# Patient Record
Sex: Male | Born: 1960 | Race: White | Hispanic: No | Smoking: Current every day smoker
Health system: Southern US, Community
[De-identification: ages and names within clinical notes are randomized; demographics above are authoritative.]

## PROBLEM LIST (undated history)

## (undated) DIAGNOSIS — S0990XA Unspecified injury of head, initial encounter: Secondary | ICD-10-CM

## (undated) DIAGNOSIS — E039 Hypothyroidism, unspecified: Secondary | ICD-10-CM

## (undated) DIAGNOSIS — J449 Chronic obstructive pulmonary disease, unspecified: Secondary | ICD-10-CM

## (undated) DIAGNOSIS — N400 Enlarged prostate without lower urinary tract symptoms: Secondary | ICD-10-CM

## (undated) DIAGNOSIS — R569 Unspecified convulsions: Secondary | ICD-10-CM

## (undated) DIAGNOSIS — B1920 Unspecified viral hepatitis C without hepatic coma: Secondary | ICD-10-CM

## (undated) DIAGNOSIS — F419 Anxiety disorder, unspecified: Secondary | ICD-10-CM

## (undated) DIAGNOSIS — F329 Major depressive disorder, single episode, unspecified: Secondary | ICD-10-CM

## (undated) DIAGNOSIS — F32A Depression, unspecified: Secondary | ICD-10-CM

## (undated) DIAGNOSIS — K861 Other chronic pancreatitis: Secondary | ICD-10-CM

## (undated) DIAGNOSIS — K7460 Unspecified cirrhosis of liver: Secondary | ICD-10-CM

## (undated) DIAGNOSIS — R0602 Shortness of breath: Secondary | ICD-10-CM

## (undated) DIAGNOSIS — IMO0002 Reserved for concepts with insufficient information to code with codable children: Secondary | ICD-10-CM

## (undated) DIAGNOSIS — R0902 Hypoxemia: Secondary | ICD-10-CM

## (undated) DIAGNOSIS — K219 Gastro-esophageal reflux disease without esophagitis: Secondary | ICD-10-CM

## (undated) DIAGNOSIS — F1010 Alcohol abuse, uncomplicated: Secondary | ICD-10-CM

## (undated) DIAGNOSIS — F319 Bipolar disorder, unspecified: Secondary | ICD-10-CM

## (undated) HISTORY — PX: APPENDECTOMY: SHX54

## (undated) HISTORY — PX: HERNIA REPAIR: SHX51

## (undated) HISTORY — PX: TOTAL HIP ARTHROPLASTY: SHX124

## (undated) HISTORY — DX: Unspecified viral hepatitis C without hepatic coma: B19.20

## (undated) HISTORY — DX: Alcohol abuse, uncomplicated: F10.10

## (undated) HISTORY — PX: FEMUR FRACTURE SURGERY: SHX633

## (undated) SURGERY — OPEN REDUCTION INTERNAL FIXATION (ORIF) TIBIAL PLATEAU
Anesthesia: General | Laterality: Left

---

## 2001-02-24 ENCOUNTER — Other Ambulatory Visit: Admission: RE | Admit: 2001-02-24 | Discharge: 2001-02-24 | Payer: Self-pay | Admitting: Urology

## 2001-04-27 ENCOUNTER — Ambulatory Visit (HOSPITAL_COMMUNITY): Admission: RE | Admit: 2001-04-27 | Discharge: 2001-04-27 | Payer: Self-pay | Admitting: Pulmonary Disease

## 2001-07-13 ENCOUNTER — Emergency Department (HOSPITAL_COMMUNITY): Admission: EM | Admit: 2001-07-13 | Discharge: 2001-07-14 | Payer: Self-pay | Admitting: Emergency Medicine

## 2001-07-14 ENCOUNTER — Encounter: Payer: Self-pay | Admitting: Emergency Medicine

## 2001-10-14 ENCOUNTER — Ambulatory Visit (HOSPITAL_COMMUNITY): Admission: RE | Admit: 2001-10-14 | Discharge: 2001-10-14 | Payer: Self-pay | Admitting: Pulmonary Disease

## 2001-10-19 ENCOUNTER — Ambulatory Visit (HOSPITAL_COMMUNITY): Admission: RE | Admit: 2001-10-19 | Discharge: 2001-10-19 | Payer: Self-pay | Admitting: Pulmonary Disease

## 2002-01-30 ENCOUNTER — Encounter: Payer: Self-pay | Admitting: Emergency Medicine

## 2002-01-30 ENCOUNTER — Inpatient Hospital Stay (HOSPITAL_COMMUNITY): Admission: EM | Admit: 2002-01-30 | Discharge: 2002-01-30 | Payer: Self-pay | Admitting: Emergency Medicine

## 2002-05-26 ENCOUNTER — Emergency Department (HOSPITAL_COMMUNITY): Admission: EM | Admit: 2002-05-26 | Discharge: 2002-05-26 | Payer: Self-pay | Admitting: *Deleted

## 2002-05-30 ENCOUNTER — Encounter: Payer: Self-pay | Admitting: Internal Medicine

## 2002-05-31 ENCOUNTER — Inpatient Hospital Stay (HOSPITAL_COMMUNITY): Admission: EM | Admit: 2002-05-31 | Discharge: 2002-06-01 | Payer: Self-pay | Admitting: Internal Medicine

## 2002-06-08 ENCOUNTER — Inpatient Hospital Stay (HOSPITAL_COMMUNITY): Admission: EM | Admit: 2002-06-08 | Discharge: 2002-06-09 | Payer: Self-pay | Admitting: *Deleted

## 2002-06-08 ENCOUNTER — Encounter: Payer: Self-pay | Admitting: Emergency Medicine

## 2002-09-17 ENCOUNTER — Emergency Department (HOSPITAL_COMMUNITY): Admission: EM | Admit: 2002-09-17 | Discharge: 2002-09-18 | Payer: Self-pay | Admitting: *Deleted

## 2002-09-18 ENCOUNTER — Encounter: Payer: Self-pay | Admitting: *Deleted

## 2002-12-14 ENCOUNTER — Encounter: Payer: Self-pay | Admitting: Emergency Medicine

## 2002-12-14 ENCOUNTER — Emergency Department (HOSPITAL_COMMUNITY): Admission: EM | Admit: 2002-12-14 | Discharge: 2002-12-14 | Payer: Self-pay | Admitting: Emergency Medicine

## 2003-07-01 ENCOUNTER — Ambulatory Visit (HOSPITAL_COMMUNITY): Admission: RE | Admit: 2003-07-01 | Discharge: 2003-07-01 | Payer: Self-pay | Admitting: General Surgery

## 2003-08-11 ENCOUNTER — Ambulatory Visit (HOSPITAL_COMMUNITY): Admission: RE | Admit: 2003-08-11 | Discharge: 2003-08-11 | Payer: Self-pay | Admitting: Pulmonary Disease

## 2003-12-08 ENCOUNTER — Other Ambulatory Visit: Admission: RE | Admit: 2003-12-08 | Discharge: 2003-12-08 | Payer: Self-pay | Admitting: Urology

## 2003-12-17 ENCOUNTER — Emergency Department (HOSPITAL_COMMUNITY): Admission: EM | Admit: 2003-12-17 | Discharge: 2003-12-17 | Payer: Self-pay | Admitting: Emergency Medicine

## 2004-03-16 ENCOUNTER — Ambulatory Visit (HOSPITAL_COMMUNITY): Admission: RE | Admit: 2004-03-16 | Discharge: 2004-03-16 | Payer: Self-pay | Admitting: General Surgery

## 2004-10-08 ENCOUNTER — Ambulatory Visit (HOSPITAL_COMMUNITY): Admission: RE | Admit: 2004-10-08 | Discharge: 2004-10-08 | Payer: Self-pay | Admitting: Pulmonary Disease

## 2005-06-27 ENCOUNTER — Ambulatory Visit (HOSPITAL_COMMUNITY): Admission: RE | Admit: 2005-06-27 | Discharge: 2005-06-27 | Payer: Self-pay | Admitting: Pulmonary Disease

## 2005-07-19 ENCOUNTER — Emergency Department (HOSPITAL_COMMUNITY): Admission: EM | Admit: 2005-07-19 | Discharge: 2005-07-19 | Payer: Self-pay | Admitting: Emergency Medicine

## 2006-01-02 ENCOUNTER — Ambulatory Visit (HOSPITAL_COMMUNITY): Admission: RE | Admit: 2006-01-02 | Discharge: 2006-01-02 | Payer: Self-pay | Admitting: Pulmonary Disease

## 2006-11-06 ENCOUNTER — Emergency Department (HOSPITAL_COMMUNITY): Admission: EM | Admit: 2006-11-06 | Discharge: 2006-11-06 | Payer: Self-pay | Admitting: Emergency Medicine

## 2006-11-11 ENCOUNTER — Ambulatory Visit (HOSPITAL_COMMUNITY): Admission: RE | Admit: 2006-11-11 | Discharge: 2006-11-11 | Payer: Self-pay | Admitting: Pulmonary Disease

## 2006-11-16 ENCOUNTER — Emergency Department (HOSPITAL_COMMUNITY): Admission: EM | Admit: 2006-11-16 | Discharge: 2006-11-16 | Payer: Self-pay | Admitting: Emergency Medicine

## 2006-11-30 ENCOUNTER — Emergency Department (HOSPITAL_COMMUNITY): Admission: EM | Admit: 2006-11-30 | Discharge: 2006-11-30 | Payer: Self-pay | Admitting: Emergency Medicine

## 2006-12-25 ENCOUNTER — Ambulatory Visit (HOSPITAL_COMMUNITY): Admission: RE | Admit: 2006-12-25 | Discharge: 2006-12-25 | Payer: Self-pay | Admitting: Internal Medicine

## 2007-01-26 ENCOUNTER — Emergency Department (HOSPITAL_COMMUNITY): Admission: EM | Admit: 2007-01-26 | Discharge: 2007-01-26 | Payer: Self-pay | Admitting: Emergency Medicine

## 2007-02-11 ENCOUNTER — Ambulatory Visit: Payer: Self-pay | Admitting: Internal Medicine

## 2007-02-16 ENCOUNTER — Ambulatory Visit: Payer: Self-pay | Admitting: Internal Medicine

## 2007-02-16 ENCOUNTER — Ambulatory Visit (HOSPITAL_COMMUNITY): Admission: RE | Admit: 2007-02-16 | Discharge: 2007-02-16 | Payer: Self-pay | Admitting: Internal Medicine

## 2007-02-22 ENCOUNTER — Emergency Department (HOSPITAL_COMMUNITY): Admission: EM | Admit: 2007-02-22 | Discharge: 2007-02-22 | Payer: Self-pay | Admitting: Emergency Medicine

## 2007-02-23 ENCOUNTER — Emergency Department (HOSPITAL_COMMUNITY): Admission: EM | Admit: 2007-02-23 | Discharge: 2007-02-23 | Payer: Self-pay | Admitting: Emergency Medicine

## 2007-03-10 ENCOUNTER — Ambulatory Visit: Payer: Self-pay | Admitting: Internal Medicine

## 2007-03-30 ENCOUNTER — Inpatient Hospital Stay (HOSPITAL_COMMUNITY): Admission: RE | Admit: 2007-03-30 | Discharge: 2007-04-04 | Payer: Self-pay | Admitting: Orthopedic Surgery

## 2007-04-11 ENCOUNTER — Inpatient Hospital Stay (HOSPITAL_COMMUNITY): Admission: EM | Admit: 2007-04-11 | Discharge: 2007-04-15 | Payer: Self-pay | Admitting: Emergency Medicine

## 2007-04-13 ENCOUNTER — Ambulatory Visit: Payer: Self-pay | Admitting: Vascular Surgery

## 2007-04-13 ENCOUNTER — Encounter (INDEPENDENT_AMBULATORY_CARE_PROVIDER_SITE_OTHER): Payer: Self-pay | Admitting: Internal Medicine

## 2007-04-17 ENCOUNTER — Encounter: Payer: Self-pay | Admitting: Emergency Medicine

## 2007-04-17 ENCOUNTER — Inpatient Hospital Stay (HOSPITAL_COMMUNITY): Admission: EM | Admit: 2007-04-17 | Discharge: 2007-04-19 | Payer: Self-pay | Admitting: Emergency Medicine

## 2007-04-18 ENCOUNTER — Encounter (INDEPENDENT_AMBULATORY_CARE_PROVIDER_SITE_OTHER): Payer: Self-pay | Admitting: Specialist

## 2007-05-01 ENCOUNTER — Inpatient Hospital Stay (HOSPITAL_COMMUNITY): Admission: EM | Admit: 2007-05-01 | Discharge: 2007-05-01 | Payer: Self-pay | Admitting: Emergency Medicine

## 2007-05-28 ENCOUNTER — Observation Stay (HOSPITAL_COMMUNITY): Admission: EM | Admit: 2007-05-28 | Discharge: 2007-05-29 | Payer: Self-pay | Admitting: Emergency Medicine

## 2007-06-11 ENCOUNTER — Ambulatory Visit: Payer: Self-pay | Admitting: Internal Medicine

## 2007-08-07 ENCOUNTER — Ambulatory Visit (HOSPITAL_COMMUNITY): Admission: RE | Admit: 2007-08-07 | Discharge: 2007-08-07 | Payer: Self-pay | Admitting: Pulmonary Disease

## 2007-08-17 ENCOUNTER — Inpatient Hospital Stay (HOSPITAL_COMMUNITY): Admission: EM | Admit: 2007-08-17 | Discharge: 2007-08-18 | Payer: Self-pay | Admitting: Emergency Medicine

## 2007-08-19 ENCOUNTER — Inpatient Hospital Stay (HOSPITAL_COMMUNITY): Admission: EM | Admit: 2007-08-19 | Discharge: 2007-08-25 | Payer: Self-pay | Admitting: Emergency Medicine

## 2007-12-09 ENCOUNTER — Ambulatory Visit (HOSPITAL_COMMUNITY): Admission: RE | Admit: 2007-12-09 | Discharge: 2007-12-09 | Payer: Self-pay | Admitting: Internal Medicine

## 2007-12-30 ENCOUNTER — Inpatient Hospital Stay (HOSPITAL_COMMUNITY): Admission: AD | Admit: 2007-12-30 | Discharge: 2008-01-03 | Payer: Self-pay | Admitting: Pulmonary Disease

## 2007-12-30 ENCOUNTER — Other Ambulatory Visit: Payer: Self-pay | Admitting: General Surgery

## 2008-01-01 ENCOUNTER — Encounter (INDEPENDENT_AMBULATORY_CARE_PROVIDER_SITE_OTHER): Payer: Self-pay | Admitting: General Surgery

## 2008-01-18 ENCOUNTER — Observation Stay (HOSPITAL_COMMUNITY): Admission: EM | Admit: 2008-01-18 | Discharge: 2008-01-19 | Payer: Self-pay | Admitting: Emergency Medicine

## 2008-01-19 ENCOUNTER — Ambulatory Visit: Payer: Self-pay | Admitting: Gastroenterology

## 2008-01-25 ENCOUNTER — Ambulatory Visit: Payer: Self-pay | Admitting: Internal Medicine

## 2008-02-15 ENCOUNTER — Ambulatory Visit: Payer: Self-pay | Admitting: Gastroenterology

## 2008-02-16 ENCOUNTER — Ambulatory Visit (HOSPITAL_COMMUNITY): Admission: RE | Admit: 2008-02-16 | Discharge: 2008-02-16 | Payer: Self-pay | Admitting: Internal Medicine

## 2008-02-17 ENCOUNTER — Encounter (HOSPITAL_COMMUNITY): Admission: RE | Admit: 2008-02-17 | Discharge: 2008-03-18 | Payer: Self-pay | Admitting: Pulmonary Disease

## 2008-03-08 ENCOUNTER — Ambulatory Visit (HOSPITAL_COMMUNITY): Admission: RE | Admit: 2008-03-08 | Discharge: 2008-03-08 | Payer: Self-pay | Admitting: Internal Medicine

## 2008-03-09 ENCOUNTER — Encounter: Payer: Self-pay | Admitting: Internal Medicine

## 2008-03-09 ENCOUNTER — Ambulatory Visit (HOSPITAL_COMMUNITY): Admission: RE | Admit: 2008-03-09 | Discharge: 2008-03-09 | Payer: Self-pay | Admitting: Internal Medicine

## 2008-03-09 ENCOUNTER — Ambulatory Visit: Payer: Self-pay | Admitting: Internal Medicine

## 2008-03-14 ENCOUNTER — Encounter: Payer: Self-pay | Admitting: Gastroenterology

## 2008-03-14 DIAGNOSIS — K861 Other chronic pancreatitis: Secondary | ICD-10-CM | POA: Insufficient documentation

## 2008-04-07 ENCOUNTER — Ambulatory Visit: Payer: Self-pay | Admitting: Gastroenterology

## 2008-04-07 ENCOUNTER — Ambulatory Visit (HOSPITAL_COMMUNITY): Admission: RE | Admit: 2008-04-07 | Discharge: 2008-04-07 | Payer: Self-pay | Admitting: Gastroenterology

## 2008-04-26 ENCOUNTER — Emergency Department (HOSPITAL_COMMUNITY): Admission: EM | Admit: 2008-04-26 | Discharge: 2008-04-26 | Payer: Self-pay | Admitting: Emergency Medicine

## 2008-05-06 ENCOUNTER — Encounter: Payer: Self-pay | Admitting: Gastroenterology

## 2008-05-11 ENCOUNTER — Encounter: Payer: Self-pay | Admitting: Gastroenterology

## 2008-05-11 ENCOUNTER — Ambulatory Visit: Payer: Self-pay | Admitting: Internal Medicine

## 2008-05-11 LAB — CONVERTED CEMR LAB
ALT: 29 units/L (ref 0–53)
Albumin: 3.8 g/dL (ref 3.5–5.2)
CO2: 27 meq/L (ref 19–32)
Calcium: 9.6 mg/dL (ref 8.4–10.5)
Chloride: 99 meq/L (ref 96–112)
Glucose, Bld: 112 mg/dL — ABNORMAL HIGH (ref 70–99)
Potassium: 4.2 meq/L (ref 3.5–5.3)
Sodium: 138 meq/L (ref 135–145)
TSH: 0.125 microintl units/mL — ABNORMAL LOW (ref 0.350–4.50)
Total Bilirubin: 0.6 mg/dL (ref 0.3–1.2)
Total Protein: 7.2 g/dL (ref 6.0–8.3)

## 2008-05-12 ENCOUNTER — Encounter: Payer: Self-pay | Admitting: Gastroenterology

## 2008-05-12 LAB — CONVERTED CEMR LAB: Free T4: 1.46 ng/dL (ref 0.89–1.80)

## 2008-06-21 ENCOUNTER — Ambulatory Visit (HOSPITAL_COMMUNITY): Admission: RE | Admit: 2008-06-21 | Discharge: 2008-06-21 | Payer: Self-pay | Admitting: Pulmonary Disease

## 2008-08-11 ENCOUNTER — Ambulatory Visit (HOSPITAL_COMMUNITY): Admission: RE | Admit: 2008-08-11 | Discharge: 2008-08-11 | Payer: Self-pay | Admitting: General Surgery

## 2009-02-28 IMAGING — US US ABDOMEN COMPLETE
1 series · 14 of 25 positions shown · non-contrast
Comparison: none

HISTORY: Cirrhosis, nausea, vomiting

ULTRASOUND ABDOMEN COMPLETE:
TECHNIQUE: Complete abdominal ultrasound examination was performed including
evaluation of the liver, gallbladder, bile ducts, pancreas, kidneys, spleen,
IVC, and abdominal aorta.

[Series 1: us abdomen complete · 0.32mm/px · 14 of 83 slices shown]
[im 1/83]
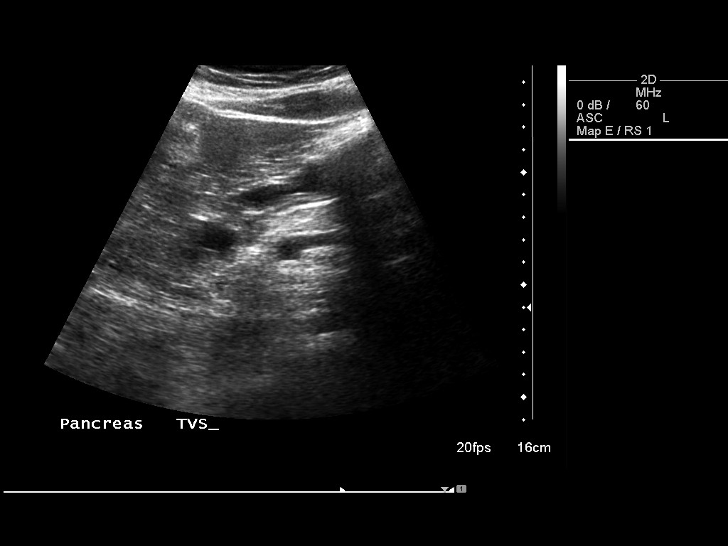
[im 7/83]
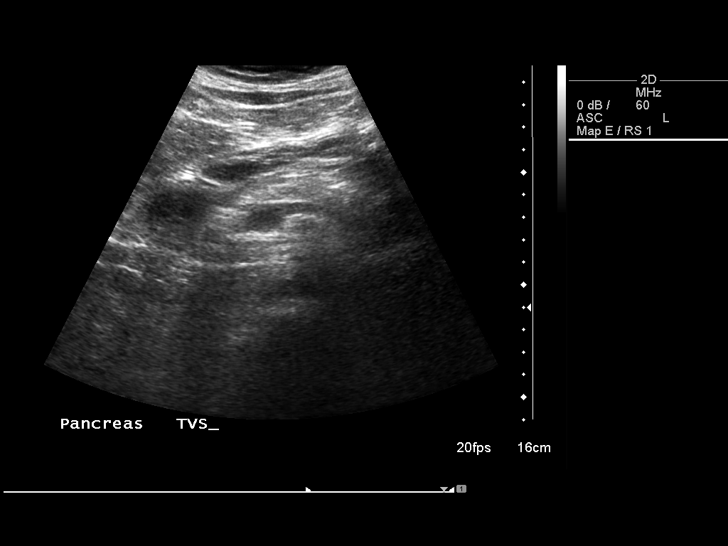
[im 14/83]
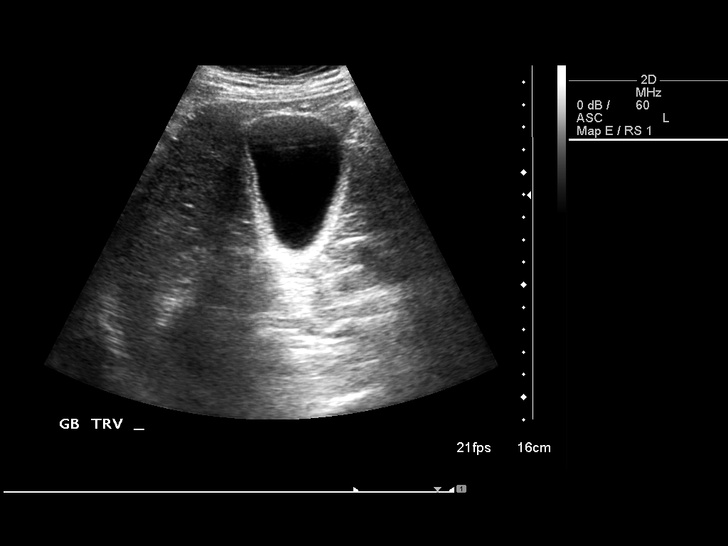
[im 21/83]
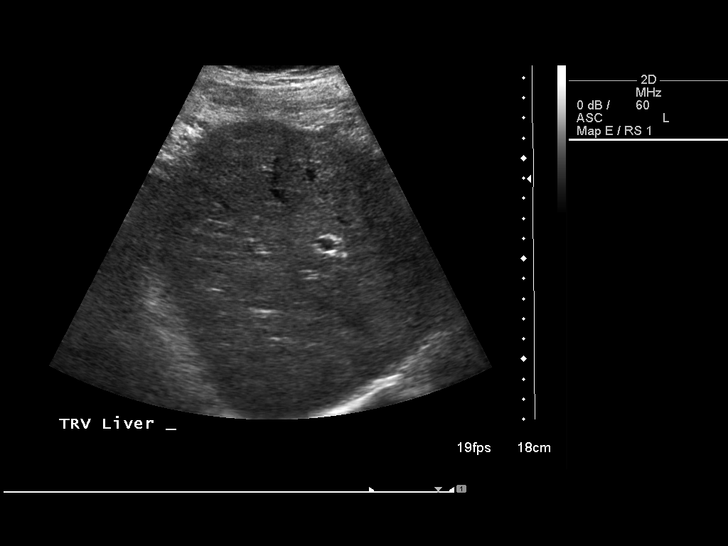
[im 28/83]
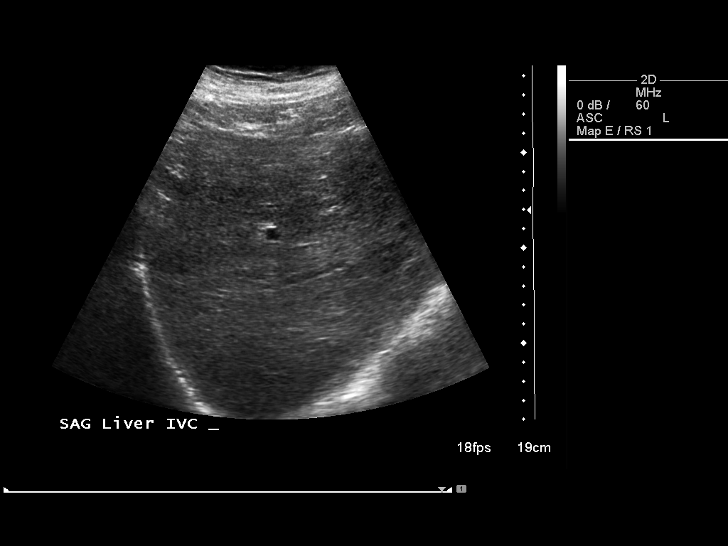
[im 31/83]
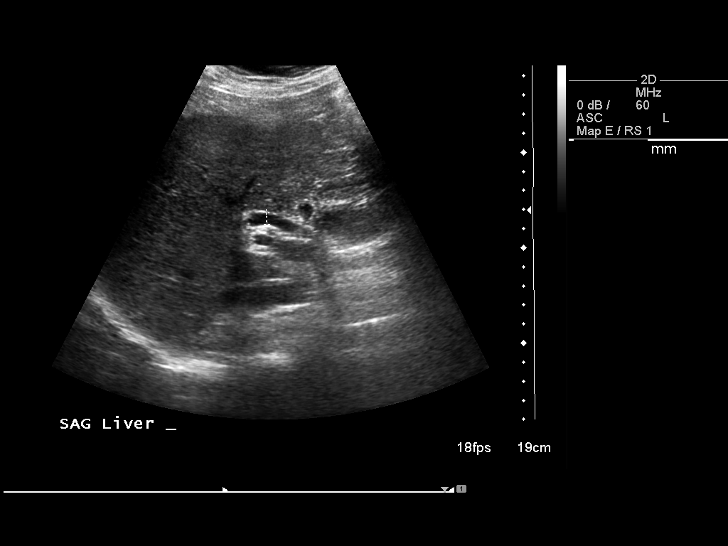
[im 38/83]
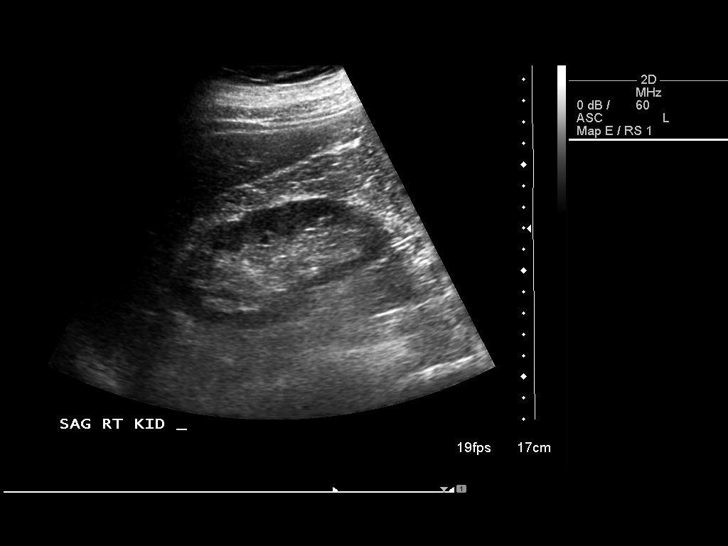
[im 45/83]
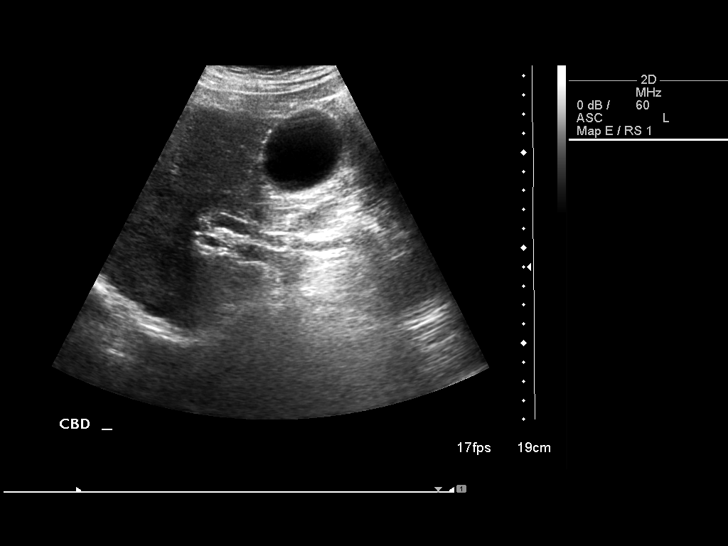
[im 52/83]
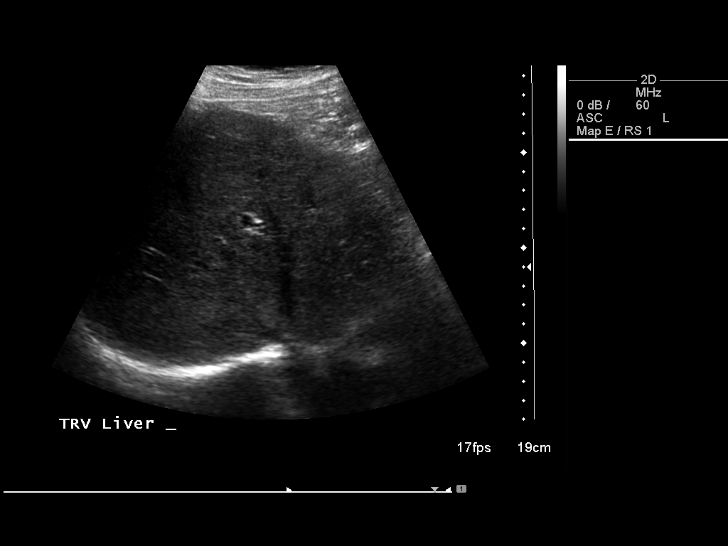
[im 55/83]
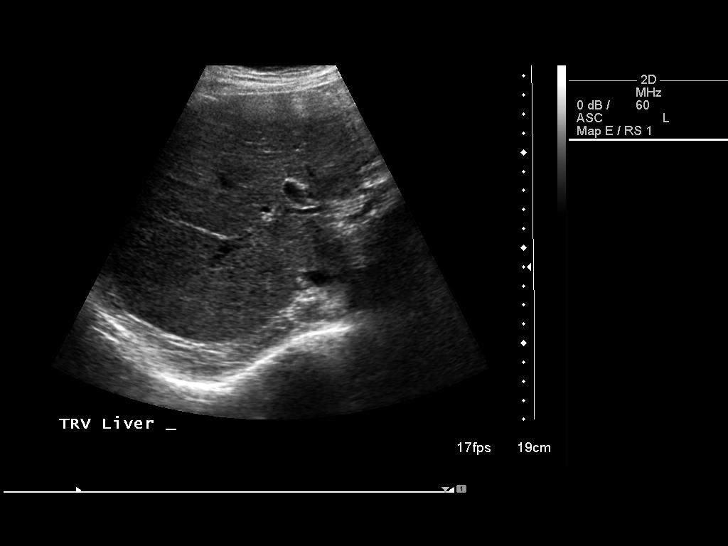
[im 62/83]
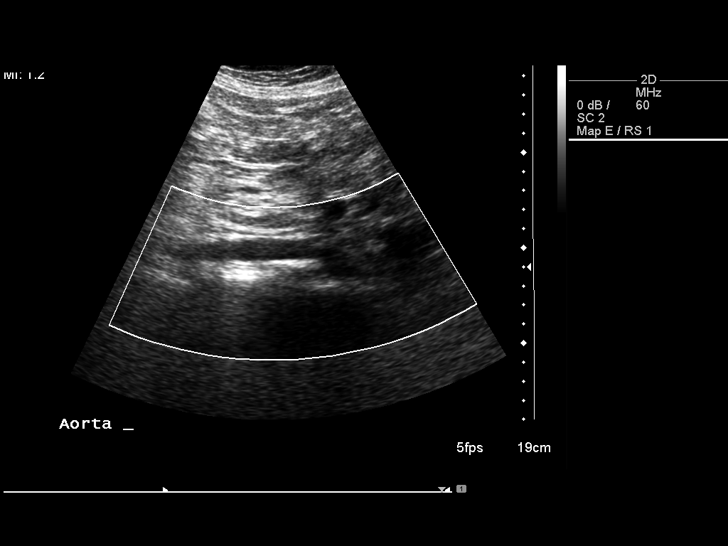
[im 69/83]
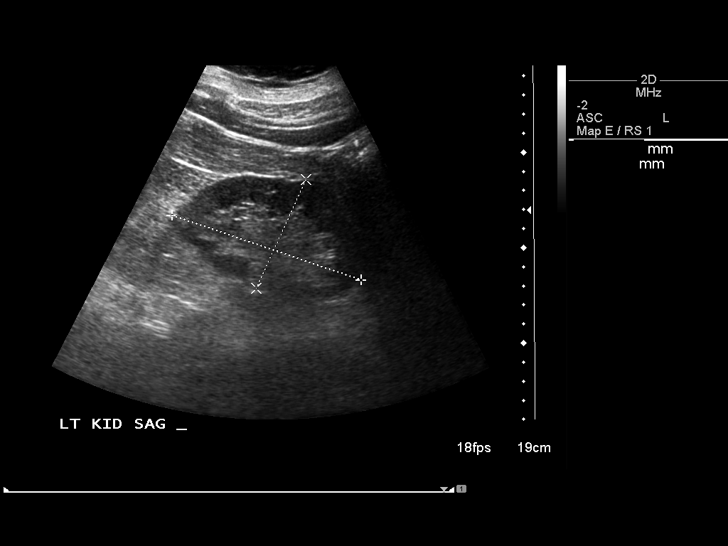
[im 76/83]
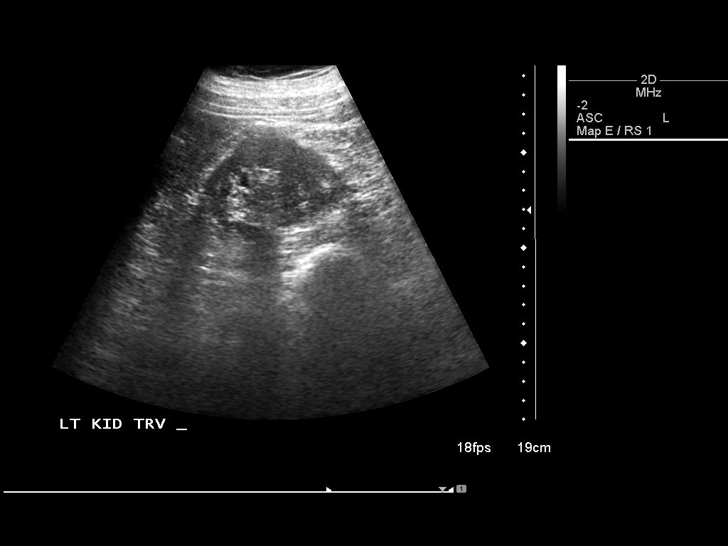
[im 83/83]
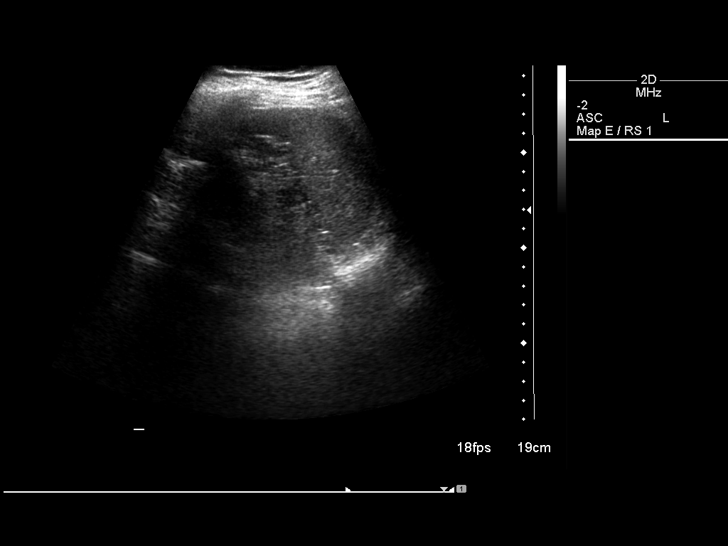

[14 of 25 positions shown; findings below may reference images not displayed]

Shadowing gallstone in gallbladder.
Gallbladder wall minimally prominent at 3 to 4 mm diameter.
No sonographic Murphy sign or definite pericholecystic fluid.
Common bile duct dilated at 8 mm diameter.
Minimal central intrahepatic biliary ductal dilatation.
Remainder of liver, pancreas, and spleen normal appearance, spleen 8.5 cm
length.
Kidneys normal size and morphology, 10.2 cm length right and 10.4 cm length
left.
Aorta and IVC normal.
No free fluid.
IMPRESSION: Cholelithiasis and minimal gallbladder wall prominence, without definite signs
of acute cholecystitis.
Mild extrahepatic and central intrahepatic biliary dilatation, recommend
correlation with LFTs.

## 2009-04-05 IMAGING — CR DG HIP COMPLETE 2+V*R*
4 series · 4 of 4 positions shown · non-contrast
Comparison: none

CLINICAL DATA: Preop total hip, osteoarthritis.
 RIGHT HIP - 4 VIEW:

[t pelvis a.p. (1 of 2)]
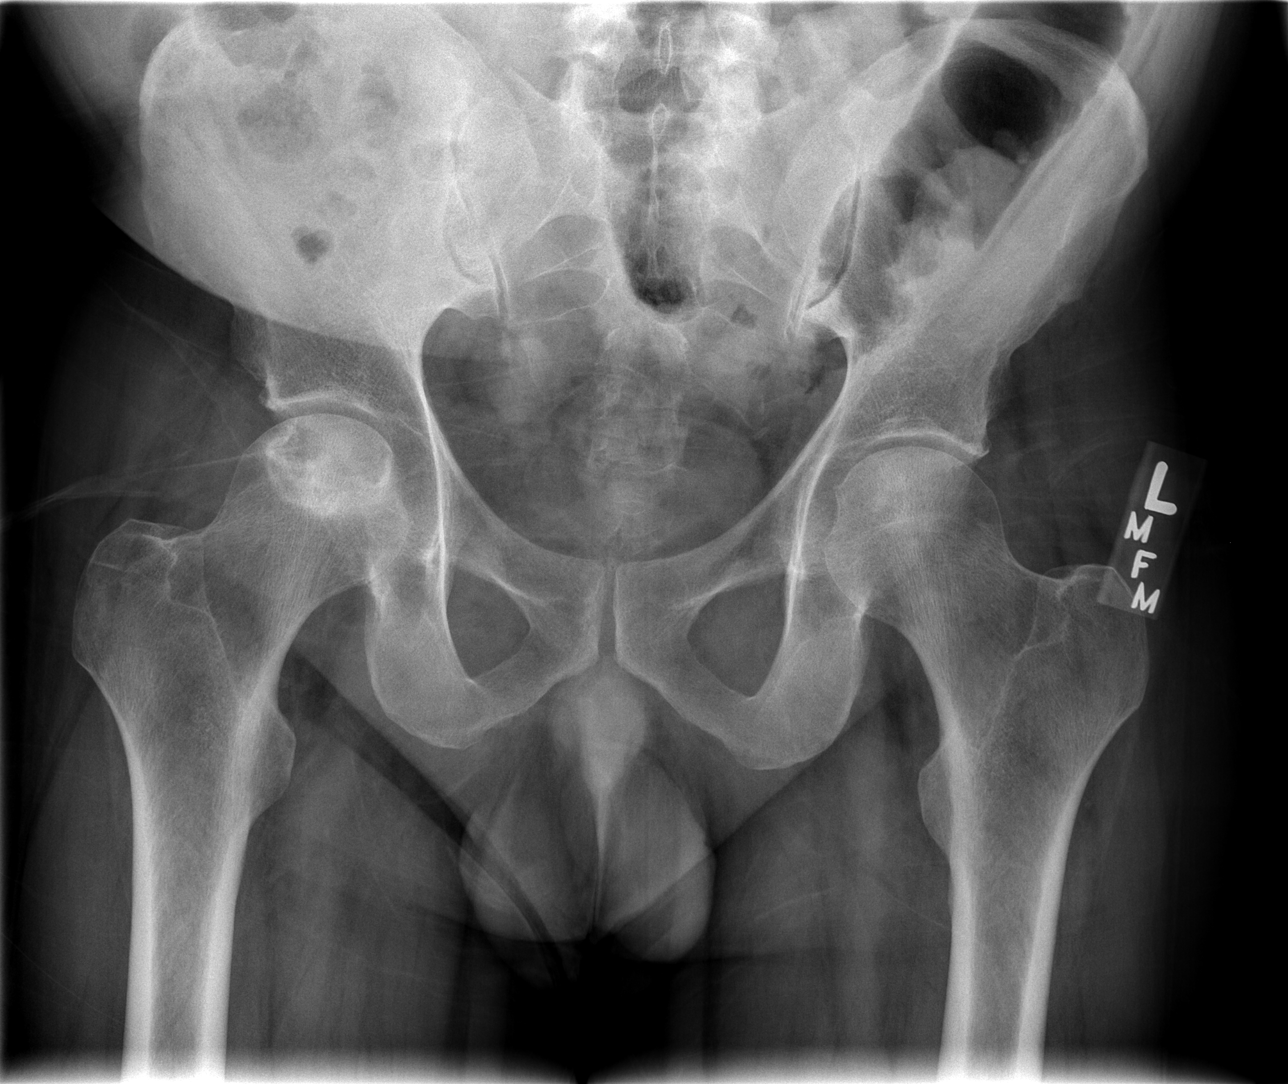

[t pelvis a.p. (2 of 2)]
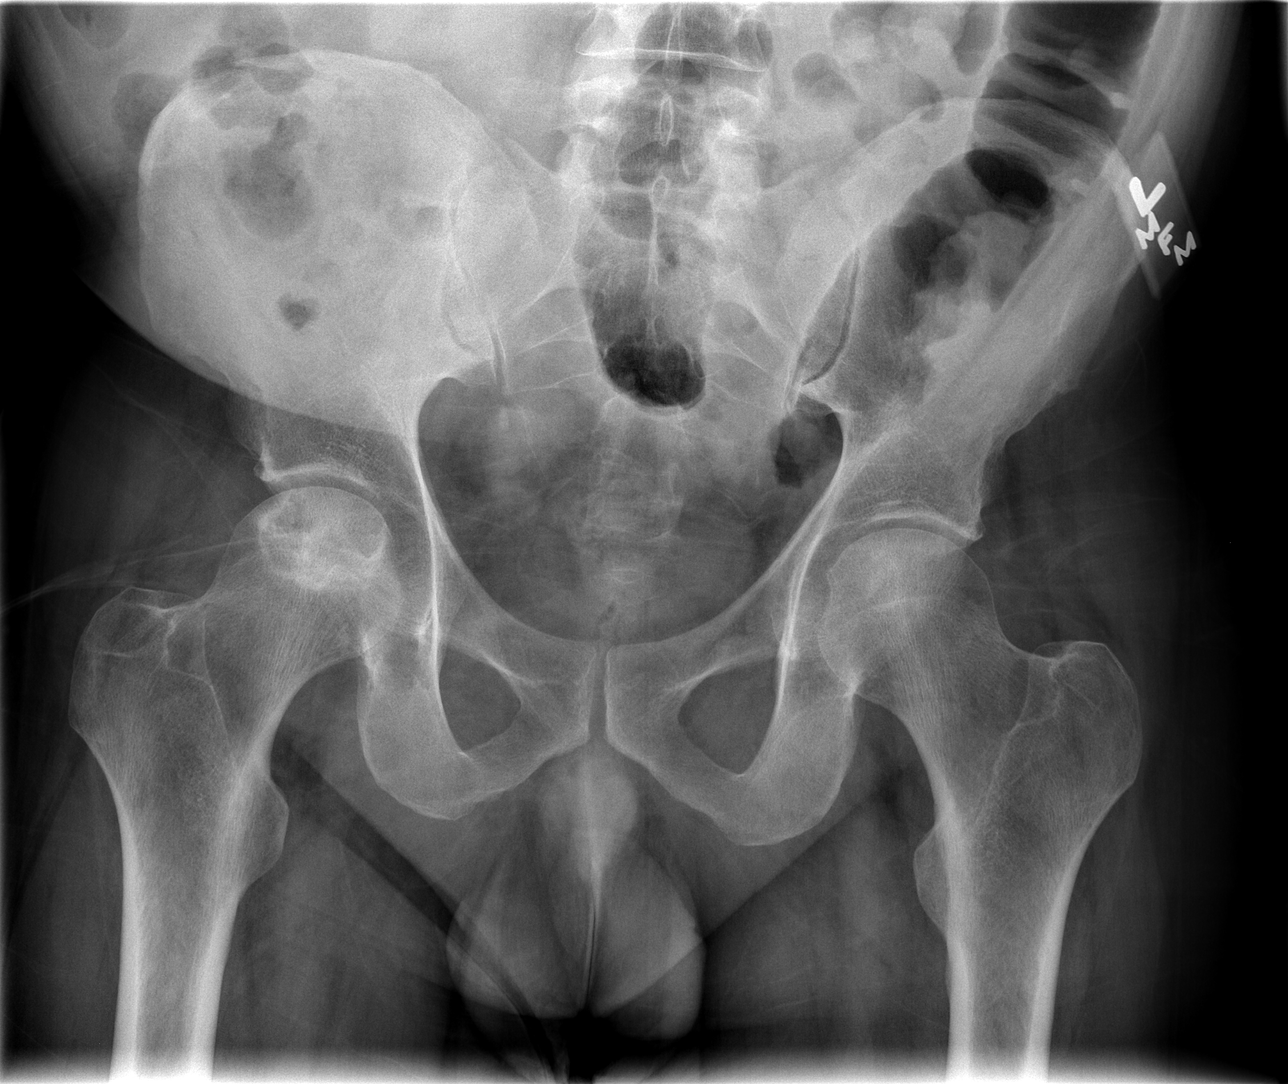

[t hip ap right]
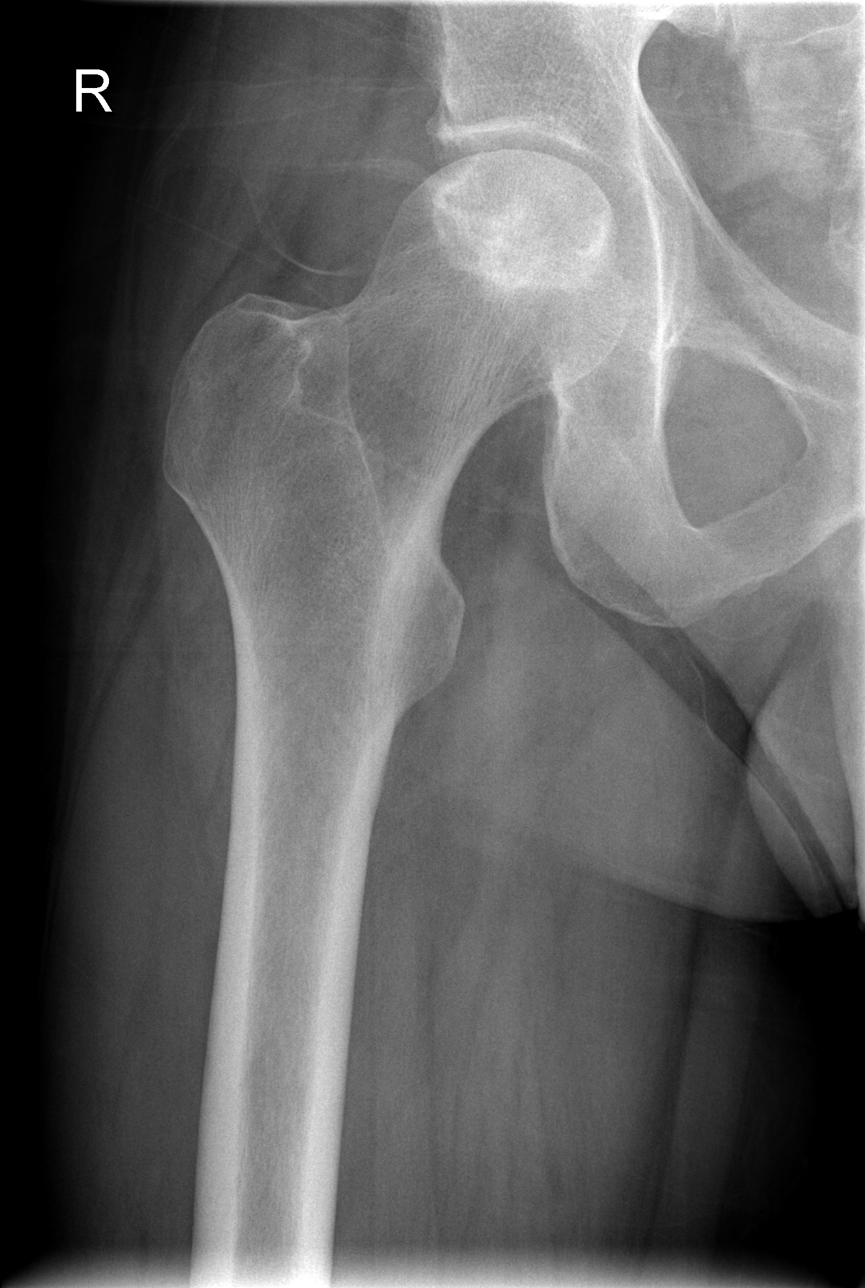

[t hip frog leg right]
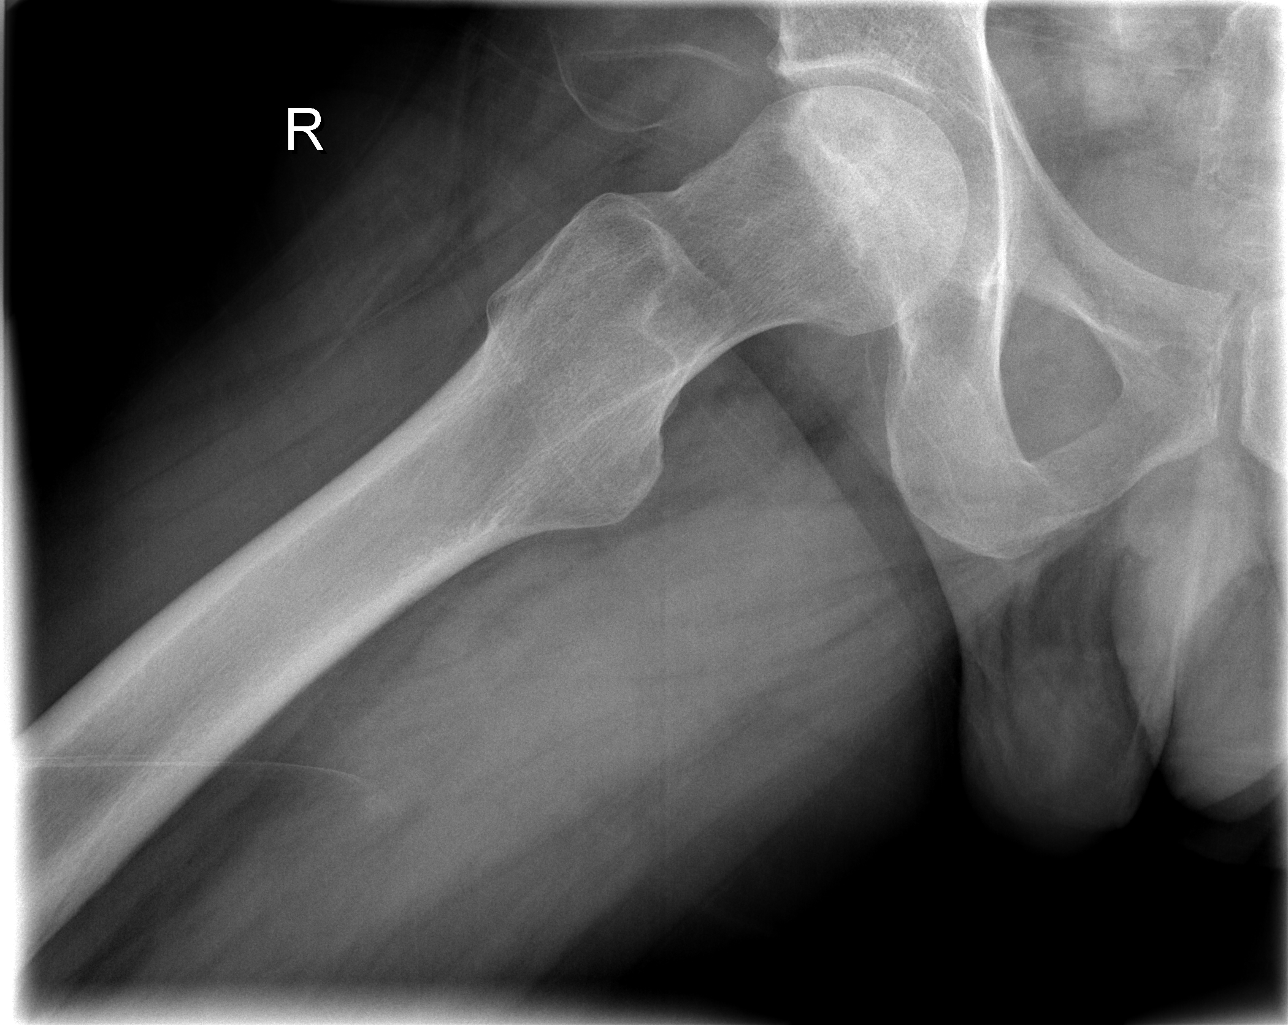

[4 of 4 positions shown; findings below may reference images not displayed]

FINDINGS: There is increased density and slight flattening of the right femoral head consistent with avascular necrosis.  Joint space is fairly well preserved.  Left hip appears normal.  Bone of the pelvis are unremarkable.
IMPRESSION: Findings consistent with advanced avascular necrosis right hip.

## 2009-06-13 ENCOUNTER — Ambulatory Visit (HOSPITAL_COMMUNITY): Admission: RE | Admit: 2009-06-13 | Discharge: 2009-06-13 | Payer: Self-pay | Admitting: Pulmonary Disease

## 2010-02-06 ENCOUNTER — Other Ambulatory Visit
Admission: RE | Admit: 2010-02-06 | Discharge: 2010-02-06 | Payer: Self-pay | Source: Home / Self Care | Admitting: Urology

## 2010-07-03 ENCOUNTER — Ambulatory Visit (HOSPITAL_COMMUNITY)
Admission: RE | Admit: 2010-07-03 | Discharge: 2010-07-03 | Payer: Self-pay | Source: Home / Self Care | Attending: Pulmonary Disease | Admitting: Pulmonary Disease

## 2010-07-10 ENCOUNTER — Ambulatory Visit (HOSPITAL_COMMUNITY)
Admission: RE | Admit: 2010-07-10 | Discharge: 2010-07-10 | Payer: Self-pay | Source: Home / Self Care | Attending: Urology | Admitting: Urology

## 2010-07-11 LAB — CREATININE, SERUM
Creatinine, Ser: 0.87 mg/dL (ref 0.4–1.5)
GFR calc Af Amer: >60 mL/min (ref >60)
GFR calc non Af Amer: >60 mL/min (ref >60)

## 2010-07-11 LAB — BUN: BUN: 3 mg/dL — ABNORMAL LOW (ref 6–23)

## 2010-07-25 ENCOUNTER — Other Ambulatory Visit (HOSPITAL_COMMUNITY): Payer: Self-pay | Admitting: Urology

## 2010-07-25 DIAGNOSIS — N281 Cyst of kidney, acquired: Secondary | ICD-10-CM

## 2010-07-30 ENCOUNTER — Inpatient Hospital Stay (HOSPITAL_COMMUNITY)
Admission: RE | Admit: 2010-07-30 | Discharge: 2010-07-30 | Payer: Self-pay | Source: Ambulatory Visit | Attending: Urology | Admitting: Urology

## 2010-07-30 ENCOUNTER — Other Ambulatory Visit (HOSPITAL_COMMUNITY): Payer: Self-pay | Admitting: Urology

## 2010-08-03 ENCOUNTER — Ambulatory Visit (HOSPITAL_COMMUNITY): Admission: RE | Admit: 2010-08-03 | Payer: Medicaid Other | Source: Ambulatory Visit

## 2010-08-03 ENCOUNTER — Ambulatory Visit (HOSPITAL_COMMUNITY)
Admission: RE | Admit: 2010-08-03 | Discharge: 2010-08-03 | Disposition: A | Discharge status: Home / Self Care | Payer: Medicaid Other | Source: Ambulatory Visit | Attending: Urology | Admitting: Urology

## 2010-08-03 DIAGNOSIS — K7689 Other specified diseases of liver: Secondary | ICD-10-CM | POA: Insufficient documentation

## 2010-08-03 DIAGNOSIS — Q619 Cystic kidney disease, unspecified: Secondary | ICD-10-CM | POA: Insufficient documentation

## 2010-08-03 DIAGNOSIS — N281 Cyst of kidney, acquired: Secondary | ICD-10-CM

## 2010-08-03 MED ORDER — GADOBENATE DIMEGLUMINE 529 MG/ML IV SOLN
15.0000 mL | Freq: Once | INTRAVENOUS | Status: AC | PRN
  Administered 2010-08-03: 15 mL via INTRAVENOUS

## 2010-11-06 NOTE — Op Note (Signed)
[name], [name]               ACCOUNT NO.:  [id number]   MEDICAL RECORD NO.:  [id number]          PATIENT TYPE:  INP   LOCATION:  1508                         FACILITY:  [facility]   PHYSICIAN:  Georges Lynch. Gioffre, M.D.DATE OF BIRTH:  Jan 04, 1961   DATE OF PROCEDURE:  DATE OF DISCHARGE:  05/01/2007                               OPERATIVE REPORT   I got called here around midnight to see [name].  His family called  and said that he got up and he cannot move now and he got up from a  chair and now he could not move.  He had a previous total hip  arthroplasty October 6 by Dr. Lequita Halt and then following that had  evacuation of a hematoma of his hip on the right.  I told the family to  bring him by ambulance and I would meet him here in the emergency room.  I did.  We took him to after an x-ray revealed that he had what appeared  to be a straight superior dislocation of his hip on the right.  He had  no internal or external rotation.  His leg was just markedly shortened  on the right compared the left and he was in pain.  There was no  fracture noted on the x-ray, just a simple dislocation.   We took him to the operating room with the diagnosis of a superior  dislocation of the right total hip.   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   OPERATION:  Closed reduction of the right total hip dislocation under  general anesthesia.   PROCEDURE:  Under general anesthesia, a gentle closed manipulation of  his right hip was carried out in the usual fashion.  An x-ray was taken  that showed anatomical reduction.  The prostheses were all well-located  and there was no fracture, so I then placed him in a knee immobilizer  with a pillow between his legs, admitted him to the hospital, and will  have Dr. Lequita Halt decide in the morning whether he wants to put him in a  brace or simply keep him in an   SURGEON:  Windy Fast A. Darrelyn Hillock, M.D.   ASSISTANT:  Arlyn Leak,  PA.           ______________________________  Georges Lynch. Darrelyn Hillock, M.D.     RAG/MEDQ  D:  05/01/2007  T:  05/01/2007  Job:  [id number]   cc:   Ollen Gross, M.D.  Fax: [phone]

## 2010-11-06 NOTE — Assessment & Plan Note (Signed)
NAME:  Brendan, Smith                CHART#:  [id number]   DATE:  02/15/2008                       DOB:  1960/09/22   CHIEF COMPLAINT:  Nausea, vomiting, and abdominal pain.   SUBJECTIVE:  The patient is here for a followup visit.  His mother  called last week stating that he was continued to having abdominal pain,  nausea, and vomiting.  I last saw him on 01/25/3008.  He has a history  of cirrhosis and prior heavy alcohol abuse.  His weight is down 12  pounds since we last saw him.  He states this is mostly due to the fact  he is not eating.  His lower extremities are still very swollen.  We did  start him on an Aldactone for his lower extremity edema in his last  visit.  He has only been eating soups since he got his gallbladder  removed several weeks ago.  His cholecystectomy and liver biopsy was  excellent on 01/01/2008.  He does start to drink plenty of fluids.  He  consumes about one Ensure daily.  He has about 3-4 soft bowel movements  daily.  His mother states his stools are very yellow.  He denies any  black stools or rectal bleeding.  He complains of upper abdominal  discomfort.  He denies any heartburn.  He does have frequent vomiting.   CURRENT MEDICATIONS:  1. Duragesic 3 patches every 3 days.  2. Xanax 2 mg q.i.d.  3. Cymbalta 120 mg daily.  4. Lithium 300 mg t.i.d.  5. Provera daily.  6. Oxycodone listed as 4 every 6 hours.  7. Aldactone 100 mg daily.   ALLERGIES:  Penicillin and vancomycin.   PHYSICAL EXAMINATION:  VITAL SIGNS:  Weight 155 down 4 pounds, temp  97.6, blood pressure 124/82, and pulse 80.  GENERAL:  A pleasant, alert, cooperative Caucasian gentleman, in no  acute distress.  He is accompanied by his mother.  SKIN:  Warm and dry.  No jaundice.  HEENT:  Sclerae nonicteric.  Oropharyngeal mucosa moist and pink.  LUNGS:  Clear to auscultation.  CARDIAC:  Regular rate and rhythm.  ABDOMEN:  Positive bowel sounds.  Abdomen is soft and nondistended.  He  has moderate epigastric tenderness to deep palpation.  No rebound or  guarding.  No organomegaly or masses.  No abdominal bruits or hernias.  LOWER EXTREMITIES:  A 2-3+ pitting edema slightly more on the right  side, both legs are shining and has some peeling skin.  He has a sore on  the right anterior shin with white denuded area.  Mild erythema at the  right lower extremity.   IMPRESSION:  The patient is a 50 year old gentleman with history of  cirrhosis, heavy alcohol use in the remote past, none in the last 5-6  years.  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  He did have a positive hepatitis C antibody,  however, his HCV-RNA and RIBA were both negative.  He is not immune to  either hepatitis A or B based on serologies.  His ANA was positive, but  the reflexed titer was negative.  His alkaline phosphatase has remained  elevated and we are going to get isoenzymes to delineate the source.  I  am concerned about his ongoing abdominal pain.  This is chronic in  nature, but it seems to be more of an issue according to the patient  since his gallbladder surgery.  He has not been able to eat solid foods  either.  It has been many years since he has had a CT of the abdomen.  If this is unremarkable, he may need to have an upper endoscopy for  further evaluation of his symptoms.   PLAN:  1. MET-7, alkaline phosphatase isoenzymes, and ammonia level today.  2. Hepatitis A and B vaccinations to be done through Dr. Juanetta Gosling'      office or the [facility].  3. Aciphex 20 mg daily, #20 samples provided.  4. CT of the abdomen and pelvis without renal contrast.  5. We will adjust his Aldactone based on his MET-7 results.  6. I have advised him to see Dr. Juanetta Gosling in the next 24-48 hours      regarding his lower extremity erythema.  As he does have a history      of chronic cellulitis, and we would like Dr. Juanetta Gosling to see him      since he is more  familiar with chronicity of his problems.       Tana Coast, P.A.  Electronically Signed     Kassie Mends, M.D.  Electronically Signed    LL/MEDQ  D:  02/15/2008  T:  02/15/2008  Job:  [id number]   cc:   [name], M.D.

## 2010-11-06 NOTE — Group Therapy Note (Signed)
[name], [name]               ACCOUNT NO.:  [id number]   MEDICAL RECORD NO.:  [id number]          PATIENT TYPE:  INP   LOCATION:  A338                          FACILITY:  [facility]   PHYSICIAN:  Edward L. Juanetta Gosling, M.D.DATE OF BIRTH:  1961/06/14   DATE OF PROCEDURE:  DATE OF DISCHARGE:                                 PROGRESS NOTE   PROBLEMS:  Include cirrhosis, cholecystitis, cellulitis of the legs,  chroninc obstructive pulmonary disease.   SUBJECTIVE:  [name] seems to be doing better.  He has no new  complaints.  He has not had anymore nausea.  He has not had anymore  vomiting.  He seems to be feeling better in general.   His physical exam today shows temperature is 97.7, pulse 99,  respirations 20, blood pressure 94/63, O2 sats 99% on 2 L.  His chest is  much clearer than before.  His legs look better.  Of course, have his  cholecystectomy and liver biopsy yesterday.  He has resolved his nausea.   ASSESSMENT:  I think he is overall better.   PLAN:  Plan is for him to continue on his treatments and medications.  I  do not plan to change anything today.  Timing of discharge per Dr.  Lovell Sheehan.      Edward L. Juanetta Gosling, M.D.  Electronically Signed     ELH/MEDQ  D:  01/02/2008  T:  01/02/2008  Job:  [id number]

## 2010-11-06 NOTE — Op Note (Signed)
NAME:  [name], [name]               ACCOUNT NO.:  [id number]   MEDICAL RECORD NO.:  [id number]          PATIENT TYPE:  AMB   LOCATION:  DAY                           FACILITY:  [facility]   PHYSICIAN:  R. Roetta Sessions, M.D. DATE OF BIRTH:  [birth year]   DATE OF PROCEDURE:  03/09/2008  DATE OF DISCHARGE:                               OPERATIVE REPORT   EGD with Elease Hashimoto dilation followed by colonoscopy snare polypectomy.   INDICATIONS FOR PROCEDURE:  A 50 year old gentleman with nausea,  vomiting, abdominal pain, he is status post cholecystectomy by Dr.  Lovell Sheehan, he has history of cirrhosis, distant history of heavy alcohol  abuse with recent CT suggesting some thickening of colon suggestive of  colitis, and he has also had some diarrhea, history of Schatzki's ring  dilated last by me, and no recurrent esophageal dysphagia.  EGD and  colonoscopy now being performed.  Risks, benefits, alternatives, and  limitations have been reviewed.  Potential for esophageal dilation also  reviewed.  It is noted that we had set up to perform these procedures  yesterday; however, his potassium went from 3.4 to 2.8 (yesterday).  He  was supplemented with oral potassium yesterday.  His potassium today is  3.7 and we have the green light from anesthesia.   PROCEDURE NOTE:  O2 saturation, blood pressure, pulse, and respirations  were monitored throughout the entirety of the procedure.   ANESTHESIA:  Propofol administered by anesthesia.   FINDINGS:  EGD examination of tubular esophagus revealed no mucosal  abnormalities.  EG junction easily traversed down into the stomach.  Colon:  Gastric cavity was empty and insufflated well with air.  Thorough examination of gastric mucosa including retroflexion of the  proximal stomach esophagogastric junction demonstrated a small hiatal  hernia.  The patient had antral prepyloric erosions.  Otherwise, there  were also no infiltrating processes.  No stigmata for  hypertensive  gastropathy.  Pylorus patent.  Examination of bulb and second portion  revealed the bulbar erosions and continuation of inflammatory changes  seen in the antrum with bulbar erosions present, no ulcer.  Otherwise,  D1 and D2 appeared unremarkable.  Therapeutic/diagnostic maneuvers  performed.  A 56-French Maloney dilator was passed to full insertion  with ease, but revealed no apparent complication related passage of the  dilator.  The patient tolerated the procedure well and was prepared for  colonoscopy.  Digital rectal exam revealed no abnormalities.   ENDOSCOPIC FINDINGS:  Prep was poor.  There was some viscous stool  throughout the colon, which had to be washed and suctioned to gain  adequate visualization of the mucosa.  Scope was advanced from the  rectosigmoid junction to the left transverse, right colon, appendiceal  orifice, ileocecal valve, and cecum.  These structures were well seen  and photographed for the record.  I attempted to, but I was unable to  intubate the terminal ileum.  The patient had a polyp in the ascending  colon about 8 mm in dimensions.  It was hot snared and recovered through  the scope.  There was a second polyp at the splenic  flexure, which was  cold snared and a pedunculated polyp in the mid sigmoid, which was hot  snared and recovered through the scope.  The remainder of colonic mucosa  appeared normal.  Stool samples obtained for microbiologist.  Scope was  pulled down the rectum, where thorough examination of the rectal mucosa  including retroflexed view of the anal verge demonstrated no  abnormalities.  The patient tolerated the procedure well and was  reactive in Endoscopy.   IMPRESSION:  EGD, normal esophagus status post passage of the [facility]  dilator empirically, small hiatal hernia, and antral erosions;  otherwise, unremarkable stomach, patent pylorus, and bulbar erosions;  otherwise, D1 and D2 appeared unremarkable.    COLONOSCOPY FINDINGS:  Normal rectum.  Multiple colonic polyps were  resected as described above.  Poor prep.  Stool sample obtained.  Terminal ileum not intubated.   The patient's symptoms of nausea, vomiting, and abdominal pain are out  of proportion to objective findings.  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   RECOMMENDATIONS:  We will go ahead and check H. pylori serology today  and treat if positive.  We will set him up for an endoscopic ultrasound  of his pancreas to see if we can document changes or chronic  pancreatitis.  We will make further recommendations in the very near  future.      Jonathon Bellows, M.D.  Electronically Signed     RMR/MEDQ  D:  03/09/2008  T:  03/10/2008  Job:  [id number]   cc:   [name], M.D.  Fax: [phone]

## 2010-11-06 NOTE — Op Note (Signed)
[name], [name]               ACCOUNT NO.:  [id number]   MEDICAL RECORD NO.:  [id number]          PATIENT TYPE:  INP   LOCATION:  A338                          FACILITY:  [facility]   PHYSICIAN:  Dalia Heading, M.D.  DATE OF BIRTH:  [birth year]   DATE OF PROCEDURE:  01/01/2008  DATE OF DISCHARGE:                               OPERATIVE REPORT   PREOPERATIVE DIAGNOSES:  Cholecystitis, cholelithiasis, and cirrhosis.   POSTOPERATIVE DIAGNOSES:  Cholecystitis, cholelithiasis, and cirrhosis.   PROCEDURE:  Laparoscopic cholecystectomy, liver biopsy.   SURGEON:  Dalia Heading, MD.   ANESTHESIA:  General endotracheal.   INDICATIONS:  The patient is a 50 year old white male who is referred  for evaluation and treatment of biliary colic secondary to  cholelithiasis.  During the surgery, it is felt the patient would  require a biopsy due to his history of cirrhosis.  Risks and benefits of  the procedure including bleeding, infection, hepatobiliary injury, and  the possibly an open procedure were fully explained to the patient, who  gave informed consent.   PROCEDURE NOTE:  The patient was placed in supine position.  After  induction of general endotracheal anesthesia, the abdomen was prepped  and draped using in the usual sterile technique with Betadine.  Surgical  site confirmation was performed.   A supraumbilical incision was made down to the fascia.  A Veress needle  was introduced into the abdominal cavity and confirmation of placement  was done using the saline drop test.  The abdomen was then insufflated  to 16 mmHg pressure, and a 11-mm trocar was introduced into the  abdominal cavity under direct visualization without difficulty.  The  patient was placed in reverse Trendelenburg position.  An additional 11-  mm trocar was placed in the epigastric region and 5-mm trocars were  placed in the right upper quadrant and right flank regions.  The liver  was noted to have  macronodular cirrhotic changes present.  A Tru-Cut  needle biopsy of the left lobe of liver was taken in close proximity to  the gallbladder fossa.  The specimen was sent to pathology for further  examination.  Any bleeding was controlled using Bovie electrocautery.  The gallbladder was then retracted superiorly and laterally.  The  dissection was begun around the infundibulum of the gallbladder.  The  cystic duct was first identified.  Its juncture to the infundibulum  fully identified.  EndoClips placed proximally and distally on the  cystic duct, and the cystic duct was divided.  This likewise was done  cystic artery.  Gallbladder was then freed away from the gallbladder  fossa using Bovie electrocautery.  The gallbladder was delivered through  the epigastric trocar site using Endocatch bag.  Gallbladder fossa was  inspected, and no abnormal bleeding or bile leakage was noted.  Surgicel  was placed in the gallbladder fossa.  All fluid and air were then  evacuated from the abdominal cavity prior to removal of the trocars.   All wounds were irrigated with normal saline.  All wounds were injected  with 0.5% Sensorcaine.  The supraumbilical fascia  was reapproximated  using an 0 Vicryl interrupted suture.  All skin incisions were closed  using staples.  Betadine ointment and dry sterile dressings were  applied.   All tape and needle counts were correct at the end of the procedure.  The patient was extubated in the operating room and went back to  recovery room, awake and in stable condition.   COMPLICATIONS:  None.   SPECIMEN:  Gallbladder, liver biopsy.   ESTIMATED BLOOD LOSS:  Minimal.      Dalia Heading, M.D.  Electronically Signed     MAJ/MEDQ  D:  01/01/2008  T:  01/02/2008  Job:  [id number]   cc:   [name], M.D.  Fax: [phone]   R. Roetta Sessions, M.D.  [address]  [place] [address]

## 2010-11-06 NOTE — Op Note (Signed)
[name], [name]               ACCOUNT NO.:  [id number]   MEDICAL RECORD NO.:  [id number]          PATIENT TYPE:  INP   LOCATION:  1604                         FACILITY:  [facility]   PHYSICIAN:  Alvy Beal, MD    DATE OF BIRTH:  [birth year]   DATE OF PROCEDURE:  08/17/2007  DATE OF DISCHARGE:  08/18/2007                               OPERATIVE REPORT   PREOPERATIVE DIAGNOSIS:  Right total hip prosthesis dislocation.   POSTOPERATIVE DIAGNOSIS:  Right total hip prosthesis dislocation.   OPERATIVE PROCEDURE:  Closed reduction of right hip.   COMPLICATIONS:  None.   CONDITION:  Stable.   OPERATIVE FINDINGS:  Satisfactory closed reduction confirmed with C-arm  in both AP and lateral views.  No evidence of periprosthetic fracture or  distal femur fracture.   HISTORY:  [name] is a pleasant 50 year old gentleman who is a patient  of Dr. Despina Hick who has had a total hip replacement and has had multiple  previous dislocations.  He presents after a fall with another right hip  dislocation.  He was seen in the emergency room, admitted for a closed  reduction of the hip.   All appropriate risks, benefits and alternatives were discussed with the  patient and his mother and consent was obtained.   OPERATIVE NOTE:  The patient was brought to the operating room.  The  correct extremity was identified and the time-out was done.  A mask  general anesthetic was provided and a gentle closed reduction maneuver  was performed.  The leg lengths were then restored as was the proper  rotation of the lower extremity.  At this point, knee immobilizer was  placed as was the hip abduction pillow and axial C-arm views in both the  AP and lateral views confirmed satisfactory position of the prosthesis  and no evidence of any distal femoral or periprosthetic fracture. As a  result, the patient was then transferred back to the post anesthesia  care unit for appropriate recovery.  He will be admitted,  discharged  tomorrow after PT evaluation with appropriate follow-up with Dr. Despina Hick.      Alvy Beal, MD  Electronically Signed     DDB/MEDQ  D:  08/17/2007  T:  08/18/2007  Job:  [id number]

## 2010-11-06 NOTE — H&P (Signed)
NAMEFERNANDEZ, Brendan               ACCOUNT NO.:  [id number]   MEDICAL RECORD NO.:  [id number]          PATIENT TYPE:  INP   LOCATION:  1604                         FACILITY:  [facility]   PHYSICIAN:  Alvy Beal, MD    DATE OF BIRTH:  May 28, 1961   DATE OF ADMISSION:  08/17/2007  DATE OF DISCHARGE:  08/18/2007                              HISTORY & PHYSICAL   ADMISSION DIAGNOSIS:  Right hip dislocation (prosthesis).   HISTORY:  This is a 50 year old gentleman who is well known to  [facility] because he is an active patient of Dr.  Lequita Halt.  He has had previous hip dislocations and now presents after a  fall with a recurrent right hip dislocation.  The patient indicates as  does his family members who are here to assist with providing the  history, that he was arising from a seated position when he lost his  balance, slipped, fell and noted immediate right hip pain.  This  occurred approximately 2 a.m. on Monday morning and because of the  ongoing pain, he was ultimately brought to the emergency room this  afternoon.  After arrival here at about 1:30, x-rays were taken and a  consultation was requested secondary to the dislocation.   It should be noted the patient does have some underlying issues.  This  is a patient who has had multiple dislocations all due to some sort of  postural indiscretion.  He denies any significant numbness, tingling or  weakness in the lower extremity.   PAST MEDICAL HISTORY:  1. 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  2. Anxiety.  3. Bipolar disorder.  4. Cirrhosis of the liver.  5. Depression.  6. Status post head injury with chronic balance issues.  7. Sleep apnea.  8. He has had an appendectomy, hernia repair, hip placement and closed      reductions in the past.   SOCIAL HISTORY:  He is a nondrinker and nondrug abuser.  He smokes and  lives with his mother.   ALLERGIES:  1. PENICILLIN.  2. VANCOMYCIN.   MEDICATIONS:  1. Abilify oral 20 mg once a day.  2. Cymbalta 60 mg b.i.d.  3. Fentanyl TD 100 mcg.  4. Lithium carbonate 300 mg t.i.d.  5. Oxycodone 20 mg.  6. He is also on Xanax 2 mg t.i.d. p.r.n.   REVIEW OF SYSTEMS:  He has underlying psychiatric issues and cognitive  impairments.  He has COPD, chronic smoker, difficulty with breathing.  Chronic neurological and cognitive impairments due to the head injury  with problems with his balance.   PHYSICAL EXAMINATION:  GENERAL:  He is a pleasant gentleman.  He is  alert and oriented, complaining only of right hip pain.  EXTREMITIES:  He has a well-healed surgical scar over the right hip.  The right lower extremity is shortened and slightly externally rotated  compared to the contralateral side.  There is no distal femoral  tenderness or gross deformity.  No knee pain or swelling.  No calf  tenderness.  He has full range of motion at  the knee and ankle.  He has  intact peripheral pulses.  NEUROLOGIC:  Grossly intact.  LUNGS:  He has no shortness of breath or chest pain.  ABDOMEN:  Soft, nontender.   DIAGNOSTICS:  1. X-rays demonstrate a superior posterior dislocation of the      prosthesis.  There is no apparent periprosthetic fracture.  2. He has x-rays of his lumbar and thoracic spine and those are      unremarkable for any significant injury.   PLAN:  At this point in time, we will get completion femur films to  insure there is no distal femoral fracture, although clinically there is  a low threshold to consider this.  We will plan on doing closed  reduction in the OR where he can be sedated appropriately.  We will get  appropriate preoperative lab work as well as x-rays.      Alvy Beal, MD  Electronically Signed     DDB/MEDQ  D:  08/17/2007  T:  08/18/2007  Job:  [id number]   cc:   Alvy Beal, MD  Fax: [phone]

## 2010-11-06 NOTE — Consult Note (Signed)
NAME:  [name], [name]               ACCOUNT NO.:  [id number]   MEDICAL RECORD NO.:  [id number]          PATIENT TYPE:  AMB   LOCATION:  DAY                           FACILITY:  [facility]   PHYSICIAN:  R. Roetta Sessions, M.D. DATE OF BIRTH:  October 12, 1960   DATE OF CONSULTATION:  02/11/2007  DATE OF DISCHARGE:                                 CONSULTATION   [facility] GASTROENTEROLOGY CONSULTATION   REQUESTING PHYSICIAN:  James L. Juanetta Gosling, MD.   REASON FOR CONSULTATION:  Dysphagia/patient requesting further  evaluation of liver disease.   HISTORY OF PRESENT ILLNESS:  [name] is a 50 year old, Caucasian male.  He is referred through the courtesy of Dr. Juanetta Gosling for dysphagia.  He  tells me over the last year he has noticed worsening of dysphagia.  He  tells me he feels like his food gets stuck retrosternally.  He tells me  it feels like it gets clogged up.  He is having problems with pills,  solids, and occasionally liquids as well.  Occasionally solids and  liquids are regurgitated.  He does seem to be taking Tums on a regular  basis for heartburn and indigestion.  He complains of anorexia and early  satiety.  His weight had remained stable.   He tells me he has a history of alcoholic cirrhosis.  He had heavy  alcohol consumption until about five years ago.  He and his mother both  have multiple questions regarding his liver disease at this time.  Last  records from 2001 where he was seen by Dr. Dionicia Abler gives history of  alcoholic hepatitis and chronic GERD.  He has never had a liver biopsy.  His markers for BNC have been negative previously.  He had history of  dysphagia back in April of 1997 and had an EGD, which showed reflux  esophagitis, gastritis, and duodenitis, esophagus was dilated with a 54-  [place] Maloney dilator, CLO test was negative.  [name] and his mother  are wanting to know the status of his liver disease and further  evaluation.  He has never been to a tertiary care center  for his liver  disease.  As I said, he quit drinking alcohol five years ago.  He does  continue to consume marijuana on a very rare basis.   PAST MEDICAL/SURGICAL HISTORY:  1. Avascular necrosis of the right hip.  He is scheduled to undergo      surgery very soon.  2. History of alcoholic pancreatitis.  3. History of alcoholic hepatitis.  4. Bipolar disorder.  5. Umbilical herniorrhaphy.  6. Reflux esophagitis and dysphagia with EGD, mild changes of reflux      esophagitis, gastritis, and duodenitis dilated with a 54-[place]      Maloney dilator, CLO test negative.  7. Left leg surgery.  8. COPD.   CURRENT MEDICATIONS:  1. Duragesic 300 mcg q.72h.  2. Alprazolam 2 mg p.r.n.  3. Abilify 10 mg daily.  4. Cymbalta 120 mg daily.  5. Lithium 300 mg b.i.d.  6. ProAir two puffs q.i.d. p.r.n.  7. Oxycodone 20 mg every hour as needed.  8. Tums p.r.n.   ALLERGIES:  PENICILLIN.   FAMILY HISTORY:  Positive for father deceased with alcoholic liver  cirrhosis.  His mother is 1 and in good health.  He has two sisters,  one with uterine cancer.   SOCIAL HISTORY:  [name] is divorced.  He lives with his mother, who  cares for him.  He has an approximately 30-pack-year history of tobacco  use, heavy alcohol use, quit drinking five years ago, occasional  marijuana use.   REVIEW OF SYSTEMS:  See HPI, otherwise negative.   PHYSICAL EXAMINATION:  VITAL SIGNS:  Weight 173 pounds, height 67  inches, temp 97.7, blood pressure 120/82, and pulse 72.  GENERAL:  [name] is a well-developed Caucasian male, who ambulates  with a cane.  He is in no acute distress.  He is accompanied by his  mother today.  He appears older than his stated age.  HEENT:  Sclerae clear and nonicteric, conjunctivae pink.  Oropharynx  pink and moist without any lesions.  NECK:  Supple without any masses or thyromegaly.  CHEST:  Regular rate and rhythm, normal S1 S2, without any murmurs,  clicks, rubs, or gallops.   LUNGS:  Decreased breath sounds bilaterally, no acute distress.  ABDOMEN:  Positive bowel sounds x4, no __________, soft, nontender, and  nondistended without palpable masses or organomegaly, no rebound or  guarding.  EXTREMITIES:  With clubbing, no edema bilaterally.  SKIN:  Pink, warm, and dry without any rashes or jaundice.   IMPRESSION:  [name] is a 50 year old Caucasian male with a one year  history of worsening dysphagia.  He has history of erosive esophagitis.  I suspect he may have developed webbing or stricture and is going to  need further evaluation.  Given his history of alcohol abuse, he is at  risk for gastric carcinoma as well.   He tells me he has a history of alcoholic cirrhosis.  He has a multitude  of questions today and is wanting further evaluation regarding this.  He  has never had liver biopsies.  Unsure as to previous workup, although I  do know we have seen him years ago, when he was still consuming alcohol,  for alcoholic hepatitis.  His mother and he are requesting further  evaluation today.  He has never been to a tertiary care center for  further evaluation.  I suspect he has well-compensated alcoholic  cirrhosis.   PLAN:  1. EGD with possible ED with Dr. Jena Gauss in the near future.  Discussed      the procedure including the risks and benefits to include but not      limited to bleeding, infection, perforation, or drug reaction.  He      agrees and consent will be obtained.  2. Abdominal ultrasound.  3. LFTs, PT, INR, and AFP.  4. Will request recent CBC from Dr. Juanetta Gosling' office.  5. He will need a screening colonoscopy at age 41.   We would like to thank Dr. Juanetta Gosling for allowing [place] to participate in the  care of [name].      Lorenza Burton, N.P.      Jonathon Bellows, M.D.  Electronically Signed    KJ/MEDQ  D:  02/11/2007  T:  02/11/2007  Job:  [id number]   cc:   [name], M.D.  Fax: [phone]

## 2010-11-06 NOTE — Discharge Summary (Signed)
NAMEMARRELL, [name]               ACCOUNT NO.:  [id number]   MEDICAL RECORD NO.:  [id number]          PATIENT TYPE:  INP   LOCATION:  A338                          FACILITY:  [facility]   PHYSICIAN:  Dalia Heading, M.D.  DATE OF BIRTH:  1960-07-24   DATE OF ADMISSION:  12/30/2007  DATE OF DISCHARGE:  07/12/2009LH                               DISCHARGE SUMMARY   HOSPITAL COURSE SUMMARY:  The patient is a 50 year old white male with  multiple medical problems, who was found on preoperative blood work to  have leukocytosis.  He has multiple medical problems including cirrhosis  and COPD.  Dr. Shaune Pollack, his primary care physician, admitted the  patient for further evaluation and treatment.  It was felt that his  leukocytosis was probably secondary to some degree to his cholelithiasis  as well as his peripheral cellulitis of the extremities.  His potassium  was supplemented as he was hypokalemic.  He subsequently was taken to  the operating room on January 01, 2008, and underwent laparoscopic  cholecystectomy.  He tolerated the procedure well.  His postoperative  course was remarkable for a bump and his leukocytosis, though his white  blood cell count on the day of discharge is back down to 18,000, which  is lower than his preoperative level of 21,000.  His diet was advanced  without difficulty.  He is being discharged home on postoperative day #2  in good improving condition.   DISCHARGE INSTRUCTIONS:  The patient is to follow up Dr. Franky Macho on  January 12, 2008, Dr. Shaune Pollack as needed.   DISCHARGE MEDICATIONS:  Include;  1. Three Duragesic 100 mcg patches every other day.  2. Oxycodone 15 mg tablets p.o. q.4 h. p.r.n.  3. Alprazolam 0.5 mg p.o. q.6 h. p.r.n.  4. Lithium ER 300 mg p.o. t.i.d.  5. Cymbalta 60 mg two tablets at bedtime.  6. Avelox 400 mg p.o. daily x10 days.  7. Vibramycin 100 mg p.o. b.i.d. x10 days.   PRINCIPAL DIAGNOSES:  1. Cholecystitis, cholelithiasis.  2.  Cellulitis of extremities.  3. Bipolar disorder.  4. History of cirrhosis.   PRINCIPAL PROCEDURE:  Laparoscopic cholecystectomy on January 01, 2008.      Dalia Heading, M.D.  Electronically Signed     MAJ/MEDQ  D:  01/03/2008  T:  01/03/2008  Job:  [id number]   cc:   [name], M.D.  Fax: [phone]

## 2010-11-06 NOTE — Consult Note (Signed)
[name], [name]               ACCOUNT NO.:  [id number]   MEDICAL RECORD NO.:  [id number]          PATIENT TYPE:  INP   LOCATION:  A326                          FACILITY:  [facility]   PHYSICIAN:  Kassie Mends, M.D.      DATE OF BIRTH:  10-Jul-1960   DATE OF CONSULTATION:  01/19/2008  DATE OF DISCHARGE:                                 CONSULTATION   REASON FOR CONSULTATION:  Hepatitis.   HISTORY OF PRESENT ILLNESS:  The patient is a 50 year old gentleman with  a history of cirrhosis with prior history of heavy alcohol use, quit 6  years ago.  His mother contacted the office yesterday concerned that her  son was dying.  We had not seen him since December of 2008.  Apparently,  he had a cholecystectomy back on January 01, 2008, and since that time she  was concerned that all he does is sleep and does not seem as alert and  he normally is.  She complained of him not eating, weight loss.  She  states he was having abdominal pain and she thought it was jaundiced.  I  advised her to take him to the emergency department for evaluation.  He  was evaluated in the emergency department.  Labs revealed a total  bilirubin of 0.6, his alkaline phosphatase was slightly above his  baseline at 206.  His AST, ALT were normal, his albumin was 2.8.  His  white count was slightly elevated 15,900.  His hemoglobin was 11.1.  Potassium was low at 2.3.  This morning it is up to 3.1.   Patient has a history of chronic abdominal pain predominantly in the  right upper quadrant region.  He had cholelithiasis on abdomen  ultrasound in June, 2009.  No evidence of bile duct dilation.  He  underwent cholecystectomy with liver biopsy on January 01, 2008, by Dr.  Franky Macho.  Ever since his surgery he has been more sedated.  According to the mother, who takes care of him, he is lying around,  sleeping all the time and does not even try to eat.  He complains of no  appetite.  There has been about a 20-pound weight loss, per  her report.  He has had some spontaneous regurgitation of yellow fluid periodically.  He has 1-3 soft stools daily.  No blood in the stool or melena.  Patient  is apparently pretty much total care.  She bathes him and cleans him  after bowel movements, etc.  He is able to ambulate with a walker or  cane but has a very unsteady gait.  It is notable that this gentleman  has underwent 8 surgical procedures since October, 2008.  Initially had  his hip replaced due to avascular necrosis.  He then had a followup  surgery for hematoma.  He has had multiple reductions of dislocations  done under anesthesia and then a recent cholecystectomy as outlined  above.   MEDICATIONS AT HOME:  1. Cymbalta 120 mg nightly.  2. Fentanyl patch 100 mcg, according to mother he has 3 patches which  are changed every other day.  3. Lithium 300 mg t.i.d.  4. Oxycodone 15 mg as needed but does not take daily.  5. Xanax 0.5 mg t.i.d.   ALLERGIES:  1. VANCOMYCIN CAUSES WELTS.  2. PENICILLIN CAUSES NAUSEA AND VOMITING.   PAST MEDICAL HISTORY:  1. Apparently diagnosed with cirrhosis in 2003, was actually put on      Hospice at that time.  Mother states that they did not see a      gastroenterologist or hepatologist and was just followed by Dr.      Juanetta Gosling at the time.  He does have a history of heavy alcohol use      but quit 6 years ago.  He apparently saw Dr. Karilyn Cota in the remote      past, back in the late 90s, for alcoholic hepatitis and alcoholic      pancreatitis.  2. Most recently saw Dr. Jena Gauss August 2008 for EGD for dysphagia and      he had a Schatzki's ring dilated.  No evidence of varices at the      time.  He had a CLO test which was negative, done for antral      erosions.  He has had followup AFP which was normal.  3. He has a history of COPD, bipolar disorder, sleep apnea, history of      cellulitis bilateral lower extremities, recently stopped      antibiotics and diuretics.  4. History  of alcoholic pancreatitis.  Liver biopsy January 01, 2008,      revealed chronic active hepatitis with periportal fibrosis, bile      duct proliferation graded at grade 2, stage III-IV.  5. He is status post hip replacement for avascular necrosis October      2008 and has had revision as well as multiple dislocations      requiring reductions.  6. Recent cholecystectomy, as above.  7. Status post umbilical hernia repair.  8. Appendectomy.  9. Mother reports possible head trauma due to a 40 foot fall years      ago, states that mentally he is not right.   FAMILY HISTORY:  Mother is in good health.  Father died with alcoholic  cirrhosis.  One sister had uterine cancer.   SOCIAL HISTORY:  He is divorced.  He lives with his mother and  grandmother.  He has a history of drug use and alcohol use in the past.  He currently smokes 1-1/2 pack of cigarettes daily.   REVIEW OF SYSTEMS:  See HPI for GI.  In addition, no melena or bright  red blood per rectum.  CARDIOPULMONARY:  He denies chest pain, shortness  of breath, palpitations or cough.   PHYSICAL EXAM:  Temp 96.8, pulse 70, respirations 20, blood pressure  98/67, O2 sats 96% on room air, weight is 73.5 kg, height 67 inches.  GENERAL:  A pleasant, chronically ill-appearing, Caucasian gentleman in  no acute distress.  He is resting comfortably but easily arouses and  converses.  SKIN:  Warm and dry, no jaundice.  HEENT:  Sclera nonicteric, oropharyngeal mucosa moist and pink.  No  lymphadenopathy.  CHEST:  Lungs clear to auscultation.  CARDIAC EXAM:  Regular rate and rhythm.  Normal S1, S2.  No murmurs,  rubs or gallops.  ABDOMEN:  Positive bowel sounds.  Abdomen soft, nondistended, nontender,  no organomegaly or masses, no rebound or guarding nor abdominal bruits  or hernias.  LOWER EXTREMITIES:  Chronic venostasis, trace  pedal edema bilaterally.   LABS:  As above.  In addition, sodium 142, potassium 5, BUN 8,  creatinine 93, ammonia  22, lipase 29.  Total bilirubin 0.6, alkaline  phosphatase 206, AST 26, ALT 16, albumin 2.8.   IMPRESSION:  The patient is a 50 year old gentleman with prior heavy  alcohol use/drug use, status post recent cholecystectomy, who presents  with change in mental status, hypokalemia.  Apparently he continues to  not eat well, sleeps all the time except at night.  Liver biopsy  revealed active hepatitis, grade 2, stage III-IV.  I am concerned that  the patient is on so many medications, including significant Duragesic,  antipsychotics and Xanax which may be contributing to his sedation in  the setting of cirrhosis.  He has also had multiple surgeries with  anesthesia and recent increased sedation may be related to his recent  cholecystectomy with general anesthesia.  Patient also reports history  of sleep apnea, not using continuous positive airway pressure machine.  At this point, I need to review our office charts to determine the  extent of his prior workup for his cirrhosis.  He was checked for viral  hepatitis in the remote past but family is very concerned and both  family and the patient desire to have this rechecked given his history.   RECOMMENDATIONS:  1. Will add PPI.  2. Recheck Hepatitis B Surface Antigen, Hepatitis C Antibody.  3. Review office chart for prior workup of cirrhosis.  4. Further recommendations to follow.      Tana Coast, P.A.      Kassie Mends, M.D.  Electronically Signed    LL/MEDQ  D:  01/19/2008  T:  01/19/2008  Job:  [id number]   cc:   [name], M.D.  Fax: [phone]

## 2010-11-06 NOTE — Group Therapy Note (Signed)
NAMEKENNON, ENCINAS               ACCOUNT NO.:  [id number]   MEDICAL RECORD NO.:  [id number]          PATIENT TYPE:  INP   LOCATION:  A338                          FACILITY:  [facility]   PHYSICIAN:  Edward L. Juanetta Gosling, M.D.DATE OF BIRTH:  Dec 04, 1960   DATE OF PROCEDURE:  DATE OF DISCHARGE:                                 PROGRESS NOTE   Ms. Smeltzer is admitted because of an elevated white blood cell count.  He  also has a low potassium.  His IV access is poor.  He is confused this  morning.  His mother says that he has been vomiting up some bile.  He,  his mother, and his grandmother are concerned about his urine output,  but I think it is mostly because of the vomiting.   PHYSICAL EXAMINATION:  GENERAL:  This morning shows he is a little  confused.  VITAL SIGNS:  Temperature is 97.1, pulse 86, respirations 20,  blood pressure 111/69, and O2 sats 96% on room air.  PSYCHE:  I have  think his mental status is about at baseline.   LABORATORY DATA:  His white blood count, which as an outpatient was a  21,000, is now 19,000.  His potassium is 2.4 this morning, but his BUN  15, creatinine 1.12, alkaline phosphatase is 169, and albumin is 3.   ASSESSMENT:  He has got probable gallbladder disease.  He does have  cellulitis of both legs, which is much better.  He has significant  sunburn on both legs and on his right arm, which I think is probably  from sun sensitivity due to Avelox.  His potassium remains low.  His  white count is still up and he is still having some vomiting, so he is  on the schedule I think for tomorrow for surgery and I think we should  plan to go ahead and do that once we get his potassium replaced, etc.  I  have asked for PICC line to see if we can have better IV access.      Edward L. Juanetta Gosling, M.D.  Electronically Signed     ELH/MEDQ  D:  12/31/2007  T:  12/31/2007  Job:  [id number]

## 2010-11-06 NOTE — H&P (Signed)
NAMEKONSTANTIN, LEHNEN               ACCOUNT NO.:  [id number]   MEDICAL RECORD NO.:  [id number]          PATIENT TYPE:  INP   LOCATION:  NA                           FACILITY:  [facility]   PHYSICIAN:  Ollen Gross, M.D.    DATE OF BIRTH:  [birth year]   DATE OF ADMISSION:  DATE OF DISCHARGE:                              HISTORY & PHYSICAL   DATE OF OFFICE VISIT HISTORY AND PHYSICAL:  March 17, 2007   DATE OF ADMISSION:  March 30, 2007   CHIEF COMPLAINT:  Right hip pain.   HISTORY OF PRESENT ILLNESS:  A 50 year old male who has been seen by Dr.  Lequita Halt for ongoing right hip pain, has been progressive in nature.  He  has had an MRI in the past showing avascular necrosis of the right hip,  has been debilitating, and he has reached a point where he could benefit  from undergoing surgical intervention.  Risks and benefits have been  discussed and he elects to proceed with surgery.   ALLERGIES:  PENICILLIN.   CURRENT MEDICATIONS:  Duragesic patch, Xanax, Cymbalta, lithium, ProAir  inhaler, oxycodone, and Abilify.   PAST MEDICAL HISTORY:  1. Bipolar disorder/manic depression.  2. History of alcohol use/abuse, stopped 5 years ago.  3. COPD.  4. Alcoholic liver cirrhosis.  5. History of pancreatitis.  6. Nocturnal oxygen dependency.   SURGICAL HISTORY:  1. Hernia.  2. Pin placement in leg.  3. Appendectomy.   SOCIAL HISTORY:  Divorced.  One-and-a-half pack-per-day smoker.  No  alcohol.  Two children.  Mother will be assisting with care.   FAMILY HISTORY:  Grandmother with heart disease.  Mother with arthritis.   REVIEW OF SYSTEMS:  GENERAL:  No fevers, chills, night sweats.  NEURO:  Bipolar/manic-depressive disorder with history of alcohol use/abuse.  No  seizures or syncope.  RESPIRATORY:  Shortness breath on exertion, no  shortness breath at rest.  No productive cough or hemoptysis.  CARDIOVASCULAR:  No chest pain or angina.  He does have a little bit of  orthopnea which is  believed to be due to his COPD.  GI:  No nausea,  vomiting, diarrhea or constipation.  He does have liver cirrhosis.  GU:  No dysuria, hematuria or discharge.  MUSCULOSKELETAL:  Right hip.   PHYSICAL EXAMINATION:  VITAL SIGNS:  Pulse 80, respirations 16, blood  pressure 108/68.  GENERAL:  A 50 year old white male, short stature, flat affect.  No  acute distress.  He is accompanied by his mother.  Alert, oriented and  cooperative.  HEENT:  Normocephalic, atraumatic.  Pupils equal, round and reactive.  Oropharynx clear.  EOMs intact.  NECK:  Supple.  No bruits.  CHEST:  Does have an end-inspiratory wheeze at the right base, distant  breath sounds, no rhonchi.  HEART:  Regular rate and rhythm.  No murmur, S1, S2 noted.  ABDOMEN:  Soft, slightly round.  Bowel sounds present.  RECTAL, BREAST, GENITALIA:  Not done, not pertinent to present illness.  EXTREMITIES:  Right hip shows flexion 90, 30 degrees abduction, 30  degrees external rotation, minimal internal rotation.  IMPRESSION:  1. 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  2. Bipolar disorder/manic depression.  3. History of chronic alcohol use/abuse, stopped 5 years ago.  4. Chronic obstructive pulmonary disease.  5. Alcoholic liver cirrhosis.  6. Past history of pancreatitis.  7. History of cellulitis right leg.   PLAN:  The patient admitted to [facility] to undergo a right  total hip replacement arthroplasty.  Surgery will be performed by Dr.  Ollen Gross.      Alexzandrew L. Perkins, P.A.C.      Ollen Gross, M.D.  Electronically Signed    ALP/MEDQ  D:  03/29/2007  T:  03/30/2007  Job:  [id number]   cc:   [name], M.D.  Fax: [phone]

## 2010-11-06 NOTE — Op Note (Signed)
NAMECORDAI, RODRIGUE               ACCOUNT NO.:  [id number]   MEDICAL RECORD NO.:  [id number]          PATIENT TYPE:  INP   LOCATION:  1528                         FACILITY:  [facility]   PHYSICIAN:  Ollen Gross, M.D.    DATE OF BIRTH:  08/16/1960   DATE OF PROCEDURE:  08/19/2007  DATE OF DISCHARGE:                               OPERATIVE REPORT   PREOPERATIVE DIAGNOSIS:  Dislocated right total hip arthroplasty.   POSTOPERATIVE DIAGNOSIS:  Dislocated right total hip arthroplasty.   PROCEDURE:  Right total hip arthroplasty closed reduction.   SURGEON:  Ollen Gross, M.D., no assistant.   ANESTHESIA:  General.   COMPLICATIONS:  None.   CONDITION:  Stable to recovery.   BRIEF CLINICAL NOTE:  [name] is a 50 year old male who had a right total  hip arthroplasty done last fall.  He has had four dislocations.  Unfortunately there were difficulties with compliance due to a closed  head injury.  He dislocated again this morning.  He did not recall the  events associated with it.  He presents now for closed reduction.   PROCEDURE IN DETAIL:  After the successful administration of general  anesthetic, I applied traction in the 90-90 position of the right hip at  90 degrees of flexion and counter traction is applied on the right iliac  crest.  Traction is applied and the hip was audibly reduced.  It is then  able to flex into 90, rotate about 20 in each direction without any  dislocation.  With his leg extended, his leg lengths were now equal.  X-  rays taken, AP pelvis showing concentric reduction of the hip.  He is  then placed into a knee immobilizer, awakened and transferred to  recovery in stable condition.      Ollen Gross, M.D.  Electronically Signed     FA/MEDQ  D:  08/19/2007  T:  08/20/2007  Job:  [id number]

## 2010-11-06 NOTE — Group Therapy Note (Signed)
[name], [name]               ACCOUNT NO.:  [id number]   MEDICAL RECORD NO.:  [id number]          PATIENT TYPE:  INP   LOCATION:  A326                          FACILITY:  [facility]   PHYSICIAN:  [name], M.D.DATE OF BIRTH:  Sep 30, 1960   DATE OF PROCEDURE:  DATE OF DISCHARGE:                                 PROGRESS NOTE   [name] was admitted with change in mental status, which may be  related to hypokalemia.  He also has an active hepatitis that was seen  on liver biopsy, but it is not clear to me what this is from.  I have  asked for GI consultation to help with this.  This morning as far as his  exam is concerned, he looks much better.  He is awake and alert.  According his family, he is much improved.  He has had problems with  cellulitis of his leg and some fairly marked edema of his leg, but I had  stopped his medications for that last week.   His physical examination shows that he is awake and alert, looks back to  his baseline mental status.  His chest is clear.  Temperature is 96.8,  pulse 70, respirations 20, blood pressure 98/67, and O2 sat is 96% on  room air and his chest is relatively clear.  His heart is regular.  His  legs look much better.   Assessment is that he is overall much improved.   Plan for GI consultation.  He is still getting potassium replacement now  and we will see how he does after the potassium is replaced.      [name], M.D.  Electronically Signed     ELH/MEDQ  D:  01/19/2008  T:  01/19/2008  Job:  [id number]

## 2010-11-06 NOTE — H&P (Signed)
[name], [name]               ACCOUNT NO.:  [id number]   MEDICAL RECORD NO.:  [id number]          PATIENT TYPE:  INP   LOCATION:  1528                         FACILITY:  [facility]   PHYSICIAN:  [name], M.D.    DATE OF BIRTH:  11-26-1960   DATE OF ADMISSION:  08/19/2007  DATE OF DISCHARGE:                              HISTORY & PHYSICAL   CHIEF COMPLAINT:  Right hip dislocation.   HISTORY OF PRESENT ILLNESS:  This is a 50 year old male well known to  Dr. Ollen Smith having previously undergone a total knee replacement  in the past for AVN, and unfortunately, he has undergone several  dislocations and had a recent admission two days ago for a third  dislocation of the right hip.  He was reduced by one of Dr. Deri Smith  partners and discharged home on the 24th, yesterday.  Unfortunately, he  started having severe pain while in his abduction brace in bed in the  middle of the night.  He was brought into the hospital on the date of  admission of February 25.  X-rays taken and found to have a fourth  dislocation of the total hip felt to be grossly unstable at this point.  He was scheduled to follow up the next week to discuss possibly putting  in a constrained liner.  Due to his redislocation within 48 hours, he is  brought into the hospital.  He will undergo closed reduction and placed  at bedrest.  We will order the special constrained liner, and as soon as  it arrives, more than likely convert him over to a constrained during  the hospital course.  The patient was subsequently admitted first for  closed reduction of the dislocated hip.   ALLERGIES:  PENICILLIN AND VANCOMYCIN.   CURRENT MEDICATIONS:  1. Abilify 20 mg p.o. daily.  2. Cymbalta 60 mg at lunch and q h.s.  3. Lithium 300 mg at breakfast, lunch, and at 4:30 p.m.  4. Xanax  2 mg q.i.d. p.o. p.r.n.  5. Oxycodone Elixir 20 mg per meal every 2 hours p.r.n. pain.  6. Fentanyl patch 100 mcg 3 patches changes every  other day.   PAST MEDICAL HISTORY:  1. Bipolar disorder.  2. History of alcohol use/abuse.  3. Chronic obstructive pulmonary disease.  4. Alcohol liver cirrhosis.  5. History of pancreatitis.  6. Nocturnal oxygen dependency.  7. Depression.  8. Previous closed head injury.   PAST SURGICAL HISTORY:  1. Right total hip arthroplasty.  He has undergone three closed      reductions of the total hip, most recently being two days ago on      August 17, 2007.  2. Hernia repair and pin placement in his leg.  3. Appendectomy.   SOCIAL HISTORY:  The patient is divorced.  Smoker, about a pack to one  half pack per day.  He is cared for by his mother and grandmother.   FAMILY HISTORY:  Noncontributory   REVIEW OF SYSTEMS:  GENERAL:  No fevers, chills, night sweats.  NEUROLOGIC:  No seizures, syncope, paralysis.  He does  have a closed  head injury in the past.  RESPIRATORY:  No shortness of breath,  productive cough or hemoptysis.  Does have COPD and nocturnal oxygen  dependency.  CARDIOVASCULAR:  No chest pain, angina, orthopnea.  GI:  No  nausea, vomiting, diarrhea or constipation.  GU:  No dysuria, hematuria  or discharge.  MUSCULOSKELETAL:  Right hip.   PHYSICAL EXAMINATION:  VITAL SIGNS:  Temperature 97.2, pulse 88,  respiratory rate 16, blood pressure 115/71.  GENERAL:  50 year old white male, well-nourished, well-developed, in  mild distress secondary to dislocated hip seen lying supine on the  hospital stretcher in the emergency department.  He is accompanied by  his mother and his grandmother.  He is alert.  He is oriented.  HEENT:  Normocephalic, atraumatic, pupils round and reactive.  EOMs  intact.  NECK:  Supple.  CHEST:  Clear anterior and posterior lung fields.  HEART:  Regular rate and rhythm, no murmur.  ABDOMEN:  Distended, slightly tympanic, bowel sounds are present,  nontender.  RECTAL/BREASTS/GENITALIA:  Not done, not pertinent to present illness.  EXTREMITIES:   Right lower extremity is shortened and internally rotated  as compared to the left lower extremity likely due to the dislocation.  Motor function is intact.   X-RAYS:  X-rays show recurrent dislocation of the right total hip.  Chest x-ray taken two days ago:  Mild bibasilar atelectasis.   IMPRESSION:  1. 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  2. Bipolar disorder.  3. History of alcohol use and abuse.  4. Chronic obstructive pulmonary disease.  5. Alcohol liver cirrhosis.  6. History of pancreatitis.  7. Nocturnal oxygen dependency.  8. History of depression.  9. History of closed head injury.   PLAN:  The patient was admitted to [facility] to undergo a  closed reduction of the dislocated right total hip.  Surgery will be  performed by Dr. Ollen Smith.      [name], P.A.C.      [name], M.D.  Electronically Signed    ALP/MEDQ  D:  08/20/2007  T:  08/20/2007  Job:  [id number]   cc:   [name], M.D.  Fax: [phone]

## 2010-11-06 NOTE — Op Note (Signed)
[name], [name]               ACCOUNT NO.:  [id number]   MEDICAL RECORD NO.:  [id number]          PATIENT TYPE:  INP   LOCATION:  1615                         FACILITY:  [facility]   PHYSICIAN:  Ollen Gross, M.D.    DATE OF BIRTH:  [birth year]   DATE OF PROCEDURE:  03/30/2007  DATE OF DISCHARGE:                               OPERATIVE REPORT   PREOPERATIVE DIAGNOSIS:  Osteonecrosis, right hip.   POSTOPERATIVE DIAGNOSIS:  Osteonecrosis, right hip.   PROCEDURE:  Right total hip arthroplasty.   SURGEON:  Ollen Gross, M.D.   ASSISTANT:  Alexzandrew L. Perkins, P.A.-C.   ANESTHESIA:  General.   ESTIMATED BLOOD LOSS:  300.   DRAINS:  None.   COMPLICATIONS:  None.   CONDITION:  Stable to recovery.   BRIEF CLINICAL NOTE:  [name] is a 50 year old male who has severe  intractable pain in his right hip.  Plain radiographs show avascular  necrosis with significant subchondral sclerosis and early collapse.  This is confirmed by MRI scan.  He presents now for right total hip  arthroplasty secondary to intractable pain.   PROCEDURE IN DETAIL:  After the successful administration of general  anesthetic, the patient is placed in the left lateral decubitus position  with the right side up and held with the hip positioner.  The right  lower extremity is isolated from his perineum with plastic drapes and  prepped and draped in the usual sterile fashion.  A short posterolateral  incision is made with a 10 blade through subcutaneous tissue to the  level of the fascia lata, which is incised in line with the skin  incision.  The sciatic nerve is palpated and protected and the short  external rotators isolated off the femur.  Capsulectomy is performed and  the hip is dislocated.  The center of the femoral head is marked and a  trial prosthesis is placed such that the center of the trial femoral  head matches the center of his native femoral head.  Osteotomy lines are  marked on the femoral  neck and an osteotomy made with an oscillating  saw.  The femoral head is removed and the femur retracted anteriorly to  gain acetabular exposure.   The acetabular retractors are placed and femur is retracted anteriorly.  The labrum is removed.  Reaming starts at 45 mm, coursing in increments  of two to 53 mm, and a 54-mm pinnacle acetabular shell is impacted in  anatomic position and transfixed with two domed screws.  The permanent  apex hole eliminator and permanent 36-mm neutral Ultramet metal liner  was placed.   The femur is then prepared with the canal finder and irrigation.  Axial  reaming is performed to 15.5, mm proximal reaming to a 20-F and the  sleeve machined to a large.  A 20-F large trial sleeve is placed with a  20 x 15 stem and a 36 +9 about 10-15 degrees beyond native anteversion.  His native version was neutral.  The hip is then reduced with  outstanding stability.  There is full extension, full external rotation  70  degrees flexion, 40 degrees adduction and 90 degrees internal  rotation in 90 degrees of flexion, 70 degrees of internal rotation.  By  placing right leg on top of the left, it felt as though leg lengths were  equal.  The hip is then dislocated and all trials are removed.  The  permanent 20-F large sleeve is placed with a 20 x 15 stem, 36 +9 neck 20  degrees beyond native anteversion.  The 36 +0 head is placed and the hip  is reduced with the same stability parameters.  The wound is copiously  irrigated with saline solution and the short rotators reattached to the  femur through drill holes.  The fascia lata is closed with interrupted  #1 Vicryl, subcu closed with #1 and #2-0 Vicryl and a subcuticular  running 4-0 Monocryl.  The incision is cleaned and dried and Steri-  Strips and a bulky sterile dressing applied.  He is placed into a knee  immobilizer, awakened and transported to recovery in stable condition.      Ollen Gross, M.D.  Electronically  Signed     FA/MEDQ  D:  03/30/2007  T:  03/31/2007  Job:  [id number]

## 2010-11-06 NOTE — Assessment & Plan Note (Signed)
NAME:  [name], [name]                CHART#:  [id number]   DATE:  03/10/2007                       DOB:  01-13-1961   PRIMARY CARE PHYSICIAN:  Dr. Juanetta Gosling   CHIEF COMPLAINT:  Followup EGD for dysphagia.   SUBJECTIVE:  The patient is a 50 year old Caucasian male with history of  esophageal dysphagia.  He underwent EGD by Dr. Jena Gauss on February 16, 2007.  He was found to have a prominent Schatzki ring which was dilated with a  56 [place] Maloney dilator.  He had a small hiatal hernia, tiny antral  erosions and was started on Prilosec 20 mg daily.  The patient presents  today telling me his dysphagia is much improved.  He denies any problems  with heartburn or indigestion.  However, he feels like he does not need  to take the Prilosec.  He is scheduled to have a right hip replacement  for avascular necrosis by. Dr. Lequita Halt in the near future.  He has  history of alcohol abuse.  He has history of well compensated cirrhosis.  He has AFP which was normal.  He had normal LFTs except for an albumin  of 3.3.  He had a normal INR.  He had an ultrasound on February 16, 2007,  which showed cholelithiasis, minimal gallbladder wall prominence, mild  extrahepatic and central intrahepatic biliary dilatation, and the  remainder of the pancreas, liver and spleen appeared normal.  Overall he  is feeling well.  He denies any complaints at this time.   CURRENT MEDICATIONS:  See the list from March 10, 2007.   ALLERGIES:  Penicillin.   OBJECTIVE:  VITAL SIGNS:  Weight 173 pounds, height 67 inches,  temperature 97.9, blood pressure 122/84, pulse 64.  GENERAL:  The patient is a well-nourished Caucasian male in no acute  distress.  He is alert, oriented, pleasant and cooperative.  He is  accompanied by his mother today.  HEENT:  Pupils equal.  Sclerae clear and nonicteric.  Conjunctivae pink.  Oropharynx pink and moist without any lesions.  CHEST:  Heart regular rate and rhythm.  Normal S1, S2.  ABDOMEN:   Protuberant with positive bowel sounds x4.  No bruits  auscultated.  Soft, nontender, nondistended without palpable masses or  hepatosplenomegaly.  No rebound, tenderness or guarding.  Exam is  limited given the patient's body habitus.  EXTREMITIES:  Without clubbing or edema bilaterally.   ASSESSMENT:  The patient is a 50 year old Caucasian male with esophageal  dysphagia secondary to Schatzki ring, resolved status post dilatation.  He is instructed to resume PPI as he had some gastritis as well or small  gastric erosions.  He has asymptomatic cholelithiasis.   He has history of alcoholic cirrhosis, well compensated at this point.   PLAN:  1. Suggest gradual weight loss of 10-15 pounds.  2. Omeprazole 20 mg daily #31 with 11 refills.  3. He should have an ultrasound and AFP at q.6 months.  4. Office visit 6 months with Dr. Jena Gauss or sooner if needed.  5. Colonoscopy at age 19.       Lorenza Burton, N.P.  Electronically Signed     R. Roetta Sessions, M.D.  Electronically Signed    KJ/MEDQ  D:  03/10/2007  T:  03/10/2007  Job:  [id number]   cc:   [name],  M.D. 

## 2010-11-06 NOTE — H&P (Signed)
[name], [name]               ACCOUNT NO.:  [id number]   MEDICAL RECORD NO.:  [id number]          PATIENT TYPE:  INP   LOCATION:  A326                          FACILITY:  [facility]   PHYSICIAN:  Melvyn Novas, MDDATE OF BIRTH:  1960/11/15   DATE OF ADMISSION:  01/18/2008  DATE OF DISCHARGE:  LH                              HISTORY & PHYSICAL   The patient is a 50 year old white male, apparently lives at home with  his mother, longstanding.  He had a cholecystectomy 2 weeks prior to  admission.  According to his mother, he is just not alert and being  himself.  His p.o. intake is down.  Upon seeing the patient in the  emergency room, his tongue is moist and well hydrated.  He answers  questions appropriately; however, he does have slow mentation.  He was  found to have a low potassium of 2.3, which was repleted in the ER, and  we will follow up BMETs serially.  The patient was admitted.  He had  some cellulitis on his leg.  Antibiotics were stopped several days ago  by primary doctor.  Consideration given to septicemia and narcotic  analgesia.  The patient will be admitted to find out the possible  multifactorial causes of some lethargy and altered mental status.  There  is no associated nausea, vomiting, melena, or hematochezia.   PAST MEDICAL HISTORY:  Significant for general anxiety disorder, bipolar  disorder, hip fracture, and cellulitis of the legs.   PAST SURGICAL HISTORY:  Remarkable for status post cholecystectomy 2  weeks prior to admission.   CURRENT MEDICINES:  1. Abilify 20 mg p.o. daily.  2. Cymbalta 60 mg 2 p.o. at bedtime.  3. Fentanyl patch 100 mcg q.48 h.  4. Lithium carbonate 300 mg t.i.d.  5. Oxycodone, unknown dosage.  6. Xanax 0.5 mg q.i.d.   PHYSICAL EXAMINATION:  VITAL SIGNS:  Blood pressure 117/69, temperature  97.9, pulse is 86 and regular, respiratory rate is 18, and O2 sat is  96%.  HEENT:  Extraocular movements intact.  Sclerae clear.   Conjunctivae  pink.  NECK:  No JVD.  No carotid bruits.  No thyromegaly.  No thyroid bruits.  LUNGS:  Diminished breath sounds at the bases.  No rales, wheeze, or  rhonchi appreciable.  HEART:  Regular rhythm.  No murmur, gallops, heaves, thrills, or rubs.  ABDOMEN:  Soft and nontender.  Bowel sounds are normoactive.  No  guarding or rebound.  No hepatosplenomegaly.  EXTREMITIES:  Diffuse erythema and hyperemia of both legs.  Some  excoriations and possible stasis dermatitis.  NEUROLOGIC:  Cranial nerves II-XII grossly intact.  The patient has some  slow mentation.  No focal abnormalities.  No depression.   ASSESSMENT:  1. Altered mental status.  2. Status post cholecystectomy 2 weeks prior to admission.  3. Hypokalemia, 2.3.  4. Cellulitis on the legs, status post treatment for 1 month.  5. Multiple sedating medicines.   PLAN:  Admit, reduce and diminish some sedating medicines, get TSH, and  observe for signs of septicemia, and we will make further  recommendations as the database expands.      Melvyn Novas, MD  Electronically Signed     RMD/MEDQ  D:  01/18/2008  T:  01/19/2008  Job:  [id number]
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
## 2010-11-06 NOTE — Assessment & Plan Note (Signed)
NAMEPORFIRIO, BOLLIER                CHART#:  [id number]   DATE:  06/11/2007                       DOB:  Jul 20, 1960   Followup dysphagia secondary to Schatzki's ring.  History of EtOH  pancreatitis/hepatitis.  Last seen in the office 03/10/2007.  He had  just undergone an EGD by me 02/16/2007 and was found to have a prominent  Schatzki's ring and a hiatal hernia at 56 [employer] dilator passed  through surgery on Prilosec.  He is doing extremely well status post  dilation.  No dysphagia, reflux symptoms have been squelched with  omeprazole 20 mg orally daily.  He has not been able to really lose any  significant amount of weight.  He is not having any abdominal pain,  melena, hematochezia, constipation, or diarrhea.  It has been a good 5-  1/2 years since he consumed any alcohol.  His LFTs were previously noted  to be normal.  He did have an ultrasound done, which revealed mild extra  and central hepatic biliary dilation, but his LFTs again were normal.  He was noted to have a cholelithiasis, but no evidence of cholecystitis.   OTHER MEDICAL PROBLEMS:  Avascular necrosis right hip.  Alcoholic  hepatitis.  Pancreatitis.  Bipolar disorder.  Umbilical herniorrhaphy.  Anxiety and neurosis.  COPD.   CURRENT MEDICATIONS:  See updated list.   EXAM:  He is accompanied by his mother.  He is ambulating with a walker.  Weight 172, height 5 feet 7, temperature 97.4, BP 110/68, pulse 76.  Skin warm and dry.  There is no jaundice.  CHEST AND LUNGS:  Clear to auscultation.  CARDIAC:  Regular rate and rhythm.  Without murmur, rub, or gallop.  ABDOMEN:  Nondistended.  Positive bowel sounds.  Soft.  Nontender.  No  appreciable mass or organomegaly.  EXTREMITIES:  No edema.   ASSESSMENT:  Schatzki's ring status post dilation, resulting in relative  resolution of his dysphagia.  Current symptoms are controlled on  omeprazole 20 mg oral daily.  Mild biliary dilation on ultrasound with  normal  LFTs.  History of acute pancreatitis.  He has cholelithiasis, but  no evidence of choledocholithiasis clinically.  This is silent.   RECOMMENDATIONS:  1. We will plan to see him back in 15 months, at that time, he will be      at the threshold for cancer screening and we will set him up for      colonoscopy.  2. Continue omeprazole 20 mg orally daily.  3. We have him in to recheck LFTs in 6 months just to make sure that      they are normal.       R. Roetta Sessions, M.D.  Electronically Signed     RMR/MEDQ  D:  06/11/2007  T:  06/12/2007  Job:  [id number]   cc:   [name], M.D.

## 2010-11-06 NOTE — Discharge Summary (Signed)
NAMEMIKIAH, DEMOND               ACCOUNT NO.:  [id number]   MEDICAL RECORD NO.:  [id number]          PATIENT TYPE:  INP   LOCATION:  A326                          FACILITY:  [facility]   PHYSICIAN:  Edward L. Juanetta Gosling, M.D.DATE OF BIRTH:  12-08-1960   DATE OF ADMISSION:  01/18/2008  DATE OF DISCHARGE:  07/28/2009LH                               DISCHARGE SUMMARY   FINAL DISCHARGE DIAGNOSES:  1. Altered mental status.  2. Status post cholecystectomy.  3. Hypokalemia.  4. Chronic cellulitis of the legs.  5. Cirrhosis of the liver.  6. Active hepatitis.  7. History of chronic pain.  8. Hypothyroidism.  9. Chronic obstructive pulmonary disease.  10.Hip replacement.  11.Bipolar.   HISTORY:  Mr. Encinas is a 50 year old who has been living at home with  his mother and who has a long-standing history of chronic pain, has a  history of cirrhosis of the liver, and COPD.  He has had multiple  problems with severe pain and he has been on multiple medications, which  have been tapered.  He has also had aseptic necrosis of the hip and had  had a hip replacement.  He developed increasing confusion and was  brought to the emergency room because of altered mental status.  He has  had a recent cholecystectomy.   PHYSICAL EXAMINATION:  VITAL SIGNS:  Blood pressure 117/69, temp 97.9,  pulse is 86 and regular, respirations 18, and O2 sat was 96%.  CHEST:  Relatively clear with some rhonchi.  HEART:  Regular.  ABDOMEN:  Soft.  No masses felt.  His organs were not palpable.  NEUROLOGICAL:  He was actually intact as for as any focal findings.   He was found to have a potassium of 2.3 and actually after he left the  hospital his TSH came back at greater than 30, so he needs to have  thyroid replacement.   HOSPITAL COURSE:  His potassium was replaced.  He had remarkable  improvement in his mental status very quickly.  He had a conference with  the GI team because of his active hepatitis, they felt that  he had  hepatitis C, and  he was discharged home in improved condition to remain on previous  medications, which include Abilify 20 mg daily, Cymbalta 60 mg 2 at  bedtime, fentanyl patch 100 mcg every 48 hours, lithium carbonate 300 mg  t.i.d., oxycodone 15 mg 4 times a day as needed, and Xanax 0.5 q.i.d.      Edward L. Juanetta Gosling, M.D.  Electronically Signed     ELH/MEDQ  D:  01/20/2008  T:  01/21/2008  Job:  [id number]

## 2010-11-06 NOTE — Discharge Summary (Signed)
[name], [name]               ACCOUNT NO.:  [id number]   MEDICAL RECORD NO.:  [id number]          PATIENT TYPE:  INP   LOCATION:  1621                         FACILITY:  [facility]   PHYSICIAN:  Ollen Gross, M.D.    DATE OF BIRTH:  1960-07-10   DATE OF ADMISSION:  04/13/2007  DATE OF DISCHARGE:  04/15/2007                               DISCHARGE SUMMARY   He was actually readmitted at short-term after his previous  hospitalization that I dictated.  So there is a second discharge summary  for a second.  hospitalization.   ADMITTING DIAGNOSES:  1. Draining right hip incision following total hip.  2. Bipolar disorder/manic depression.  3. History of alcohol use and abuse, stopped five years ago.  4. Chronic obstructive pulmonary disease.  5. Alcoholic liver cirrhosis.  6. History of pancreatitis.  7. Nocturnal oxygen dependency.   DISCHARGE DIAGNOSES:  1. Right hip hematoma status post irrigation debridement, right hip      hematoma.  2. Draining right hip incision following total hip.  3. Bipolar disorder/manic depression.  4. History of alcohol use and abuse, stopped five years ago.  5. Chronic obstructive pulmonary disease.  6. Alcoholic liver cirrhosis.  7. History of pancreatitis.  8. Nocturnal oxygen dependency.   PROCEDURE:  Irrigation debridement right hip hematoma.  Surgeon Dr.  Lequita Halt.  Assistant Avel Peace PA-C.  Anesthesia general.   CONSULTS:  None.   BRIEF HISTORY:  [name] is a 46-year male who had a right hip arthroplasty  done about 2 weeks prior to admission.  He was on Coumadin for DVT  prophylaxis.  His mother noticed a significant amount of drainage from  the incision days prior to the admission.  He was admitted to the  hospital, placed on vancomycin.  His admitting INR was 2.2 on the  morning, gave him vitamin K once he came down, and we took him to the OR  for irrigation and debridement.   LABORATORY DATA:  Preop labs showed CBC:  White count of  18.9,  hemoglobin 10.6, hematocrit 32.4.  PT/INR 24.1/2.1, PTT of 71.  Follow-  up INR down to 1.60.  He was taken to surgery at that point.  Postop CBC  on April 14, 2007:  Hemoglobin 8.7, hematocrit 26.9, B-met within  normal limits.  Cultures taken at time of surgery: Follow-up cultures  were negative at the time of discharge.  Please note, I do not have  final culture reports on this chart.   HOSPITAL COURSE:  He was admitted to [facility] for the above  stated problem.  He was admitted by Dr. Patsy Lager, one with Dr.  Deri Fuelling partners.  He was placed on antibiotics and placed at bedrest.  It was felt that he may require incision and drainage of the hip, so we  kept him in the hospital.  Over the weekend, he received labs.  Monitoring his INR it was 2.1 on admission, therefore he could not have  surgery at that point anyway.  Dressings were changed.  His white count  was noted to be elevated at  18.9 on admission.  Dr. Lequita Halt came in the  following morning, on October 20, he was evaluated and felt that he  would require incision and drainage.  Felt like he had a hematoma there  that was draining.  We reversed his coumadinization with vitamin K once  it came down.  He was taken to the operating room later that day and  underwent the above procedure without difficulty.  He was given p.o. and  IV analgesics and performed PCA.  Tolerated procedure well, went to the  recovery room back up to the orthopedic floor.  He was actually doing  pretty comfortable on the morning of day #1.  It was mainly a hematoma  found to the time of surgery, cultures being checked after surgery were  essentially negative.  Did not see any obvious signs of infection.  It  is felt like he may have just bled into that area.  He may have gotten  supratherapeutic.  We do not have the labs, but it is felt that he just  bled into the soft tissues.  We allowed him to be weightbearing as  tolerated.   Hemovac drain was placed at time of surgery and was left in  until day two.  He was up moving around on day #1 and day #2.  Dressing  was changed on day #2, incision looked good.  Hemovac drain was removed.  He did have a little bit of a rash, we put him on a Medrol Dosepak.  He  was doing well and was discharged home on April 15, 2007.   DISCHARGE/PLAN:  1. Discharged home on April 15, 2007.  2. Discharge diagnoses: Please see above.  3. Discharge medications: Medrol Dosepak and Robaxin.  4. Diet: Resume home diet.  5. Activities: Weightbearing as tolerate.  Total weight protocol.      Dressing changes.  Reinforce as needed.  Follow-up 2 weeks.  Follow-      up in one week.   DISPOSITION:  Home.   CONDITION ON DISCHARGE:  Improved.      [name], P.A.C.      Ollen Gross, M.D.  Electronically Signed    ALP/MEDQ  D:  05/20/2007  T:  05/20/2007  Job:  [id number]   cc:   Ollen Gross, M.D.  Fax: [phone]

## 2010-11-06 NOTE — H&P (Signed)
NAME:  [name], [name]               ACCOUNT NO.:  [id number]   MEDICAL RECORD NO.:  [id number]          PATIENT TYPE:  AMB   LOCATION:  DAY                           FACILITY:  [facility]   PHYSICIAN:  Dalia Heading, M.D.  DATE OF BIRTH:  02/06/1961   DATE OF ADMISSION:  DATE OF DISCHARGE:  LH                              HISTORY & PHYSICAL   CHIEF COMPLAINT:  Cholelithiasis, cholecystitis, history of cirrhosis.   HISTORY OF PRESENT ILLNESS:  The patient is a 50 year old white male who  was referred for evaluation and treatment of biliary colic secondary to  cholelithiasis.  He has been having intermittent right upper quadrant  abdominal pain with radiation to the flank, nausea, and indigestion for  the past few weeks.  It seems to be getting worse.  He has a known  history of cholelithiasis.  No fever, chills, or jaundice have been  noted.   PAST MEDICAL HISTORY:  Includes, stable cirrhosis, history of lung  carcinoma, history of depressive disorder.   PAST SURGICAL HISTORY:  Umbilical herniorrhaphy in 2005, appendectomy,  he had a recent hip replacement.   CURRENT MEDICATIONS:  1. Chlormeprazine 2.5 mg p.o. q.6.h p.r.n. nausea.  2. Lithium 300 mg p.o. everyday.  3. Cymbalta 60 mg p.o. everyday.  4. Avelox 400 mg p.o. everyday.  5. Furosemide 40 mg p.o. everyday.  6. Doxycycline 100 mg p.o. b.i.d.  7. Oxycodone 15 mg p.o. q.12.h.  8. Alprazolam p.r.n.   ALLERGIES:  PENICILLIN AND VANCOMYCIN.   REVIEW OF SYSTEMS:  The patient smokes 2 packs cigarettes a day.  He  denies any alcohol use.  He did not drink alcohol in many years.  Denies  any recent chest pain, MI, CVA, and diabetes mellitus.   PHYSICAL EXAMINATION:  GENERAL:  The patient is a well-developed, well-  nourished white male in no acute distress.  HEENT EXAMINATION:  Reveals no scleral icterus.  LUNGS:  Clear to auscultation with the breath sounds bilaterally.  HEART EXAMINATION:  Reveals regular rate and rhythm  without S3, S4, or  murmurs.  ABDOMEN:  Soft, nontender, and nondistended.  No hepatosplenomegaly,  masses, hernias identified.  Ultrasound of the gallbladder reveals  cholelithiasis with a normal common bile duct.   IMPRESSION:  Cholecystitis, cholelithiasis, history of cirrhosis.   PLAN:  The patient is scheduled for laparoscopic cholecystectomy with  needle biopsy on January 01, 2008.  The risks and benefits of the procedure  including bleeding, infection, hepatobiliary injury, the possibility of  an open procedure were fully explained to the patient, gave him informed  consent.      Dalia Heading, M.D.  Electronically Signed     MAJ/MEDQ  D:  12/29/2007  T:  12/30/2007  Job:  [id number]   cc:   [name], M.D.  Fax: [phone]   R. Roetta Sessions, M.D.  [address]    [place] [id number]

## 2010-11-06 NOTE — Discharge Summary (Signed)
[name], [name]               ACCOUNT NO.:  [id number]   MEDICAL RECORD NO.:  [id number]          PATIENT TYPE:  INP   LOCATION:  1615                         [facility]AC[facility]   PHYSICIAN:  Ollen Gross, M.D.    DATE OF BIRTH:  September 21, 1960   DATE OF ADMISSION:  03/30/2007  DATE OF DISCHARGE:  04/04/2007                               DISCHARGE SUMMARY   ADMISSION DIAGNOSES:  1. 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  2. Bipolar disorder/manic depression.  3. History of chronic alcohol use/abuse, stopped 5 years ago.  4. Chronic obstructive pulmonary disease.  5. Alcoholic liver cirrhosis.  6. Past history of pancreatitis.  7. History of cellulitis right leg.   DISCHARGE DIAGNOSES:  1. Osteonecrosis right hip, status post right total hip arthroplasty.  2. Mild postop blood loss anemia.  3. Hyponatremia.  4. 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  5. Bipolar disorder/manic depression.  6. History of chronic alcohol use/abuse stopped 5 years ago.  7. Chronic obstructive pulmonary disease.  8. Alcoholic liver cirrhosis.  9. Past history of pancreatitis.  10.History of cellulitis right leg.   PROCEDURE:  March 30, 2007, right total hip.  Surgeon:  Dr. Lequita Halt  assisted by Greggory Brandy.  Anesthesia general.   CONSULTATIONS:  None.   BRIEF HISTORY:  [name] is a 46-year male with severe end-stage arthritis  secondary to avascular necrosis with significant subchondral sclerosis,  __________ collapse confirmed on MRI, now presents for total hip  arthroplasty.   LABORATORY:  Preop CBC showed a low hemoglobin 12.3, hematocrit 38.3,  white cell count 12.6.  Postop hemoglobin down to 10.2, then came up to  10.7.  Last H&H is 10.4 and 31.0.  PT/PTT preop 12.6 and 34  respectively.  INR 0.9.  Serial pro-times followed.  Last PT/INR 24.7  and 2.28.  Chem panel on admission:  Low albumin of 3.2, slightly  elevated CO2 of 34.  Remaining chem  panel within normal limits.  Serial  BMETs were followed.  Sodium did drop from 137 to 133, glucose went up  from 81 to 138, CO2 dropped from 34 to 28.  Preop UA negative.  Blood  group type O negative.   DIAGNOSTICS:  1. EKG March 24, 2007:  Normal sinus rhythm, normal EKG.  No      previous confirmed by Dr. Lewayne Bunting.  2. Hip films on March 24, 2007:  Findings consistent with AVN      right hip.  3. Postop portable hip/pelvis film:  Satisfactory appearance of right      total hip on March 30, 2007.   HOSPITAL COURSE:  The patient was admitted to [facility] and  tolerated the procedure well, later transferred to the recovery room on  the orthopedic floor.  Started on p.o. and IV analgesic for pain control  following surgery.  He had extensive chronic pain medications which were  started postoperatively.  He is actually doing pretty well.  Did have  some pain.  He  was given a PCA, but he was not using it much, so we  encouraged him to use the p.o. meds.  He started back on his  psychotropic medications for bipolar disorder.  He is also on Fentanyl  patches for chronic pains.  We watched him to make sure he did not get  oversedated. Hemoglobin was 10.2.  Started getting up out of bed.  On  the morning of day 2, he was a little sedated.  Therefore we switched  his medications around.  Felt it was due to his chronic medications, but  also the acute short-term medicines that we were using.  Pain was  getting better.  Following the surgery, he did not have that pain  stimuli from the AVN any more, so we backed off his medications.  Hemoglobin stable at 10.7.  Starting getting up slowly, doing a little  bit of therapy, but requiring moderate assist at that time.  Therefore,  we felt like he was going to need a little bit more time.  By day 3, he  was much better, much more alert, back to his baseline like he was  before surgery.  Due to his slow progression, we felt  that we needed to  increase his weightbearing.  Dr. Lequita Halt increased him to weightbearing  as tolerated and hoped that would help him progress even faster.  Incision was changed on day 2 and changed daily.  He was doing very  well.  The increasing weight bearing did actually help him.  He got up  and walk about 50 feet on day 3.  By day 4, he continued to improve.  INR became therapeutic.  Felt like he just needed a little bit more  therapy, so we held him to April 04, 2007.  He was tolerating his  meds, progressing well and was discharged home.   DISCHARGE/PLAN:  The patient discharged home on April 04, 2007.   DISCHARGE MEDICATIONS:  1. Nicoderm patch.  2. Robaxin.  3. Coumadin.  4. He had pain medications at home.   FOLLOW UP:  Follow up in 2 weeks.   ACTIVITY:  Weightbearing as tolerated.  Total hip protocol.  Home health  PT and home health nursing.   DISPOSITION:  Home.   CONDITION ON DISCHARGE:  Improving.      Alexzandrew L. Perkins, P.A.C.      Ollen Gross, M.D.  Electronically Signed    ALP/MEDQ  D:  05/20/2007  T:  05/20/2007  Job:  [id number]

## 2010-11-06 NOTE — Assessment & Plan Note (Signed)
NAME:  [name], [name]                CHART#:  [id number]   DATE:  01/25/2008                       DOB:  May 05, 1961   CHIEF COMPLAINT:  Followup of hospitalization, cirrhosis.   SUBJECTIVE:  The patient is here for a followup visit.  We saw him  during recent hospitalization.  We were asked to see the patient  regarding active hepatitis seen on a recent liver biopsy.  He is known  to have cirrhosis and it has been felt that this is related to prior  heavy alcohol use.  However, he quit about 6 years ago.  His liver  biopsy on January 01, 2008, which was done at time of cholecystectomy  revealed chronic active hepatitis with cirrhosis, grade 2, stage III to  IV.  There was no amount of significant iron stores.  There was no other  specifics about the biopsy.  During his hospitalization, he did have a  hepatitis C antibody that was reactive.  He had a HCV RNA done, which  was negative.  His hepatitis B surface antigen was negative as well.  He  has not had any further workup of his cirrhosis.  He initially was  diagnosed with cirrhosis around 2003 at which time he states he was put  on hospice care.  When he quit drinking, he did improve.   He presents today stating that he is having increased difficulty with  lower extremity edema.  He was treated for lower extremity edema and  cellulitis as an outpatient and when I actually saw him in the hospital  last week, his legs look pretty good.  He is still not able to eat solid  food.  He states he is eating chicken noodle soup about 3 and 4 cans a  day.  He denies nausea or vomiting.  He states his abdominal pain is  improved since his gallbladder was removed.  He still has some soreness.  He states mostly he has muscle aches and pains.  Bowel movements are  regular.  Denies any blood in stool.  He has been more alert since he  has been home.  He is ambulating some.  He still requires assistance  with his bath.  According to his mother who takes  care of him, he loves  to eat salt and puts salt on everything he eats.   CURRENT MEDICATIONS:  He states he is on Duragesic 100 mcg patches 3  every 3 days.  He states he is on Xanax 2 mg q.i.d., Cymbalta 120 mg  daily, lithium 300 mg t.i.d., ProAir p.r.n., oxycodone pills p.r.n., and  potassium daily, has 4 more days.   ALLERGIES:  Penicillin and vancomycin.   PHYSICAL EXAMINATION:  VITAL SIGNS:  Weight 167 pounds.  His blood  pressure 110/70, pulse 64, and temp 98.  GENERAL:  A pleasant, alert, and cooperative Caucasian gentleman, in no  acute distress.  He is accompanied by his mother and sister.  SKIN:  Warm and dry.  No jaundice.  HEENT:  Sclerae nonicteric.  Oropharyngeal mucosa is moist and pink.  CHEST:  Lungs are clear auscultation.  CARDIAC:  Regular rate and rhythm.  ABDOMEN:  Positive bowel sounds.  Abdomen soft, nontender, and  nondistended.  No organomegaly or masses.  No abdominal hernias.  EXTREMITIES:  Lower extremities, 3+ pitting  edema to his knees  bilaterally.  His legs are shiny.  There are some peeling skin.  No  obvious erythema, however.   IMPRESSION:  The patient is a 50 year old gentleman with history of  cirrhosis with recent hospitalization for mental status changes.  During  his hospital stay, he was noted to have a TSH level around 30.  Apparently, he had an abnormal TSH level previously, but failed to pick  up his Synthroid prescription according to Dr. Juanetta Gosling.  The patient  appears to be back to baseline regarding his mental status.  He now has  mostly issues with lower extremity edema again.  He is not on a salt-  restricted diet at this point.  His diuretics had been held, but I feel  at this point we need to restart them.  With his history of cellulitis,  he is at a risk of having recurrence with the amount of edema that is  present on exam today.  He recently had a positive hepatitis C antibody  but negative RNA.  We will pursue one last  confirmation test to make  sure there was no lab error at any point.  The patient is at high risk  for hepatitis C given his prior history.  Family is really adamant about  excluding the possibility of hepatitis C.   PLAN:  1. A 2 g sodium-restricted diet.  2. HCV RIBA.  3. CBC and CMET.  Based on his electrolytes, we will go ahead and      start on Aldactone today and stop his potassium.  We will await      blood work prior to calling in the prescription.  4. Office visit in 4 weeks or sooner if needed.  5. Please note CBC has been done because of persistently elevated      white count over the last year.  We would like to recheck this at      this point.  6. We will also check his hepatitis A and B immune status.       Tana Coast, P.A.  Electronically Signed     R. Roetta Sessions, M.D.  Electronically Signed    LL/MEDQ  D:  01/25/2008  T:  01/25/2008  Job:  [id number]   cc:   [name], M.D.

## 2010-11-06 NOTE — Assessment & Plan Note (Signed)
NAME:  [name], [name]                CHART#:  [id number]   DATE:  05/11/2008                       DOB:  1961/05/16   CHIEF COMPLAINT:  Followup of diarrhea.   SUBJECTIVE:  The patient is here for a office visit.  He continues to  have diarrhea.  We last saw him at the time of his colonoscopy and EGD  on 03/09/2008.  He had an empirical dilatation of the esophagus, small  hiatal hernia, antral erosions, multiple colonic polyps, poor prep.  No  evidence of colitis is seen on prior CT.  Biopsies revealed tubular  adenomas in the colon.  H. pylori serologies were negative.  He had a C.  diff toxin titer, which was positive and he took Flagyl for 7 days.  He  has had ongoing diarrhea consistent with 2-4 loose-yellow stools daily.  He also had an endoscopic ultrasound on 04/07/2008.  This was normal  except for lobular pancreas, but otherwise no parenchymal or ductal  signs of chronic pancreatitis.  We did another stool culture, which was  negative as well as 3 C. diffs, which were negative.  He is also  concerned about ongoing weight loss.  In August, he weighed 155 pounds,  he is down to 137 pounds.  A part of this was fluid at least.  He had 2-  3+ pitting edema when he weighed 155 pounds.  He denies any heartburn.  No nausea or vomiting.  His appetite has markedly improved.  According  to the mother, he is eating very well.  He has chronic abdominal pain in  the upper abdomen.   CURRENT MEDICATIONS:  See updated list.   ALLERGIES:  Penicillin and vancomycin.   PHYSICAL EXAMINATION:  VITAL SIGNS:  Weight 137, temp 97.9, blood  pressure 128/84, and pulse 80.  GENERAL:  A pleasant, alert, cooperative Caucasian gentleman in no acute  distress.  He is accompanied by his mother.  SKIN:  Warm and dry.  No jaundice.  HEENT:  Sclerae nonicteric.  Oropharyngeal mucosa moist and pink.  CHEST:  Lungs are clear to auscultation.  CARDIOVASCULAR:  Regular rate and rhythm.  ABDOMEN:  Positive  bowel sounds.  Abdomen is soft and nondistended.  Mild tenderness in the epigastrium.  No rebound or guarding.  No  organomegaly or masses.  No abdominal hernia or bruits.  LOWER EXTREMITIES:  No edema.  He has evidence of chronic venous stasis,  however.   IMPRESSION:  The patient is a 50 year old gentleman with history of  cirrhosis, heavy alcohol use in the past, but none in the past 5-6  years.  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  Workup has essentially been unremarkable.  He  has had an elevated alkaline phosphatase.  Please see prior notes for  details of workup.  His major concern today is that of diarrhea.  Clostridium difficile x3 is negative.  Question whether diarrhea is  secondary to chronic pancreatitis.  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  His ongoing weight lost is somewhat concerning.  We need to  check for thyroid dysfunction.  At least some of his drop in weight is  related to diuresis.  He has a history of cirrhosis likely alcohol  related, which  is stable.   PLAN:  1. He will complete his hepatitis A and B vaccinations in March as      planned.  2. Omeprazole 20 mg daily, #30 with 11 refills.  3. IBS Advantage one daily for probiotic effects, #30 samples      provided.  4. Creon 24,000 two with meals and one with snacks, #240 with 5      refills.  5. CMET and TSH.  6. Further recommendations to follow.       Tana Coast, P.A.  Electronically Signed     R. Roetta Sessions, M.D.  Electronically Signed    LL/MEDQ  D:  05/11/2008  T:  05/12/2008  Job:  [id number]   cc:   [name], M.D.

## 2010-11-06 NOTE — Op Note (Signed)
NAME:  [name], [name]               ACCOUNT NO.:  [id number]   MEDICAL RECORD NO.:  [id number]          PATIENT TYPE:  AMB   LOCATION:  DAY                           FACILITY:  [facility]   PHYSICIAN:  R. Roetta Sessions, M.D. DATE OF BIRTH:  [birth year]   DATE OF PROCEDURE:  02/16/2007  DATE OF DISCHARGE:                               OPERATIVE REPORT   PROCEDURE:  Esophagogastroduodenoscopy with Elease Hashimoto dilation.   INDICATIONS FOR PROCEDURE:  A 50 year old with a one-year history of  esophageal dysphagia, history of GERD.  He has undergone esophageal  dilation previously by Dr. Karilyn Cota.  It was done around 1997.  He had  some gastritis at that time.  CLO test was negative.  EGD is now being  done with plans for esophageal dilation.  This approach has been  discussed with the patient at length and caregivers.  Potential risks,  benefits and alternatives have been reviewed and questions answered.  Please see documentation on the medical record.   PROCEDURE NOTE:  O2 saturation, blood pressure, pulses, and respirations  were monitored throughout the entire procedure.  Conscious sedation with  Demerol 100 mg IV, Versed 5 mg IV, Phenergan 25 mg diluted slow IV push  to augment conscious sedation. Cetacaine spray for topical oropharyngeal  anesthesia.   INSTRUMENTATION:  Pentax video chip system.   FINDINGS:  Examination of the tubular esophagus revealed a prominent  Schatzki's ring, otherwise esophageal mucosa appeared normal.  The EG  junction was easy to traverse with the scope.   STOMACH:  Gastric cavity was empty and insufflated well with air.  A  thorough examination of the gastric mucosa included a retroflexed view  of the proximal stomach and esophagogastric junction, demonstrated a  couple of antral erosions and a small hiatal hernia, otherwise the  gastric mucosa appeared normal.  The pylorus was patent and easily  transversed.  Examination of the bulb and second portion revealed  no  abnormalities.   THERAPEUTICS/DIAGNOSTIC MANEUVERS PERFORMED:  A 56 French Maloney  dilator was passed to full insertion with ease.  The patient was  somewhat combative with some difficulty passing the dilator and looking  back, but overall, both these maneuvers were done uneventfully.  A look-  back revealed the ring had been ruptured without any apparent  complication.  Patient overall tolerated the procedure well and was  reactive.   ENDOSCOPY IMPRESSION:  A prominent Schatzki's ring, otherwise normal  esophagus, status post dilation of the disruption, described above.  A  small hiatal hernia, tiny antral erosions, otherwise normal stomach,  patent pylorus, normal D1, D2.   RECOMMENDATIONS:  1. Begin Prilosec 20 mg orally daily.  2. Patient is to let us know if he has any difficulty swallowing in      the future.      Jonathon Bellows, M.D.  Electronically Signed     RMR/MEDQ  D:  02/16/2007  T:  02/16/2007  Job:  [id number]   cc:   [name], M.D.  Fax: [phone]

## 2010-11-06 NOTE — Group Therapy Note (Signed)
NAMEADRIEL, KESSEN               ACCOUNT NO.:  [id number]   MEDICAL RECORD NO.:  [id number]          PATIENT TYPE:  INP   LOCATION:  A338                          FACILITY:  [facility]   PHYSICIAN:  Edward L. Juanetta Gosling, M.D.DATE OF BIRTH:  Apr 30, 1961   DATE OF PROCEDURE:  01/01/2008  DATE OF DISCHARGE:                                 PROGRESS NOTE   Mr. Nevills is better.  He has no new complaints.  His potassium is up to  3.8.  CBC shows his white count is down to 18,000, still up but much  less, platelets 229 and overall he looks much better.   ASSESSMENT:  He has severe chronic obstructive pulmonary disease.  He  has cirrhosis of the liver.  He has cholelithiasis.  He has cellulitis  of the legs.  He has been hypokalemic, but that seems to be better.  I  am going to restart his oxygen.  He has been using that at home anyway  and continue with all of his other treatments.  He is set for surgery  later today.      Edward L. Juanetta Gosling, M.D.  Electronically Signed     ELH/MEDQ  D:  01/01/2008  T:  01/01/2008  Job:  [id number]

## 2010-11-06 NOTE — Op Note (Signed)
NAMECHAVEZ, ROSOL               ACCOUNT NO.:  [id number]   MEDICAL RECORD NO.:  [id number]          PATIENT TYPE:  OBV   LOCATION:  1606                         FACILITY:  [facility]   PHYSICIAN:  Erasmo Leventhal, M.D.DATE OF BIRTH:  1960-07-19   DATE OF PROCEDURE:  05/28/2007  DATE OF DISCHARGE:                               OPERATIVE REPORT   PREOPERATIVE DIAGNOSIS:  Dislocation right total hip arthroplasty.   POSTOPERATIVE DIAGNOSIS:  Dislocation right total hip arthroplasty.   PROCEDURE:  Closed reduction of right dislocated total hip arthroplasty.  Exam under anesthesia.  C-arm radiography.   SURGEON:  Dr. Valma Cava.   ASSISTANT:  Leilani Able, PA.   ANESTHESIA:  General.   ESTIMATED BLOOD LOSS:  None.   COMPLICATIONS:  None.   DISPOSITION:  PACU stable.   OPERATIVE DETAILS:  The patient was counseled in the holding area, taken  to the operating room, placed under general anesthesia.  Placed in the  operating room table.  Utilizing gentle longitudinal traction, followed  by flexion, internal rotation, the hip was reduced..  Leg lengths were  symmetric at end of the case.  Circulation remained intact.  Exam under  anesthesia could flex to 90 degrees.  He only had about 10 and 20  degrees of internal external rotation..  C-arm radiography confirmed the  hip was reduced anatomically.  There was no adverse or complicating  factors.  He was placed in knee immobilizer, awakened, taken from  operating room to PACU in stable condition.  No complications or  problems.           ______________________________  Erasmo Leventhal, M.D.     RAC/MEDQ  D:  05/28/2007  T:  05/29/2007  Job:  [id number]

## 2010-11-06 NOTE — H&P (Signed)
[name], Smith               ACCOUNT NO.:  [id number]   MEDICAL RECORD NO.:  [id number]          PATIENT TYPE:  INP   LOCATION:  A338                          FACILITY:  [facility]   PHYSICIAN:  Edward L. Juanetta Gosling, M.D.DATE OF BIRTH:  1961-01-25   DATE OF ADMISSION:  12/30/2007  DATE OF DISCHARGE:  LH                              HISTORY & PHYSICAL   Mr. [name] has been admitted because of elevated white blood cell count  in the face of upcoming gallbladder surgery.  His situation is somewhat  complicated.  He is a 50 year old who has a history of cirrhosis of the  liver.  He has had severe abdominal pain for many years.  He has been on  multiple medications, and he has lately been able to decrease his  medications.  He had a recent evaluation which showed that he had  cholelithiasis.  He has had a history of COPD.  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  He has  had recent cellulitis of both legs, this is being treated at home with  doxycycline and Avelox and has improved greatly.   His past medical history includes the stable cirrhosis and COPD.  Dr.  Lovell Sheehan had put that he had a history of lung cancer that is not  correct.  There was a worry about lung cancer when he was seen at [facility], but that had never been proven.  He does have a history of  depression and a history of chronic pain.   Surgically, he has had umbilical hernia repair, an appendectomy, and the  hip replacement.   He is currently taking:  1. Lithium 300 mg 3 times a day.  2. Duragesic patch.  3. Oxycodone 15 mg every 4 hours as needed for pain.  4. Cymbalta 60 mg 2 tablets at bedtime.  5. Xanax 0.5 every 6 hours p.r.n.  6. He has been taking Phenergan because of nausea.  He has had some      significant nausea and vomiting.   He is allergic to PENICILLIN and VANCOMYCIN.   His social history, he smokes about 2 packs of cigarettes daily.  He has  a long  known history of alcohol abuse but has not had alcohol in years.  He lives at home with family.   His family history is positive for COPD.   His review of systems except as mentioned is negative.  He did get  sunburn, which I think is worse because he was on Avelox.  I am going to  switch him from Avelox to Levaquin because the Levaquin will get [place] a  little bit of coverage for abdominal problems.  I am going to have him  get blood cultures x2, a urine culture, and a sputum culture if  available.   PHYSICAL EXAMINATION:  GENERAL:  He is awake and alert.  He is sleepy,  which is a chronic situation.  VITAL SIGNS:  His temperature is 98.6, pulse 94, respirations 20, blood  pressure 108/77, and O2 sat  is 96%.  HEENT:  His pupils are reactive.  His nose and throat are clear.  NECK:  Supple without masses.  CHEST:  Some rhonchi bilaterally.  HEART:  Regular without gallop.  ABDOMEN:  Shows that he has some right upper quadrant tenderness.  EXTREMITIES:  No lymphedema, but he has sunburn on his arm and on legs.  He has still some cellulitis of the legs but much less so than before.   He had a white blood count of 21,000 from blood work done at Dr.  York Ram office today, which has precipitated his admission.  He also  has a potassium of 2.5.  He does not have a good source of the white  blood cell count, unless it is from his liver and his gallbladder, so we  are going to recheck lab work, check blood cultures and sputum culture,  and check a chest x-ray.  Check liver functions and follow.  I will  continue his regular home medications and then decide what to do from  there.      Edward L. Juanetta Gosling, M.D.  Electronically Signed     ELH/MEDQ  D:  12/30/2007  T:  12/31/2007  Job:  [id number]

## 2010-11-06 NOTE — H&P (Signed)
[name], [name]               ACCOUNT NO.:  [id number]   MEDICAL RECORD NO.:  [id number]          PATIENT TYPE:  INP   LOCATION:  1508                         FACILITY:  [facility]   PHYSICIAN:  Georges Lynch. Gioffre, M.D.DATE OF BIRTH:  [birth year]   DATE OF ADMISSION:  04/30/2007  DATE OF DISCHARGE:  05/01/2007                              HISTORY & PHYSICAL   CHIEF COMPLAINT:  Severe right hip pain.   HISTORY OF PRESENT ILLNESS:  Patient is a 50 year old gentleman who is a  right total hip arthroplasty patient by Dr. Darrelyn Hillock, which was performed  on March 30, 2007.  Patient was at home this evening when he was  falling asleep in the chair.  He was going to get up when he had severe  onset of pain in his right hip.  He was unable to ambulate around.  They  called for advice.  Dr. Darrelyn Hillock recommended that he call an ambulance  and transport to the hospital.  He received hospital evaluation with x-  rays, showing that he has a superior dislocated right total hip  arthroplasty.  Patient will be admitted for closed reduction.   ALLERGIES:  PENICILLIN.   CURRENT MEDICATIONS:  Duragesic patch, 3 patches, 100 mcg apiece.  Xanax, Cymbalta, lithium, ProAire inhaler, oxycodone, Abilify, Robaxin.   PAST MEDICAL HISTORY:  1. Bipolar disorder, panic, depression.  2. History of alcohol abuse, reported to be stopped five years ago.  3. COPD.  4. Alcoholic liver cirrhosis.  5. History of pancreatitis.  6. Nocturnal oxygen dependency.   PAST SURGICAL HISTORY:  Hernia repair, pin placement in the leg,  appendectomy.   SOCIAL HISTORY:  Patient is divorced.  A 1-1/2 pack per day smoker.  He  is cared for by his mother and grandmother.   REVIEW OF SYSTEMS:  Unremarkable at this time other than the patient's  severe pain.  Denied any chest pain.  RESPIRATORY:  Shortness of breath.  Patient is very drowsy at this time due to narcotics for severe pain.   PHYSICAL EXAMINATION:  Patient is a  well-developed gentleman, healthy-  appearing, slightly centrally obese.  He appears to be slightly  lethargic.  He complains of severe pain in his right hip.  He holds his  hip in a slightly flexed position, shortened, compared to the left leg.  HEENT:  Head is normocephalic.  He has dentures in place.  He has good  range of motion of his neck.  CHEST:  Lung sounds were clear.  HEART:  Regular rate and rhythm.  ABDOMEN:  Round, soft.  LOWER EXTREMITIES:  His right hip and leg was shortened and slightly  flexed at the hip.  He had severe pain with any attempts at range of  motion.  He had good pulses in the foot.  Left lower extremity had good  range of motion.  BREASTS/RECTAL/GU:  Deferred at this time.   X-RAY:  He had a superior dislocated right total hip arthroplasty.   IMPRESSION:  1. Dislocated hip arthroplasty, right hip.  2. History of bipolar disease.  3. History of alcohol abuse.  4. History of pancreatitis.  5. History of alcoholic cirrhosis.  6. History of chronic obstructive pulmonary disease.  7. History of nocturnal oxygen dependency.   DISPOSITION:  At this time, patient will be admitted to [facility]  under the care of Dr. Ranee Gosselin.  Patient will be taken to the OR,  where a closed reduction will be performed.  He will be put in a brace,  and a followup and evaluation on the next rounds by Dr. Despina Hick for  further treatment.      Jamelle Rushing, P.A.    ______________________________  Georges Lynch Darrelyn Hillock, M.D.    RWK/MEDQ  D:  05/01/2007  T:  05/01/2007  Job:  [id number]

## 2010-11-06 NOTE — Group Therapy Note (Signed)
[name], [name]               ACCOUNT NO.:  [id number]   MEDICAL RECORD NO.:  [id number]          PATIENT TYPE:  INP   LOCATION:  A338                          FACILITY:  [facility]   PHYSICIAN:  Edward L. Juanetta Gosling, M.D.DATE OF BIRTH:  [birth year]   DATE OF PROCEDURE:  01/02/2008  DATE OF DISCHARGE:                                 PROGRESS NOTE   ADDENDUM   [name] white count is up to 28,000.  I suspect from manipulation of  his gallbladder and the biopsy.      Edward L. Juanetta Gosling, M.D.  Electronically Signed     ELH/MEDQ  D:  01/02/2008  T:  01/02/2008  Job:  [id number]

## 2010-11-06 NOTE — Op Note (Signed)
NAMELUSTER, HECHLER               ACCOUNT NO.:  [id number]   MEDICAL RECORD NO.:  [id number]          PATIENT TYPE:  INP   LOCATION:  1528                         FACILITY:  [facility]   PHYSICIAN:  Ollen Gross, M.D.    DATE OF BIRTH:  04/22/1961   DATE OF PROCEDURE:  08/21/2007  DATE OF DISCHARGE:                               OPERATIVE REPORT   PREOPERATIVE DIAGNOSIS:  Recurrent dislocations, right hip.   POSTOPERATIVE DIAGNOSIS:  Recurrent dislocations, right hip.   PROCEDURE:  Right hip acetabular revision to constrain liner.   SURGEON:  Ollen Gross, M.D.   ASSISTANT:  Alexzandrew L. Perkins, P.A.C.   ANESTHESIA:  General.   ESTIMATED BLOOD LOSS:  800   DATA REVIEWED:  Hemovac x1.   COMPLICATIONS:  Stable to recovery.   BRIEF CLINICAL NOTE:  [name] is a 50 year old male who had a right  total hip arthroplasty done last summer.  He has had four dislocations.  His components appear to be in good position radiographically.  Given  his dislocations, he now presents for revision potentially to constrain  liner.   PROCEDURE IN DETAIL:  After successful administration of general  anesthetic, the patient was placed in the left lateral decubitus  position with the right side up and held with the hip positioner.  The  right lower extremity was isolated from his perineum with plastic drapes  and prepped and draped in the usual sterile fashion.   The previous posterolateral incision was reutilized.  The skin cut with  a 10 blade through subcutaneous tissue to the level of the fascia lata  which was incised in line with the skin incision.  There was some  hematoma present from his recent dislocation.  The soft tissues  posterior are already dissected from the dislocation.  I had to get him  into position of 90 degrees flexion, 40 degrees adduction, and about 60  degrees internal rotation to get him dislocated.  When I did dislocate  him, I removed the femoral head.  The femur  was in good position, with  normal anteversion.  We retracted the femur anteriorly.  It was obvious  that the stem was going to be in the way of extracting the acetabular  liner, thus I used the S-ROM chisel to disrupt the taper between the  stem and the sleeve and easily remove the stem.  We then retracted the  femur anteriorly to gain acetabular exposure.   The cup was in excellent position in about 20 degrees of anteversion and  40 degrees of abduction, matching the patient's native anatomy.  We used  the bone tamp to tap the edge of the cup to remove the metal liner.  It  was a 54 x 36 liner.  Since the components were all well-fixed and in  good position and the patient had problems with compliance, it was  decided to go ahead and put a constrained liner in.  I put the Pinnacle  constrained liner for the 54 acetabular component.  This had a 32  internal diameter.  The liner was impacted and found to  be well-seated.  I then we inserted the S-ROM femoral stem into the femur, matching the  previous anteversion.  A 32 + 0 head was placed, and the ball was  reduced.  The locking ring was then impacted around the ridge of the  liner.  It was found to be snapped into position and well-fixed.  I  placed him through a range of motion, and it would allow for full  extension, 20 degrees external rotation, 70 degrees flexion, 40 degrees  adduction, about 30 degrees of internal rotation, 90 degrees of flexion  and about 40 degrees of internal rotation.  He was stable throughout  this range of motion.  The wound was then copiously irrigated saline  solution and the fascia lata closed over a Hemovac drain with  interrupted #1 Vicryl.  Subcu closed with 1-0 and 2-0 Vicryl and the  skin with staples.  Drains hooked to suction.  Incision cleaned and  dried and bulky sterile dressing applied.  He was placed into a knee  immobilizer, awakened, and transported to recovery in stable condition.       Ollen Gross, M.D.  Electronically Signed     FA/MEDQ  D:  08/21/2007  T:  08/23/2007  Job:  [id number]

## 2010-11-06 NOTE — Discharge Summary (Signed)
NAMEELADIO, DENTREMONT               ACCOUNT NO.:  [id number]   MEDICAL RECORD NO.:  [id number]          PATIENT TYPE:  INP   LOCATION:  1528                         FACILITY:  [facility]   PHYSICIAN:  Ollen Gross, M.D.    DATE OF BIRTH:  July 21, 1960   DATE OF ADMISSION:  08/19/2007  DATE OF DISCHARGE:  08/25/2007                               DISCHARGE SUMMARY   ADMISSION DIAGNOSES:  1. Recurrent dislocations right total hip.  2. Bipolar disorder.  3. History of alcohol use abuse.  4. Chronic obstructive pulmonary disease.  5. Alcoholic liver cirrhosis.  6. History of pancreatitis.  7. Nocturnal oxygen dependency.  8. History of depression.  9. History of closed head injury.   DISCHARGE DIAGNOSES:  1. Recurrent dislocations right hip status post right hip acetabular      revision conversion over to constrained liner.  2. Postop blood loss anemia.  3. Bipolar disorder.  4. History of alcohol use abuse.  5. Chronic obstructive pulmonary disease.  6. Alcoholic liver cirrhosis.  7. History of pancreatitis.  8. Nocturnal oxygen dependency.  9. History of depression.  10.History of closed head injury.   PROCEDURE:  1. August 21, 2007, right hip acetabular revision to a constrained      liner.  Surgeon, Dr. Lequita Halt.  Assistant, Avel Peace PA-C.      Anesthesia general.  Consults none.  2. August 19, 2007, the patient underwent a closed reduction of the      right total hip arthroplasty.  Surgeon, Dr. Lequita Halt; no assistant,      under general anesthesia.   BRIEF HISTORY:  Jorja Loa is a 50 year old male who has previously undergone a  right total hip last summer.  He unfortunately had four dislocations.  His third one was only two days prior to admission and was discharged  home on the 24th.  Unfortunately sustained a fourth dislocation while in  abduction brace at home and was readmitted on the 25th due to the  recurrent dislocations instability of the hip.  He was brought in for  reduction and had a constrained liner ordered for revision.   LABORATORY DATA:  Labs prior to admission on the previous admission  showed CBC with hemoglobin was 12.6, hematocrit 38.1, white cell count  16.5, platelets.  PT/INR 13.6 and 1 with PTT of 33.  BMET at that time  showed elevated glucose of 125, blood BUN 5.  Remaining BMET within  normal limits.  Serial CBCs were followed.  Follow-up CBC showed  hemoglobin down 10.4, went back up to 12.2, drifted down to 9, got as  low as 8.3, given one unit of blood.  Last H&H was 10 and 29.7.  Serial  protimes followed.  He was placed on Coumadin following the constrained  liner.  Last noted PT/INR 30.1 and 2.8.  Serial BMETs were followed.  Electrolytes remained within normal limits.  He did have a Chem panel  done to check his liver function test on August 20, 2007.  His  alkaline phos was normal at 84, his SGOT was normal at 14, his SGPT was  normal at 9, total protein low at 5.8, total albumin low at 2.5, calcium  was 8.8.   X-RAYS:  Initial films on admission showed a dislocation of the right  hip prosthesis.  He had a portable film postop that showed reduction of  the right total hip.  Portable pelvis on August 21, 2007, showed post  revision of hip, no complicating features.   HOSPITAL COURSE:  The patient was admitted to [facility] on  August 19, 2007, for a dislocated hip.  He was taken to OR and  underwent the above procedure #2, which was the closed reduction of the  hip.  He was placed at bedrest and started back on his home medications.  Due to the recurrent dislocations and instability, it was felt that he  would require constrained liner that had to be special ordered, so he  was placed into the hospital and waited for liner to come.  His mother  and grandmother were concerned about his color on admission and they  were concerned that his liver was acting out.  We did order liver  function tests, Chem panel  on postop day #1 of August 20, 2007.  His  LFTs were checked and all were within normal limits.  Hemoglobin at that  time was 10.4.  He was preoped once the liner came in and he was taken  the operating room on August 21, 2007, and underwent above procedure  #1, which was the acetabular revision conversion of her constrained  liner.  He tolerated the procedure well, later transferred her back to  the med/surgical floor for continued care.  He was doing pretty well on  the morning of day #1.  He was placed in knee immobilizer initially  postoperatively.  Physical therapy was consulted to assist with gait  training and ambulation.  He started on Coumadin for DVT prophylaxis.  Did pretty well throughout the weekend.  He used PCA initially and was  discontinued on postop day #2 and he was put back on his oral oxycodone  elixir.  By day #3, he was doing well.  Unfortunately, his hemoglobin  had dropped down to 8.3.  It was felt that he would benefit from  undergoing transfusion.  We gave him 1 unit of packed cells, he  tolerated the blood well and his hemoglobin came back up to 10.  He was  seen on rounds on day #4 and incision was healing well.  The patient was  progressing and back to his baseline and wanted to go home, arrangements  are being made and he was discharged home later that morning.   DISCHARGE/PLAN:  1. Patient discharged home on August 25, 2007.  2. Discharge diagnoses:  Please see above.  3. Discharge medications:  He is placed on Coumadin for DVT      prophylaxis, also Robaxin for spasm and he will return to his home      medications.  4. Diet:  Resume previous home diet.  5. Activity:  Can be weightbearing as tolerated to the right lower      extremity.  He is to have his hip abduction brace on at all times      until he follows up.  He may remove or loosen for hygiene and      bathing.  6. Follow-up in one week, on Tuesday, September 01, 2007.   DISPOSITION:  Home with  family.   CONDITION ON DISCHARGE:  Improving.  Alexzandrew L. Perkins, P.A.C.      Ollen Gross, M.D.  Electronically Signed    ALP/MEDQ  D:  08/25/2007  T:  08/25/2007  Job:  [id number]   cc:   [name], M.D.  Fax: [phone]

## 2010-11-06 NOTE — Op Note (Signed)
[name], [name]               ACCOUNT NO.:  [id number]   MEDICAL RECORD NO.:  [id number]          PATIENT TYPE:  INP   LOCATION:  1621                         FACILITY:  [facility]   PHYSICIAN:  Ollen Gross, M.D.    DATE OF BIRTH:  1960-09-15   DATE OF PROCEDURE:  DATE OF DISCHARGE:                               OPERATIVE REPORT   PREOPERATIVE DIAGNOSIS:  Right hip hematoma.   POSTOPERATIVE DIAGNOSIS:  Right hip hematoma.   PROCEDURE:  Irrigation of the right hip hematoma.   SURGEON:  Ollen Gross, M.D.   ASSISTANT:  Avel Peace PA-C   ANESTHESIA:  General.   BLOOD LOSS:  Minimal.   DRAINS:  Hemovac times one.   COMPLICATIONS:  None.   CONDITION:  Stable to recovery.   BRIEF CLINICAL NOTE:  [name] is a 50 year old male who had a right total  hip arthroplasty done two weeks ago.  He has been on Coumadin for DVT  prophylaxis.  His mother noted a significant amount of drainage from his  incision two days ago.  He was admitted to the hospital and placed on  vancomycin.  His INR was 2.2 this morning.  I gave him Vitamin K to get  down to 1.6.  He comes the operating room now for irrigation and  debridement.   PROCEDURE IN DETAIL:  After successful administration of general  anesthetic, the patient was placed in the left lateral decubitus  position with the right side up and his right lower extremity was  isolated from his perineum with plastic drapes and prepped and draped in  usual sterile fashion.  I used the superior aspect of his previous  incision.  Skin cut with a 10 blade through subcutaneous tissue.  There  was a large hematoma in the subcutaneous tissue.  It did not look  infected.  We sent the fluid for Gram stain C&S.  After the hematoma was  evacuated.  The fluid did not appear to track down beyond the fascia.  I  irrigated with about 2 liters of saline with pulsatile lavage.  All the  tissue looked healthy.  I then made a small incision in the fascia to  see  if there is a fluid deep to it and there was a normal amount of  fluid.  No evidence of any deep hematoma.  We thoroughly irrigated and  closed the fascia with  interrupted #1 Vicryl.  Hemovac drain is placed the subcu tissue and we  closed with interrupted #1 Vicryl and interrupted 2-0 Vicryl and then  running 4-0 Monocryl.  The drain was hooked to suction.  Incision  cleaned and dried.  Steri-Strips and bulky sterile dressing applied.  He  is awakened, transported to recovery in stable condition.      Ollen Gross, M.D.  Electronically Signed     FA/MEDQ  D:  04/13/2007  T:  04/14/2007  Job:  [id number]

## 2010-11-09 NOTE — Group Therapy Note (Signed)
   NAME:  [name], [name]                         ACCOUNT NO.:  [id number]   MEDICAL RECORD NO.:  [id number]                   PATIENT TYPE:  INP   LOCATION:  A321                                 FACILITY:  [facility]   PHYSICIAN:  Edward L. Juanetta Gosling, M.D.             DATE OF BIRTH:  04/23/1961   DATE OF PROCEDURE:  06/01/2002  DATE OF DISCHARGE:  06/01/2002                                   PROGRESS NOTE   PROBLEM:  1. Liver failure.  2. Constipation.  3. COPD.   SUBJECTIVE:  The patient is about the same.  He did have a large bowel  movement today and he is much happier as far as his overall situation is  concerned.   OBJECTIVE:  His exam today shows that he looks much more comfortable.  His  heart is regular, his abdomen is soft.   ASSESSMENT:  He is much improved.   PLAN:  For discharge home today.  Please see discharge summary for details.  Dr. Lovell Sheehan' help is noted and appreciated.                                               Edward L. Juanetta Gosling, M.D.    ELH/MEDQ  D:  06/01/2002  T:  06/02/2002  Job:  [id number]

## 2010-11-09 NOTE — Group Therapy Note (Signed)
   NAME:  [name], [name]                         ACCOUNT NO.:  [id number]   MEDICAL RECORD NO.:  [id number]                   PATIENT TYPE:  INP   LOCATION:  A222                                 FACILITY:  [facility]   PHYSICIAN:  Edward L. Juanetta Gosling, M.D.             DATE OF BIRTH:  07/31/1960   DATE OF PROCEDURE:  DATE OF DISCHARGE:  06/09/2002                                   PROGRESS NOTE   SUBJECTIVE:  [name] was admitted last night with obstipation again. He  has had this in the past and had been discharged to home after having enemas  to clear and developed problems again.   OBJECTIVE:  His examination this morning shows that he is sleepy but his  abdomen is soft. He has had very large bowel movements since he has been  here. At this point, I am not quite sure what we can do to help him. His  mother has suggested a colonoscopy which I do not feel is indicated,  considering his overall situation.   ASSESSMENT:  At this point, no change in treatment plan.   PLAN:  I will get a curb side consult with GI to see if they have any  ideas about his constipation.                                               Edward L. Juanetta Gosling, M.D.    ELH/MEDQ  D:  06/09/2002  T:  06/10/2002  Job:  [id number]

## 2010-11-09 NOTE — Discharge Summary (Signed)
   NAME:  [name], [name]                         ACCOUNT NO.:  [id number]   MEDICAL RECORD NO.:  [id number]                   PATIENT TYPE:  INP   LOCATION:  A222                                 FACILITY:  [facility]   PHYSICIAN:  Edward L. Juanetta Gosling, M.D.             DATE OF BIRTH:  [birth year]   DATE OF ADMISSION:  06/08/2002  DATE OF DISCHARGE:  06/09/2002                                 DISCHARGE SUMMARY   FINAL DISCHARGE DIAGNOSES:  1. Chronic constipation.  2. 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  3. Chronic obstructive pulmonary disease.  4. Hypothyroidism.  5. Bipolar disorder.   HISTORY OF PRESENT ILLNESS:  [name] is a 50 year old who is actually on  the Hospice Program and who has been having increasing problems with  constipation for the last week. He had been hospitalized at [facility] about one week ago and was discharged after finally having a bowel  movement. His examination on admission showed that he was in considerable  pain. His abdomen was somewhat distended. His chest, although was fairly  clear without wheezes. His heart was regular.   HOSPITAL COURSE:  He was given enemas and had excellent results. He was  discussed at the Hospice Case Conference and it was elected to send him home  on GoLYTELY. Have him take 8 ounces every 30 minutes every day until he  starts having bowel movements and see if we can keep him from having to be  readmitted for constipation.                                               Edward L. Juanetta Gosling, M.D.    ELH/MEDQ  D:  06/09/2002  T:  06/10/2002  Job:  [id number]

## 2010-11-09 NOTE — H&P (Signed)
[name], [name]               ACCOUNT NO.:  [id number]   MEDICAL RECORD NO.:  [id number]           PATIENT TYPE:   LOCATION:                                 FACILITY:   PHYSICIAN:  Dalia Heading, M.D.  DATE OF BIRTH:  August 06, 1960   DATE OF ADMISSION:  DATE OF DISCHARGE:  LH                                HISTORY & PHYSICAL   CHIEF COMPLAINT:  Epigastric pain.   HISTORY OF PRESENT ILLNESS:  The patient is a 50 year old white male with  multiple medical problems including lung carcinoma and cirrhosis who now  presents with epigastric pain. The pain is also in the left upper quadrant  and radiates across his upper abdomen. No nausea, vomiting, or hematemesis  have been noted.   PAST MEDICAL HISTORY:  1.  Cirrhosis.  2.  History of lung carcinoma.  3.  History of depressive disorder.   PAST SURGICAL HISTORY:  1.  Appendectomy.  2.  Umbilical herniorrhaphy.   CURRENT MEDICATIONS:  1.  Lithium 300 mg p.o. q.d.  2.  Diazepam 10 mg p.o. q.d.  3.  Duragesic patch.  4.  Trazodone 100 mg p.o. q.d.   ALLERGIES:  PENICILLIN.   REVIEW OF SYSTEMS:  The patient does smoke. He drinking any alcohol  recently.   PHYSICAL EXAMINATION:  GENERAL:  The patient is an alert and oriented white  male in no acute distress.  LUNGS:  Clear to auscultation with equal breath sounds bilaterally.  HEART:  Reveals regular rate and rhythm without S3, S4, or murmurs.  ABDOMEN:  The abdomen is distended with some ascites but is not tense. It is  soft and nontender.   IMPRESSION:  Epigastric pain, cirrhosis.   PLAN:  The patient is scheduled for an EGD on March 16, 2004. The risks  and benefits of the procedure were fully explained to the patient who gave  informed consent.     Mark   MAJ/MEDQ  D:  03/15/2004  T:  03/15/2004  Job:  [id number]   cc:   [facility]  Fax: [phone]   Oneal Deputy. Juanetta Gosling, M.D.  [address]  [place]  [place] [address]  Fax: [phone]

## 2010-11-09 NOTE — H&P (Signed)
NAME:  [name], [name]                         ACCOUNT NO.:  [id number]   MEDICAL RECORD NO.:  [id number]                   PATIENT TYPE:  INP   LOCATION:  A222                                 FACILITY:  [facility]   PHYSICIAN:  Hanley Hays. Dechurch, M.D.           DATE OF BIRTH:  June 26, 1960   DATE OF ADMISSION:  06/08/2002  DATE OF DISCHARGE:                                HISTORY & PHYSICAL   HISTORY OF PRESENT ILLNESS:  The patient is a 50 year old Caucasian male  followed by Dr. Juanetta Gosling with a past medical history remarkable for alcoholic  liver disease with end-stage cirrhosis who is being managed by hospice.  He  is on multiple pain medications and was recently discharged from the  hospital on December 9 after a one-day stay for obstipation.  Apparently,  with enemas and laxatives, his symptoms were better and he was discharged  home one week ago.  Since that time, he has not had a bowel movement despite  attempts to do so at home.  He was seen by the hospice nurse today, who  apparently was unable to disimpact the patient.  He was brought to the  emergency room for further evaluation.  Abdominal film reveals copious  stools and air fluid levels though he has good bowel sounds and has a  partial bowel obstruction.  He has no nausea or vomiting.  He is alert, at  his baseline mental status, though he is quite uncomfortable secondary to  the rectal pain and pressure.  He is being admitted to the hospital for  disimpaction and institution of an aggressive bowel regimen to hopefully  prevent further recurrences of this most distressing problem.  The patient  denies any nausea or vomiting.  His appetite has been fair.  His edema is  unchanged.  He states he has been taking his medications consistently since  discharge.   MEDICATIONS:  1. OxyContin 40 mg q.i.d.  2. Oxycodone 2 which he takes with each OxyContin and then q.4h. p.r.n.     breakthrough.  3. Valium 10 mg t.i.d. and p.r.n.  4. Trazodone 200 mg q.h.s.  5. Ambien p.r.n.  6. MiraLax 17 g daily.  7. Dulcolax 3 tablets daily.  8. Magnesium citrate.  He has had 3 bottles in the last 48 hours to no     avail.  9. Aldactone 50 mg b.i.d.   ALLERGIES:  None known.   PAST MEDICAL HISTORY:  As noted above.  Hospice since August 2003, bipolar  disorder.  Apparently he has been noncompliant with his medical regimen in  the past.  History of alcohol and tobacco abuse and chronic pancreatitis.   FAMILY HISTORY:  Noncontributory; however, his father died with cirrhosis.   SOCIAL HISTORY:  Divorced, two children, lives with his mother who is his  primary caregiver.  He smokes about one pack per day.  Denies any alcohol at  this time.  PHYSICAL EXAMINATION:  GENERAL:  A well-developed, well-nourished white male  who is alert, able to answer questions.  He is uncomfortable secondary to  his perirectal pain.  VITAL SIGNS:  Blood pressure 120/70, pulse is 90 and regular, respirations  are unlabored.  Exam is limited due to the patient's inability to cooperate  fully.  LUNGS:  Diminished at the bases.  HEART:  Regular.  ABDOMEN:  Distended.  He has positive ascites.  He is tympanitic superiorly  with percussion although his belly is relatively soft, considering.  EXTREMITIES:  3-4+ edema which extends to the thighs.  There is no skin  breakdown.  NEUROLOGIC:  Nonfocal.  No asterixis is noted.   ASSESSMENT AND PLAN:  1. Obstipation with partial bowel obstruction on x-ray though good bowel     sounds and no nausea and vomiting.  We will begin mineral oil enemas and     milk and molasses enemas, as well as MiraLax and Senokot with p.o.     analgesics.  It is important that the patient take his Senokot along with     his p.o. medications, as this should prevent this from occurring again.     He can take upwards of 16-20 Senokot a day if needed, although I suspect     with MiraLax that that may not be as much of an issue.   We discussed the     need at length with his mother, grandmother, and aunt regarding     prevention and treatment.  2. Chronic pain, multifactorial.  He continues with hospice.  Duragesic may     be a good option for this patient with Dilaudid or methadone for     breakthrough pain although I am not sure he will be compliant with     changes.  3. Cirrhosis with ascites and edema.  Continue his Aldactone, add Lasix.  We     are giving him some intravenous fluids at this point but we will stop     after the first liter.  He does not appear to be clinically dehydrated     nor are his labs remarkable.  Interestingly, even his CBC does not show     pancytopenias I would expect at this stage of liver disease.  4. Bipolar disorder with history of medicine noncompliance.  He has been on     Lithium.  He does not appear to be Lithium toxic at this time.  I am     going to continue his fluids for the next 24 hours and defer     discontinuation to the primary physician.   The patient is a Do Not Resuscitate.  He will be followed by hospice upon  discharge and this will be honored.                                               Hanley Hays Josefine Class, M.D.    FED/MEDQ  D:  06/08/2002  T:  06/09/2002  Job:  [id number]

## 2010-11-09 NOTE — Op Note (Signed)
NAME:  [name], [name]                         ACCOUNT NO.:  [id number]   MEDICAL RECORD NO.:  [id number]                   PATIENT TYPE:  AMB   LOCATION:  DAY                                  FACILITY:  [facility]   PHYSICIAN:  Dalia Heading, M.D.               DATE OF BIRTH:  Oct 30, 1960   DATE OF PROCEDURE:  07/01/2003  DATE OF DISCHARGE:                                 OPERATIVE REPORT   PREOPERATIVE DIAGNOSIS:  Umbilical hernia.   POSTOPERATIVE DIAGNOSIS:  Umbilical hernia.   PROCEDURE:  Umbilical herniorrhaphy.   SURGEON:  Dalia Heading, M.D.   ANESTHESIA:  Spinal.   INDICATIONS FOR PROCEDURE:  The patient is a 50 year old white male with  multiple medical problems including lung carcinoma and cirrhosis who now  presents with an intractable umbilical hernia.  The risks and benefits of  the procedure, including bleeding, infection, and recurrence of the hernia  were fully explained to the patient who gave informed consent.   DESCRIPTION OF PROCEDURE:  The patient was placed in the supine position  after spinal anesthesia was administered.  Xylocaine 1% was used for local  anesthesia to supplement the spinal.  The abdomen was prepped and draped  using the usual sterile technique with Betadine.  Surgical site confirmation  was performed.   An infraumbilical incision was made down to the fascia.  The umbilicus was  freed away from the underlying fascia.  The hernia sac was then excised  without difficulty.  The umbilical hernia was repaired using 0 Surgidac  interrupted sutures.  No adjoining epiphyseal was noted.  The base of the  umbilicus was secured to the fascia using a 3-0 Vicryl interrupted suture.  The subcutaneous layer was reapproximated using a 3-0 Vicryl interrupted  suture.  The skin was closed using staples.  Betadine ointment and dry  sterile dressings were applied.   All tape and needle counts were correct at the end of the procedure.  The  patient was  transferred to the PACU in stable condition.   COMPLICATIONS:  None.   SPECIMENS:  None.   ESTIMATED BLOOD LOSS:  Minimal.      ___________________________________________                                            Dalia Heading, M.D.   MAJ/MEDQ  D:  07/01/2003  T:  07/01/2003  Job:  [id number]   cc:   [name], M.D.  [address]  [place]  [place] [address]  Fax: [phone]

## 2010-11-09 NOTE — Discharge Summary (Signed)
NAME:  [name], [name]                         ACCOUNT NO.:  [id number]   MEDICAL RECORD NO.:  [id number]                   PATIENT TYPE:  INP   LOCATION:  A321                                 FACILITY:  [facility]   PHYSICIAN:  Edward L. Juanetta Gosling, M.D.             DATE OF BIRTH:  08/14/1960   DATE OF ADMISSION:  05/30/2002  DATE OF DISCHARGE:  06/01/2002                                 DISCHARGE SUMMARY   FINAL DISCHARGE DIAGNOSES:  1. Obstipation and constipation.  2. Cirrhosis of the liver, end-stage.  3. Chronic obstructive pulmonary disease.  4. Bipolar disorder.  5. Hypothyroidism.   HISTORY OF PRESENT ILLNESS:  The patient is a 50 year old who is actually on  hospice benefit because of his cirrhosis.  He has well-known severe  cirrhosis of the liver, has had episodes of jaundice related to this, and is  now in hospice though he continues to drink alcohol and is end-stage.  He  has been hospitalized on several occasions for treatment evaluation of his  cirrhosis and he is hospitalized now because of constipation which is  probably related to pain medication.  He has been on a large number of pain  medications for what I think is probably pain due to expansion of the  capsule of his liver.  In addition to the problems with his liver he has  severe COPD; he smokes about two to three packs of cigarettes daily.  He has  bipolar disorder for which he refuses to take any medication; hypothyroidism  for which he refuses to take any medication.  His constipation finally got  to the point that he came to the emergency room for evaluation after having  several efforts at home to treat the constipation with other means that  hospice attempted.   PHYSICAL EXAMINATION ON ADMISSION:  CHEST:  Relatively clear with some  rhonchi bilaterally.  HEART:  Regular without gallop.  ABDOMEN:  The distended liver was palpable and there was no stool palpable  in his colon.   HOSPITAL COURSE:  He was  given laxatives and enemas, finally had a very  large bowel movement and felt much better.  At that point he had  ____________   but that he might want to give MiraLax on a daily basis.   MEDICATIONS:  He is discharged home on:  1. Aldactone 50 mg p.o. b.i.d.  2. Desyrel 200 mg at night.  3. Lithium 600 mg b.i.d. - I am not sure he is actually going to take this.  4. OxyContin 40 mg p.o. q.i.d.  5. Ambien 10 mg p.o. q.h.s. p.r.n. sleep.  6. Valium 10 mg p.o. b.i.d. on a p.r.n. basis.  7.     Vicodin one q.i.d. p.r.n. pain.  8. MiraLax one scoop in water daily.   DISPOSITION:  He is going to follow with the hospice program.  Edward L. Juanetta Gosling, M.D.    ELH/MEDQ  D:  06/01/2002  T:  06/02/2002  Job:  [id number]

## 2010-11-09 NOTE — H&P (Signed)
NAME:  [name], [name]                         ACCOUNT NO.:  [id number]   MEDICAL RECORD NO.:  [id number]                   PATIENT TYPE:  INP   LOCATION:  A321                                 FACILITY:  [facility]   PHYSICIAN:  Gracelyn Nurse, M.D.              DATE OF BIRTH:  [birth year]   DATE OF ADMISSION:  05/30/2002  DATE OF DISCHARGE:                                HISTORY & PHYSICAL   CHIEF COMPLAINT:  No bowel movement for several days.   HISTORY OF PRESENT ILLNESS:  This is a 50 year old white male with a history  of cirrhosis who presents with the complaint of mild abdominal pain and no  bowel movement in several days.  He has tried laxatives and enemas, with no  success.  He also complains of increased abdominal girth and lower extremity  edema.  He has had some nausea but no vomiting.   PAST MEDICAL HISTORY:  1. Alcoholic cirrhosis.  2. Chronic pancreatitis.  3. Bipolar disease.   ALLERGIES:  PENICILLIN.   CURRENT MEDICATIONS:  1. Oxytocin 40 mg q.i.d.  2. Vicodin 5 mg q.4h. p.r.n.  3. Valium 10 mg b.i.d. p.r.n.  4. Trazodone 200 mg q.h.s.   SOCIAL HISTORY:  Tobacco:  One-and-one-half packs per day.  Alcohol:  He has  had none for the past couple of weeks.  He is divorced, with two children.   FAMILY HISTORY:  Mother has thyroid problems.  Father died of alcoholic  cirrhosis.   REVIEW OF SYSTEMS:  As per HPI.   PHYSICAL EXAMINATION:  VITAL SIGNS:  Temperature 97.6, pulse 96,  respirations 20, blood pressure 108/71.  GENERAL:  Well-nourished white male in no acute distress.  HEENT:  Pupils are equal, round, and reactive to light.  Extraocular  movements intact.  Oral mucosa is moist.  Oropharynx is clear.  CARDIOVASCULAR:  Regular rate and rhythm.  With no murmurs.  LUNGS:  Clear to auscultation.  ABDOMEN:  Soft, nontender.  Bowel sounds are positive.  Mildly distended,  possible fluid wave.  EXTREMITIES:  With 3+ lower extremity edema.  NEUROLOGIC:  Cranial  nerves II-XII grossly intact.  No focal deficits.  SKIN:  Moist.  There are scattered petechiae on the upper extremities.   ADMISSION LABORATORY DATA:  White blood cell count 11.3, hemoglobin 11.6,  platelets 415.  Sodium 135, potassium 4.3, chloride 100, CO2 28, BUN 5,  creatinine 0.8, glucose 105.  Albumin 2.7, SGOT 25, SGPT 15, alkaline  phosphatase 65, total bilirubin 0.4.   Abdominal x-rays show lots of stool in the colon.  No sign of obstruction.   ASSESSMENT AND PLAN:  1. Obstipation:  This is likely secondary to narcotic pain medicines.  We     will try soap-suds enema and some MiraLax.  2. Ascites with anasarca:  Will go ahead and start him on some low-dose     spironolactone.  3. Cirrhosis:  Stable.  4. Bipolar disease:  Will continue the trazodone.                                                Gracelyn Nurse, M.D.    JDJ/MEDQ  D:  05/30/2002  T:  05/31/2002  Job:  [id number]

## 2010-11-09 NOTE — H&P (Signed)
NAME:  [name], Salminen NO.:  [id number]   MEDICAL RECORD NO.:  [id number]                  PATIENT TYPE:   LOCATION:                                       FACILITY:   PHYSICIAN:  Dalia Heading, M.D.               DATE OF BIRTH:  1960-10-18   DATE OF ADMISSION:  DATE OF DISCHARGE:                                HISTORY & PHYSICAL   CHIEF COMPLAINT:  Umbilical hernia.   HISTORY OF PRESENT ILLNESS:  The patient is a 50 year old white male with  multiple medical problems including lung carcinoma and cirrhosis, who now  presents with intractable pain from an umbilical hernia.  No nausea or  vomiting have been noted.  He does have an umbilical hernia with a small  epiplocele just superior to the umbilicus.  This has been followed for some  time, but the patient states the pain keeps recurring.   PAST MEDICAL HISTORY:  1. Cirrhosis which has stabilized recently.  2. History of lung carcinoma.  3. History of a depressive disorder.   PAST SURGICAL HISTORY:  Appendectomy.   MEDICATIONS:  1. Lithium 300 mg p.o. daily.  2. Diazepam 10 mg p.o. daily.  3. Duragesic patch.  4. Trazodone 100 mg p.o. daily.   ALLERGIES:  PENICILLIN.   REVIEW OF SYSTEMS:  The patient does smoke.  He has not had alcohol for over  a year and a half.  He has been recently seen by Dr. Shaune Pollack who felt  that he was medically stable to undergo an umbilical herniorrhaphy.   PHYSICAL EXAMINATION:  GENERAL APPEARANCE:  The patient is an alert and  oriented white male in no acute distress.  LUNGS:  Clear to auscultation with good breath sounds bilaterally.  HEART:  Regular rate and rhythm without S3, S4 or murmurs.  ABDOMEN:  Soft and not significantly distended.  A reducible and tender  umbilical hernia along with an epiplocele was noted.  No rigidity was noted.   IMPRESSION:  Umbilical hernia.   PLAN:  The patient was scheduled for umbilical herniorrhaphy on July 01, 2003.  The risks and benefits of the procedure including bleeding, infection  and possibly recurrence of the hernia were fully explained to the patient.  Gave informed consent.     ___________________________________________                                         Dalia Heading, M.D.   MAJ/MEDQ  D:  06/28/2003  T:  06/28/2003  Job:  [id number]   cc:   [name], M.D.  [address]  [place]  [place] [address]  Fax: [phone]

## 2010-11-09 NOTE — Discharge Summary (Signed)
NAME:  [name], [name]                         ACCOUNT NO.:  [id number]   MEDICAL RECORD NO.:  [id number]                   PATIENT TYPE:  INP   LOCATION:  A309                                 FACILITY:  [facility]   PHYSICIAN:  Isidor Holts, MD                 DATE OF BIRTH:  05/05/1961   DATE OF ADMISSION:  01/30/2002  DATE OF DISCHARGE:  01/30/2002                                 DISCHARGE SUMMARY   PRIMARY MEDICAL DOCTOR:  Dr. Fredirick Maudlin.   DISCHARGE DIAGNOSES:  1. Shortness of breath, likely exacerbation of chronic obstructive pulmonary     disease.  2. Hepatic cirrhosis.  3. Chronic pancreatitis.  4. Bipolar disorder.   DIET:  Two-gram sodium, low fat.   ACTIVITY:  As tolerated.   DISCHARGE MEDICATIONS:  1. Levaquin 500 mg p.o. q.d. for one week.  2. Albuterol MDI two puffs p.r.n. q.6h. for shortness of breath.  3. Home oxygen at 2 l/min nasal cannula.   FOLLOWUP:  Follow up with primary M.D., i.e., Dr. Juanetta Gosling, in one week's  time.   HISTORY AND PHYSICAL:  See dictated H&P.   INVESTIGATIONS:  CBC:  WBC 10.5, hemoglobin 15.5, hematocrit 46.3, platelet  count 338,000.  INR 1.3, PTT 42.  LFTs:  AST 46, ALT 23, alkaline  phosphatase 153, total bilirubin 0.5.  Chemistry:  Sodium 136, potassium  3.6, chloride 101, carbonate 29, glucose 104, BUN 2, creatinine 0.7, calcium  9.0.  D-dimer 1.0.   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   HOSPITAL COURSE:  This gentleman was admitted tentatively for observation  and management of his shortness of breath.  Pulmonary embolism has been  effectively ruled out, it is therefore deemed that this was due principally  to COPD with a possible chest infection.  He was therefore treated with  antibiotics, i.e., Levaquin 500 mg p.o. q.d. and bronchodilator nebulizers.  Also, arrangements have been put in place to commence him  on home oxygen  therapy.  He appeared to do well on inpatient oxygen therapy.  He was been  strongly cautioned to quit smoking.  He is a known alcoholic and he has been  therefore strongly cautioned to quit alcohol, in view of his history of  hepatic cirrhosis.  Clinically, he was stable throughout the period of  observation, became ambulatory, seemed quite well, no  longer complained of shortness of breath and is quite anxious to go home.  In view of the foregoing, he has been discharged accordingly on the above-  mentioned medications.  He has been advised to follow up with his primary  M.D.  Isidor Holts, MD    CO/MEDQ  D:  01/30/2002  T:  02/04/2002  Job:  [id number]   cc:   Fredirick Maudlin, M.D.

## 2010-11-09 NOTE — Group Therapy Note (Signed)
   NAME:  DANNIEL, TONES                         ACCOUNT NO.:  [id number]   MEDICAL RECORD NO.:  [id number]                   PATIENT TYPE:  INP   LOCATION:  A321                                 FACILITY:  [facility]   PHYSICIAN:  Edward L. Juanetta Gosling, M.D.             DATE OF BIRTH:  1960/11/18   DATE OF PROCEDURE:  DATE OF DISCHARGE:                                   PROGRESS NOTE   PROBLEMS:  Obstipation.   SUBJECTIVE:  The patient continues to have problems with being very  obstipated.  He has decided that he might like to have a colonoscopy done  now.  I have discussed this with him and with his family and I am going to  go ahead and ask Dr. Lovell Sheehan to take a look at him and see if there is  something we can do.  He has cirrhosis which is end-stage.  He is actually  on the hospice program.  I am not sure the cause of his abdominal pain,  although it may be swelling of the internal capsule of the liver.  I do not  plan any other new treatments now.                                               Edward L. Juanetta Gosling, M.D.    ELH/MEDQ  D:  05/31/2002  T:  05/31/2002  Job:  [id number]

## 2010-11-09 NOTE — Discharge Summary (Signed)
[name], [name]               ACCOUNT NO.:  [id number]   MEDICAL RECORD NO.:  [id number]          PATIENT TYPE:  INP   LOCATION:  1508                         FACILITY:  [facility]   PHYSICIAN:  Georges Lynch. Gioffre, M.D.DATE OF BIRTH:  Nov 03, 1960   DATE OF ADMISSION:  04/30/2007  DATE OF DISCHARGE:  05/01/2007                               DISCHARGE SUMMARY   ADMISSION DIAGNOSIS:  1. Dislocated right hip arthroplasty.  2. History of bipolar disease.  3. History of alcohol abuse.  4. History of pancreatitis.  5. History of alcoholic cirrhosis.  6. History of chronic obstructive pulmonary disease.  7. History of nocturnal oxygen dependency.   DISCHARGE DIAGNOSIS:  1. 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  2. Bipolar disease.  3. History of alcohol abuse.  4. History of pancreatitis.  5. History of alcoholic cirrhosis.  6. History of chronic obstructive pulmonary disease.  7. History of nocturnal oxygen dependency.   HISTORY OF PRESENT ILLNESS:  The patient is a 50 year old gentleman who  had a right total knee arthroplasty by Dr. Lequita Halt which was performed  in October 2008.  The patient was falling asleep in a chair, he twisted  his leg, he dislocated his hip getting out of the chair.  He was brought  to the emergency room for evaluation.  X-ray shows he had a superior  dislocated right total hip arthroplasty.  He was taken to the OR for a  closed reduction.   ALLERGIES:  PENICILLIN.   MEDICATIONS ON ADMISSION:  Duragesic patch, Xanax, Cymbalta, lithium,  ProAir inhaler, oxycodone, Abilify , and Robaxin.   SURGICAL PROCEDURE:  On April 30, 2007 the patient was taken to the OR  by Dr. Ranee Gosselin assisted by Jamelle Rushing, PA-C, under general  anesthesia, the patient underwent a closed reduction of his right total  hip arthroplasty without any complications.  The patient was transferred  to the recovery room and then to the orthopedic floor in a leg  immobilizer after C-arm confirmation that the total hip was reduced.   CONSULTANT:  Physical therapy consult.   HOSPITAL COURSE:  On April 30, 2007, the patient was admitted to  [facility] ER to Dr. Jeannetta Ellis service.  The patient had a dislocated  right total hip arthroplasty per x-rays.  The patient was taken to the  OR where a closed reduction was performed; confirmation with C-arm was  performed with that, and the patient was transferred to recovery room,  then to orthopedic floor with a reduced right total hip arthroplasty in  the leg immobilizer with IV pain medicines.  The patient then was  evaluated, the following day, and was felt to be neurologically intact;  and was released and discharged home with followup with Dr. Lequita Halt in 2  weeks from dislocation date.   LABS:  None.   DISCHARGE INSTRUCTIONS:  1. Diet no restrictions.  2. Wound nonapplicable.   ACTIVITY:  The patient may shower.  He is to keep his right lower  extremity in a leg immobilizer, use a walker for assistance.   FOLLOWUP:  The patient  is to followup with Dr. Lequita Halt in 2 weeks.  The  patient is to call for an appointment [phone].   MEDICATIONS:  1. Abilify 20 mg once a day.  2. Cymbalta 60 mg twice a day.  3. Fentanyl 100 mcg patch, 3patches every day, topical.  4. Lithium carbonate 300 mg 1 tablet twice a day.  5. Omeprazole 20 mg once a day.  6. Robaxin 500 mg three times a day.  7. Xanax 2 mg four tablets three times a days.  8. Prednisone dose pack as directed.  9. Oxycodone solution 20 mg q.2 h. p.r.n.   The patient's condition upon discharge to home is improved and good.      Jamelle Rushing, P.A.    ______________________________  Georges Lynch Darrelyn Hillock, M.D.    RWK/MEDQ  D:  07/01/2007  T:  07/01/2007  Job:  [id number]   cc:   Windy Fast A. Darrelyn Hillock, M.D.  Fax: [phone]

## 2011-03-15 LAB — CROSSMATCH
ABO/RH(D): O NEG
Antibody Screen: NEGATIVE

## 2011-03-15 LAB — CBC
HCT: 37.3 — ABNORMAL LOW
HCT: 38.1 — ABNORMAL LOW
Hemoglobin: 12.2 — ABNORMAL LOW
MCHC: 33.1
MCV: 85.2
MCV: 87.2
MCV: 87.3
Platelets: 220
Platelets: 287
Platelets: 301
Platelets: 305
RDW: 16 — ABNORMAL HIGH
RDW: 16.4 — ABNORMAL HIGH
RDW: 16.9 — ABNORMAL HIGH
WBC: 11.3 — ABNORMAL HIGH
WBC: 16.5 — ABNORMAL HIGH
WBC: 19.7 — ABNORMAL HIGH
WBC: 9.1

## 2011-03-15 LAB — BASIC METABOLIC PANEL
BUN: 2 — ABNORMAL LOW
BUN: 5 — ABNORMAL LOW
Chloride: 101
Creatinine, Ser: 0.79
Creatinine, Ser: 0.91
GFR calc non Af Amer: >60
Glucose, Bld: 127 — ABNORMAL HIGH
Glucose, Bld: 127 — ABNORMAL HIGH
Potassium: 4.1

## 2011-03-15 LAB — PROTIME-INR
INR: 1
Prothrombin Time: 13.6
Prothrombin Time: 14.3

## 2011-03-15 LAB — COMPREHENSIVE METABOLIC PANEL
AST: 14
Albumin: 2.5 — ABNORMAL LOW
Calcium: 8.8
Creatinine, Ser: 0.66
GFR calc Af Amer: >60
Sodium: 139
Total Protein: 5.8 — ABNORMAL LOW

## 2011-03-15 LAB — DIFFERENTIAL
Basophils Absolute: 0.1
Eosinophils Absolute: 0.3
Eosinophils Relative: 2
Lymphocytes Relative: 15
Neutrophils Relative %: 76

## 2011-03-18 LAB — PROTIME-INR
INR: 1.4
INR: 2.8 — ABNORMAL HIGH
Prothrombin Time: 17 — ABNORMAL HIGH
Prothrombin Time: 23.9 — ABNORMAL HIGH
Prothrombin Time: 30.1 — ABNORMAL HIGH

## 2011-03-18 LAB — BASIC METABOLIC PANEL
BUN: 4 — ABNORMAL LOW
CO2: 31
CO2: 33 — ABNORMAL HIGH
Calcium: 8.5
Calcium: 8.9
Chloride: 102
Creatinine, Ser: 0.6
Creatinine, Ser: 0.73
GFR calc Af Amer: >60
GFR calc non Af Amer: >60
Glucose, Bld: 119 — ABNORMAL HIGH
Sodium: 136
Sodium: 139

## 2011-03-18 LAB — CBC
Hemoglobin: 10 — ABNORMAL LOW
MCHC: 33.6
MCHC: 33.9
MCV: 87.6
Platelets: 246
Platelets: 272
RBC: 3.39 — ABNORMAL LOW
RDW: 15.8 — ABNORMAL HIGH
RDW: 16 — ABNORMAL HIGH
WBC: 9.2
WBC: 9.7

## 2011-03-18 LAB — CROSSMATCH
ABO/RH(D): O NEG
Antibody Screen: NEGATIVE

## 2011-03-21 LAB — DIFFERENTIAL
Basophils Absolute: 0
Basophils Relative: 0
Basophils Relative: 0
Eosinophils Absolute: 0
Eosinophils Absolute: 0.2
Eosinophils Absolute: 0.2
Eosinophils Absolute: 0.3
Eosinophils Relative: 1
Eosinophils Relative: 1
Lymphocytes Relative: 6 — ABNORMAL LOW
Lymphocytes Relative: 7 — ABNORMAL LOW
Lymphs Abs: 2.3
Monocytes Absolute: 0.9
Monocytes Absolute: 0.9
Monocytes Absolute: 0.9
Monocytes Relative: 4
Monocytes Relative: 5
Monocytes Relative: 5
Neutro Abs: 16.1 — ABNORMAL HIGH
Neutro Abs: 16.2 — ABNORMAL HIGH
Neutro Abs: 26.2 — ABNORMAL HIGH
Neutrophils Relative %: 82 — ABNORMAL HIGH
Neutrophils Relative %: 88 — ABNORMAL HIGH
Neutrophils Relative %: 91 — ABNORMAL HIGH

## 2011-03-21 LAB — HEPATIC FUNCTION PANEL
ALT: 13
AST: 23
Albumin: 2.8 — ABNORMAL LOW
Albumin: 3.5
Alkaline Phosphatase: 189 — ABNORMAL HIGH
Indirect Bilirubin: 1 — ABNORMAL HIGH
Total Bilirubin: 1.3 — ABNORMAL HIGH
Total Protein: 5.7 — ABNORMAL LOW
Total Protein: 6.7

## 2011-03-21 LAB — CULTURE, BLOOD (ROUTINE X 2)
Culture: NO GROWTH
Culture: NO GROWTH
Report Status: 7132009
Report Status: 7132009

## 2011-03-21 LAB — CBC
HCT: 38.2 — ABNORMAL LOW
Hemoglobin: 11 — ABNORMAL LOW
Hemoglobin: 11.8 — ABNORMAL LOW
Hemoglobin: 12.4 — ABNORMAL LOW
Hemoglobin: 9.6 — ABNORMAL LOW
MCHC: 32.2
MCHC: 32.5
MCHC: 32.6
MCHC: 32.6
MCV: 87.3
MCV: 88.7
Platelets: 268
RBC: 3.38 — ABNORMAL LOW
RBC: 3.85 — ABNORMAL LOW
RDW: 14.6
RDW: 15.3
WBC: 18.3 — ABNORMAL HIGH
WBC: 21 — ABNORMAL HIGH

## 2011-03-21 LAB — BASIC METABOLIC PANEL
BUN: 18
BUN: 6
CO2: 28
CO2: 28
Calcium: 9.1
Calcium: 9.3
Chloride: 100
Chloride: 89 — ABNORMAL LOW
GFR calc Af Amer: >60
GFR calc Af Amer: >60
GFR calc non Af Amer: >60
GFR calc non Af Amer: >60
Glucose, Bld: 102 — ABNORMAL HIGH
Potassium: 2.5 — CL
Sodium: 131 — ABNORMAL LOW
Sodium: 133 — ABNORMAL LOW
Sodium: 134 — ABNORMAL LOW

## 2011-03-21 LAB — COMPREHENSIVE METABOLIC PANEL
ALT: 11
Albumin: 3 — ABNORMAL LOW
Calcium: 9.3
Glucose, Bld: 84
Sodium: 136
Total Protein: 6.4

## 2011-03-21 LAB — APTT: aPTT: 35

## 2011-03-22 LAB — COMPREHENSIVE METABOLIC PANEL
AST: 26
Albumin: 2.8 — ABNORMAL LOW
Alkaline Phosphatase: 206 — ABNORMAL HIGH
BUN: 8
CO2: 39 — ABNORMAL HIGH
Chloride: 89 — ABNORMAL LOW
Creatinine, Ser: 0.95
GFR calc non Af Amer: >60
Potassium: 2.3 — CL
Total Bilirubin: 0.6

## 2011-03-22 LAB — HCV RNA QUANT

## 2011-03-22 LAB — URINALYSIS, ROUTINE W REFLEX MICROSCOPIC
Glucose, UA: NEGATIVE
Hgb urine dipstick: NEGATIVE
Specific Gravity, Urine: 1.01
pH: 6.5

## 2011-03-22 LAB — CBC
HCT: 33.8 — ABNORMAL LOW
MCV: 86.9
RBC: 3.89 — ABNORMAL LOW
WBC: 15.9 — ABNORMAL HIGH

## 2011-03-22 LAB — LIPASE, BLOOD: Lipase: 29

## 2011-03-22 LAB — DIFFERENTIAL
Basophils Absolute: 0.1
Basophils Relative: 0
Eosinophils Relative: 3
Lymphocytes Relative: 20
Monocytes Absolute: 0.8
Neutro Abs: 11.4 — ABNORMAL HIGH

## 2011-03-22 LAB — BASIC METABOLIC PANEL
CO2: 34 — ABNORMAL HIGH
Chloride: 104
GFR calc Af Amer: >60
Potassium: 3.1 — ABNORMAL LOW

## 2011-03-22 LAB — HEPATITIS B SURFACE ANTIGEN: Hepatitis B Surface Ag: NEGATIVE

## 2011-03-22 LAB — TSH: TSH: 37.953 — ABNORMAL HIGH

## 2011-03-22 LAB — HEPATITIS PANEL, ACUTE: Hep B C IgM: NEGATIVE

## 2011-03-22 LAB — HEPATITIS C ANTIBODY: HCV Ab: REACTIVE — AB

## 2011-03-25 LAB — STOOL CULTURE

## 2011-03-25 LAB — POCT I-STAT 4, (NA,K, GLUC, HGB,HCT)
Glucose, Bld: 95
HCT: 41
Hemoglobin: 13.9
Potassium: 2.8 — ABNORMAL LOW
Potassium: 3.7
Sodium: 139

## 2011-03-25 LAB — OVA AND PARASITE EXAMINATION

## 2011-03-25 LAB — CLOSTRIDIUM DIFFICILE EIA

## 2011-03-25 LAB — FECAL LACTOFERRIN, QUANT

## 2011-04-01 LAB — COMPREHENSIVE METABOLIC PANEL
Alkaline Phosphatase: 146 — ABNORMAL HIGH
BUN: 8
Chloride: 96
GFR calc non Af Amer: >60
Glucose, Bld: 105 — ABNORMAL HIGH
Potassium: 4.3
Total Bilirubin: 0.6

## 2011-04-01 LAB — DIFFERENTIAL
Basophils Absolute: 0
Basophils Relative: 0
Neutro Abs: 11.3 — ABNORMAL HIGH
Neutrophils Relative %: 66

## 2011-04-01 LAB — BLOOD GAS, ARTERIAL
Bicarbonate: 32.6 — ABNORMAL HIGH
Patient temperature: 98.6
TCO2: 29.8
pH, Arterial: 7.38

## 2011-04-01 LAB — CBC
HCT: 36.5 — ABNORMAL LOW
Hemoglobin: 11.8 — ABNORMAL LOW
RDW: 15.4
WBC: 17 — ABNORMAL HIGH

## 2011-04-03 LAB — DIFFERENTIAL
Basophils Relative: 0
Basophils Relative: 1
Eosinophils Absolute: 0.6
Eosinophils Absolute: 0.4
Eosinophils Absolute: 0.5
Eosinophils Relative: 4
Eosinophils Relative: 3
Lymphocytes Relative: 20
Lymphs Abs: 2.4
Lymphs Abs: 3.5 — ABNORMAL HIGH
Monocytes Absolute: 0.9 — ABNORMAL HIGH
Monocytes Absolute: 1.1 — ABNORMAL HIGH
Monocytes Relative: 6
Monocytes Relative: 6
Neutro Abs: 10.5 — ABNORMAL HIGH
Neutro Abs: 13.1 — ABNORMAL HIGH
Neutrophils Relative %: 67
Neutrophils Relative %: 69
Neutrophils Relative %: 77

## 2011-04-03 LAB — CBC
HCT: 31 — ABNORMAL LOW
HCT: 27.6 — ABNORMAL LOW
HCT: 29.5 — ABNORMAL LOW
HCT: 31.9 — ABNORMAL LOW
HCT: 32 — ABNORMAL LOW
HCT: 32.4 — ABNORMAL LOW
Hemoglobin: 9 — ABNORMAL LOW
Hemoglobin: 9.6 — ABNORMAL LOW
MCHC: 32.7
MCHC: 32.9
MCHC: 32.9
MCV: 89.3
MCV: 88
MCV: 88.1
MCV: 89
Platelets: 684 — ABNORMAL HIGH
Platelets: 570 — ABNORMAL HIGH
Platelets: 634 — ABNORMAL HIGH
Platelets: 685 — ABNORMAL HIGH
Platelets: 688 — ABNORMAL HIGH
Platelets: 710 — ABNORMAL HIGH
Platelets: 735 — ABNORMAL HIGH
RBC: 3.02 — ABNORMAL LOW
RBC: 3.14 — ABNORMAL LOW
RBC: 3.6 — ABNORMAL LOW
RBC: 3.65 — ABNORMAL LOW
RDW: 15.2 — ABNORMAL HIGH
RDW: 14.3 — ABNORMAL HIGH
RDW: 14.8 — ABNORMAL HIGH
RDW: 14.9 — ABNORMAL HIGH
WBC: 15.4 — ABNORMAL HIGH
WBC: 13.2 — ABNORMAL HIGH
WBC: 13.7 — ABNORMAL HIGH
WBC: 14.5 — ABNORMAL HIGH
WBC: 15.5 — ABNORMAL HIGH
WBC: 21.1 — ABNORMAL HIGH

## 2011-04-03 LAB — POCT I-STAT CREATININE
Creatinine, Ser: 0.9
Operator id: 267321

## 2011-04-03 LAB — I-STAT 8, (EC8 V) (CONVERTED LAB)
Acid-Base Excess: 7 — ABNORMAL HIGH
TCO2: 34
pCO2, Ven: 51.3 — ABNORMAL HIGH
pH, Ven: 7.414 — ABNORMAL HIGH

## 2011-04-03 LAB — APTT
aPTT: 29
aPTT: 71 — ABNORMAL HIGH

## 2011-04-03 LAB — BASIC METABOLIC PANEL
BUN: 3 — ABNORMAL LOW
Calcium: 9
Chloride: 102
Creatinine, Ser: 0.69
GFR calc Af Amer: >60
GFR calc Af Amer: >60
GFR calc non Af Amer: >60
GFR calc non Af Amer: >60
Glucose, Bld: 174 — ABNORMAL HIGH
Potassium: 3.8
Sodium: 136

## 2011-04-03 LAB — PROTIME-INR
INR: 1
INR: 1.2
INR: 1.6 — ABNORMAL HIGH
INR: 2.1 — ABNORMAL HIGH
INR: 2.2 — ABNORMAL HIGH
Prothrombin Time: 15.7 — ABNORMAL HIGH
Prothrombin Time: 19.2 — ABNORMAL HIGH

## 2011-04-03 LAB — HEPATIC FUNCTION PANEL
Alkaline Phosphatase: 111
Bilirubin, Direct: 0.1
Indirect Bilirubin: 0.3
Total Bilirubin: 0.4
Total Protein: 6.7

## 2011-04-03 LAB — COMPREHENSIVE METABOLIC PANEL
Albumin: 2.7 — ABNORMAL LOW
BUN: 3 — ABNORMAL LOW
Calcium: 9.7
Creatinine, Ser: 0.71
Glucose, Bld: 84
Total Protein: 6.8

## 2011-04-03 LAB — WOUND CULTURE: Culture: NO GROWTH

## 2011-04-03 LAB — LIPASE, BLOOD: Lipase: 17

## 2011-04-03 LAB — ANAEROBIC CULTURE

## 2011-04-04 LAB — CROSSMATCH
ABO/RH(D): O NEG
Antibody Screen: NEGATIVE

## 2011-04-04 LAB — CBC
HCT: 38.3 — ABNORMAL LOW
Hemoglobin: 10.4 — ABNORMAL LOW
Hemoglobin: 12.3 — ABNORMAL LOW
MCHC: 33.6
MCHC: 32
Platelets: 274
Platelets: 295
RBC: 3.59 — ABNORMAL LOW
RDW: 13.7
RDW: 13.8
RDW: 13
WBC: 14.8 — ABNORMAL HIGH
WBC: 15.6 — ABNORMAL HIGH
WBC: 18.8 — ABNORMAL HIGH

## 2011-04-04 LAB — URINALYSIS, ROUTINE W REFLEX MICROSCOPIC
Bilirubin Urine: NEGATIVE
Glucose, UA: NEGATIVE
Ketones, ur: NEGATIVE
pH: 7

## 2011-04-04 LAB — PROTIME-INR
INR: 1.1
INR: 1.6 — ABNORMAL HIGH
INR: 2.2 — ABNORMAL HIGH
INR: 2.4 — ABNORMAL HIGH
INR: 2.7 — ABNORMAL HIGH
Prothrombin Time: 13.9
Prothrombin Time: 19 — ABNORMAL HIGH
Prothrombin Time: 29.8 — ABNORMAL HIGH

## 2011-04-04 LAB — BASIC METABOLIC PANEL
BUN: 3 — ABNORMAL LOW
CO2: 28
Calcium: 8.3 — ABNORMAL LOW
Calcium: 8.7
Creatinine, Ser: 0.83
Creatinine, Ser: 0.86
GFR calc Af Amer: >60
GFR calc non Af Amer: >60
GFR calc non Af Amer: >60
Glucose, Bld: 113 — ABNORMAL HIGH
Sodium: 133 — ABNORMAL LOW

## 2011-04-04 LAB — APTT: aPTT: 34

## 2011-04-04 LAB — COMPREHENSIVE METABOLIC PANEL
ALT: 11
AST: 15
Albumin: 3.2 — ABNORMAL LOW
Alkaline Phosphatase: 107
CO2: 34 — ABNORMAL HIGH
Chloride: 99
GFR calc non Af Amer: >60
Potassium: 4.2
Sodium: 137
Total Bilirubin: 0.8

## 2011-04-04 LAB — ABO/RH: ABO/RH(D): O NEG

## 2011-05-10 ENCOUNTER — Encounter (HOSPITAL_COMMUNITY): Payer: Self-pay | Admitting: Pharmacy Technician

## 2011-05-20 ENCOUNTER — Encounter (HOSPITAL_COMMUNITY): Payer: Self-pay

## 2011-05-20 ENCOUNTER — Encounter (HOSPITAL_COMMUNITY)
Admission: RE | Admit: 2011-05-20 | Discharge: 2011-05-20 | Disposition: A | Discharge status: Home / Self Care | Payer: Medicaid Other | Source: Ambulatory Visit | Attending: Urology | Admitting: Urology

## 2011-05-20 ENCOUNTER — Other Ambulatory Visit: Payer: Self-pay

## 2011-05-20 HISTORY — DX: Hypothyroidism, unspecified: E03.9

## 2011-05-20 HISTORY — DX: Unspecified cirrhosis of liver: K74.60

## 2011-05-20 HISTORY — DX: Major depressive disorder, single episode, unspecified: F32.9

## 2011-05-20 HISTORY — DX: Chronic obstructive pulmonary disease, unspecified: J44.9

## 2011-05-20 HISTORY — DX: Reserved for concepts with insufficient information to code with codable children: IMO0002

## 2011-05-20 HISTORY — DX: Depression, unspecified: F32.A

## 2011-05-20 HISTORY — DX: Benign prostatic hyperplasia without lower urinary tract symptoms: N40.0

## 2011-05-20 HISTORY — DX: Other chronic pancreatitis: K86.1

## 2011-05-20 HISTORY — DX: Anxiety disorder, unspecified: F41.9

## 2011-05-20 LAB — BASIC METABOLIC PANEL
BUN: 6 mg/dL (ref 6–23)
CO2: 30 mEq/L (ref 19–32)
Chloride: 97 mEq/L (ref 96–112)
GFR calc non Af Amer: 83 mL/min — ABNORMAL LOW (ref >90)
Glucose, Bld: 122 mg/dL — ABNORMAL HIGH (ref 70–99)
Potassium: 4.7 mEq/L (ref 3.5–5.1)
Sodium: 134 mEq/L — ABNORMAL LOW (ref 135–145)

## 2011-05-20 LAB — DIFFERENTIAL
Eosinophils Absolute: 0.5 10*3/uL (ref 0.0–0.7)
Lymphocytes Relative: 31 % (ref 12–46)
Lymphs Abs: 3.8 10*3/uL (ref 0.7–4.0)
Monocytes Relative: 7 % (ref 3–12)
Neutrophils Relative %: 58 % (ref 43–77)

## 2011-05-20 LAB — CBC
Hemoglobin: 13.1 g/dL (ref 13.0–17.0)
MCH: 29 pg (ref 26.0–34.0)
RBC: 4.52 MIL/uL (ref 4.22–5.81)
WBC: 12.5 10*3/uL — ABNORMAL HIGH (ref 4.0–10.5)

## 2011-05-20 LAB — SURGICAL PCR SCREEN: MRSA, PCR: NEGATIVE

## 2011-05-20 NOTE — Patient Instructions (Addendum)
Transurethral Resection of the Prostate Transurethral Resection of the Prostate (TURP) is often treatment for non-cancerous (benign) prostatic hyperplasia (BPH) or prostate cancer. BPH commonly begins in middle aged men and can cause symptoms at any age thereafter. The complications that stem from these problems, such as recurrent infection and bladder control and emptying problems, are often helped by this procedure. Both conditions usually cause the prostate to increase in size. TURP is a major surgery that removes part of the prostate gland. The goal is to remove enough prostate to allow for unobstructed flow of urine. TREATMENT This surgery (TURP procedure) is done using an instrument like a narrow telescope to look into your bladder. This is then used to remove enlarged pieces of your prostate, one piece at a time. This removes the blockage and makes it easier for you to urinate. This is called a transurethral resection of the prostate. A lesser procedure is sometimes done. In this procedure, small cuts are made in the prostate. This lessens the prostates pressure on the urethra. This is called a transurethral incision (cut by the surgeon) of the prostate (TUIP). You will probably be comfortable soon after the operation, but it will take 10 to 12 weeks, or longer, for your prostate to heal after you leave the hospital.  LET YOUR CAREGIVER KNOW ABOUT:  Allergies.   Medications taken including herbs, eye drops, over the counter medications, and creams.   Use of steroids (by mouth or creams).   Previous problems with anesthetics or Novocaine.   History of blood clots (thrombophlebitis).   History of bleeding or blood problems.   Previous surgery.   Previous prostate infections.   Other health problems.  RISKS AND COMPLICATIONS  Rare injury to the bowel (intestine).   Rare injury to the bladder or adjacent blood vessels.   Intestinal/bowel obstruction.   Scarring called "stricture"  that cause later problems with the flow of urine.   Bleeding and the need for blood transfusion (more common in those with prostate cancer and those who have previously received radiation therapy).   Inability to control your urine (incontinence). This is more common in those with prostate cancer and those who have received radiation therapy.   Injury to one of the ureters (tubes that drain the kidneys into the bladder) and/or urethra (the tube that drains the bladder).   Injury to the capsule that holds the prostate. This can lead to leakage of fluid and urine into the belly (abdomen).   The operation can possibly lead to impotence. This is the inability to get an erection. Treatments are available for these types of problems.  Rare over absorption of the fluids used during the operation. This can then cause low blood levels of20 [name]  05/20/2011   Your procedure is scheduled on:  05/23/2011  Report to [facility] at  730  AM.  Call this number if you have problems the morning of surgery: [phone]   Remember:   Do not eat food:After Midnight.  May have clear liquids:until Midnight .  Clear liquids include soda, tea, black coffee, apple or grape juice, broth.  Take these medicines the morning of surgery with A SIP OF WATER:  Cymbalta,wellbutrin,valium,synthroid,phenergan   Do not wear jewelry, make-up or nail polish.  Do not wear lotions, powders, or perfumes. You may wear deodorant.  Do not shave 48 hours prior to surgery.  Do not bring valuables to the hospital.  Contacts, dentures or bridgework may not be worn into surgery.  Leave suitcase in the car. After surgery it may be brought to your room.  For patients admitted to the hospital, checkout time is 11:00 AM the day of discharge.   Patients discharged the day of surgery will not be allowed to drive home.  Name and phone number of your driver: family  Special Instructions: CHG Shower Use Special Wash: 1/2 bottle  night before surgery and 1/2 bottle morning of surgery.    Please read over the following fact sheets that you were given: Pain Booklet, MRSA Information, Surgical Site Infection Prevention, Anesthesia Post-op Instructions and Care and Recovery After Surgery  sodium, brain swelling, strain on the heart, and fluid accumulation in the lungs. This is more common in long procedures.   Infection.   Blood clots in the legs.   As with any major surgery, there is always the rare chance of a complicating stroke, heart attack, or other complications that will be discussed with you by the surgeon and anesthesiologist.  BEFORE THE PROCEDURE   You may be asked to temporarily adjust your diet. If so, your caregiver will give you specific recommendations.   If you are on blood thinners, stop taking them before the operation, or as your caregiver advises.   You should have nothing to eat or drink after midnight prior to your surgery or as suggested by your caregiver. You may have a sip of water to take medications not stopped for the procedure  PROCEDURE  This operation is performed after you have been given a medication to help you sleep (anesthetic), or with a spinal block. The spinal block keeps you awake but numb from the waist down.  No cut or incision is needed. During the operation, your surgeon passes a viewing and cutting instrument (resectoscope) through the penis into the prostate gland. The instrument contains an electric cutting edge. From inside the prostate, this cutter is used to remove part of the prostate.  HOME CARE INSTRUCTIONS  For your own protection, observe the following precautions for 10 days after your operation.  You may go home with a catheter. Take care of it as directed. You will receive instruction on catheter care.   After catheter removal, empty the bladder whenever you feel a definite desire. Do not try to hold the urine for long periods of time.   For 10 days, avoid all  lifting, straining, running, strenuous work, walks longer than a couple blocks, riding in a car for extended periods, and sexual relations.   Take 2 tablespoons of heavy mineral oil or Metamucil night and morning for 3 or 4 days. After that, gradually reduce the dose to one or two teaspoons twice daily. Stop it after the stools have been normal for a week. If you become constipated, do not strain to move your bowels. You may use an enema. Notify your caregiver about problems.   Even after complete healing, you may continue to urinate once or twice during the night.   In addition to your usual medications, you may be given an antibiotic to take for 10-14 days. Notify your caregiver if you have any side effects or problems with the medication.   Avoid alcohol and caffeinated drinks for 2 weeks, as they are irritating to the bladder. Decaffeinated drinks are fine.   Eat a regular diet, avoiding spicy foods for 2 weeks.   You may continue non-strenuous activities. It is always important to keep active after an operation. This lessens the chance of developing blood clots.  You may see some recurrence of blood in the urine after discharge from the hospital. Even a small amount of blood colors the urine very red. If this occurs, force fluids again as you did in the hospital. This is generally not a concern.  SEEK MEDICAL CARE IF:   You have chills or night sweats.   You are leaking around your catheter or have problems with your catheter.   You develop side effects that you think are coming from your medications.  SEEK IMMEDIATE MEDICAL CARE IF:   You are suddenly unable to urinate. This is an emergency.   You develop shortness of breath or chest pains.   Bleeding persists or clots develop.   You have a fever.   You develop pain in your back or over your lower belly (abdomen).   You develop pain or swelling in your legs.   You develop swelling in your abdomen or have a sudden weight  gain.   The problems get worse rather than better.  Document Released: 06/10/2005 Document Revised: 02/20/2011 Document Reviewed: 12/27/2008 [facility] Patient Information 2012 [place], [place].PATIENT INSTRUCTIONS POST-ANESTHESIA  IMMEDIATELY FOLLOWING SURGERY:  Do not drive or operate machinery for the first twenty four hours after surgery.  Do not make any important decisions for twenty four hours after surgery or while taking narcotic pain medications or sedatives.  If you develop intractable nausea and vomiting or a severe headache please notify your doctor immediately.  FOLLOW-UP:  Please make an appointment with your surgeon as instructed. You do not need to follow up with anesthesia unless specifically instructed to do so.  WOUND CARE INSTRUCTIONS (if applicable):  Keep a dry clean dressing on the anesthesia/puncture wound site if there is drainage.  Once the wound has quit draining you may leave it open to air.  Generally you should leave the bandage intact for twenty four hours unless there is drainage.  If the epidural site drains for more than 36-48 hours please call the anesthesia department.  QUESTIONS?:  Please feel free to call your physician or the hospital operator if you have any questions, and they will be happy to assist you.     [facility] Anesthesia Department [address] [place] [phone]

## 2011-05-23 ENCOUNTER — Ambulatory Visit (HOSPITAL_COMMUNITY): Payer: Medicaid Other | Admitting: Anesthesiology

## 2011-05-23 ENCOUNTER — Other Ambulatory Visit: Payer: Self-pay | Admitting: Urology

## 2011-05-23 ENCOUNTER — Ambulatory Visit (HOSPITAL_COMMUNITY)
Admission: RE | Admit: 2011-05-23 | Discharge: 2011-05-24 | Disposition: A | Discharge status: Home / Self Care | Payer: Medicaid Other | Source: Ambulatory Visit | Attending: Urology | Admitting: Urology

## 2011-05-23 ENCOUNTER — Encounter (HOSPITAL_COMMUNITY): Payer: Self-pay | Admitting: *Deleted

## 2011-05-23 ENCOUNTER — Encounter (HOSPITAL_COMMUNITY): Payer: Self-pay | Admitting: Anesthesiology

## 2011-05-23 ENCOUNTER — Encounter (HOSPITAL_COMMUNITY)
Admission: RE | Disposition: A | Discharge status: Home / Self Care | Payer: Self-pay | Source: Ambulatory Visit | Attending: Urology

## 2011-05-23 DIAGNOSIS — N138 Other obstructive and reflux uropathy: Secondary | ICD-10-CM | POA: Insufficient documentation

## 2011-05-23 DIAGNOSIS — F3289 Other specified depressive episodes: Secondary | ICD-10-CM | POA: Insufficient documentation

## 2011-05-23 DIAGNOSIS — E039 Hypothyroidism, unspecified: Secondary | ICD-10-CM | POA: Insufficient documentation

## 2011-05-23 DIAGNOSIS — F329 Major depressive disorder, single episode, unspecified: Secondary | ICD-10-CM | POA: Insufficient documentation

## 2011-05-23 DIAGNOSIS — N401 Enlarged prostate with lower urinary tract symptoms: Secondary | ICD-10-CM | POA: Insufficient documentation

## 2011-05-23 DIAGNOSIS — J4489 Other specified chronic obstructive pulmonary disease: Secondary | ICD-10-CM | POA: Insufficient documentation

## 2011-05-23 DIAGNOSIS — F411 Generalized anxiety disorder: Secondary | ICD-10-CM | POA: Insufficient documentation

## 2011-05-23 DIAGNOSIS — J449 Chronic obstructive pulmonary disease, unspecified: Secondary | ICD-10-CM | POA: Insufficient documentation

## 2011-05-23 HISTORY — PX: TRANSURETHRAL RESECTION OF PROSTATE: SHX73

## 2011-05-23 SURGERY — TURP (TRANSURETHRAL RESECTION OF PROSTATE)
Anesthesia: Spinal | Wound class: Clean Contaminated

## 2011-05-23 MED ORDER — CIPROFLOXACIN IN D5W 200 MG/100ML IV SOLN: 200.0000 mg | Freq: Once | INTRAVENOUS | Status: DC

## 2011-05-23 MED ORDER — PROPOFOL 10 MG/ML IV EMUL
INTRAVENOUS | Status: AC
  Filled 2011-05-23: qty 20

## 2011-05-23 MED ORDER — CIPROFLOXACIN IN D5W 400 MG/200ML IV SOLN
INTRAVENOUS | Status: DC | PRN
  Administered 2011-05-23: 200 mg via INTRAVENOUS

## 2011-05-23 MED ORDER — FENTANYL CITRATE 0.05 MG/ML IJ SOLN
INTRAMUSCULAR | Status: DC | PRN
  Administered 2011-05-23: 25 ug via INTRATHECAL

## 2011-05-23 MED ORDER — ONDANSETRON HCL 4 MG PO TABS: 4.0000 mg | ORAL_TABLET | Freq: Four times a day (QID) | ORAL | Status: DC | PRN

## 2011-05-23 MED ORDER — DIAZEPAM 5 MG PO TABS
10.0000 mg | ORAL_TABLET | Freq: Three times a day (TID) | ORAL | Status: DC
  Administered 2011-05-23 – 2011-05-24 (×3): 10 mg via ORAL
  Filled 2011-05-23 (×3): qty 2

## 2011-05-23 MED ORDER — MORPHINE SULFATE 4 MG/ML IJ SOLN
1.0000 mg | INTRAMUSCULAR | Status: DC | PRN
  Administered 2011-05-23 – 2011-05-24 (×5): 1 mg via INTRAVENOUS
  Filled 2011-05-23 (×5): qty 1

## 2011-05-23 MED ORDER — STERILE WATER FOR IRRIGATION IR SOLN
Status: DC | PRN
  Administered 2011-05-23: 1000 mL

## 2011-05-23 MED ORDER — MIDAZOLAM HCL 2 MG/2ML IJ SOLN
1.0000 mg | INTRAMUSCULAR | Status: DC | PRN
  Administered 2011-05-23: 2 mg via INTRAVENOUS

## 2011-05-23 MED ORDER — BUPROPION HCL ER (XL) 150 MG PO TB24
150.0000 mg | ORAL_TABLET | ORAL | Status: DC
  Administered 2011-05-24: 150 mg via ORAL
  Filled 2011-05-23 (×4): qty 1

## 2011-05-23 MED ORDER — SODIUM CHLORIDE 0.9 % IR SOLN
Status: DC | PRN
  Administered 2011-05-23: 3000 mL

## 2011-05-23 MED ORDER — SODIUM CHLORIDE 0.9 % IJ SOLN
INTRAMUSCULAR | Status: DC | PRN
  Administered 2011-05-23: 10 mL via INTRAVENOUS

## 2011-05-23 MED ORDER — LACTATED RINGERS IV SOLN
INTRAVENOUS | Status: DC
  Administered 2011-05-23: 10:00:00 via INTRAVENOUS

## 2011-05-23 MED ORDER — LEVOTHYROXINE SODIUM 75 MCG PO TABS
75.0000 ug | ORAL_TABLET | Freq: Every day | ORAL | Status: DC
  Administered 2011-05-24: 75 ug via ORAL
  Filled 2011-05-23: qty 1

## 2011-05-23 MED ORDER — BUPIVACAINE IN DEXTROSE 0.75-8.25 % IT SOLN
INTRATHECAL | Status: DC | PRN
  Administered 2011-05-23: 15 mg via INTRATHECAL

## 2011-05-23 MED ORDER — LITHIUM CARBONATE 300 MG PO CAPS
300.0000 mg | ORAL_CAPSULE | Freq: Three times a day (TID) | ORAL | Status: DC
  Administered 2011-05-23 – 2011-05-24 (×3): 300 mg via ORAL
  Filled 2011-05-23 (×12): qty 1

## 2011-05-23 MED ORDER — PROPOFOL 10 MG/ML IV EMUL
INTRAVENOUS | Status: DC | PRN
  Administered 2011-05-23: 75 ug/kg/min via INTRAVENOUS

## 2011-05-23 MED ORDER — MIDAZOLAM HCL 2 MG/2ML IJ SOLN
INTRAMUSCULAR | Status: AC
  Filled 2011-05-23: qty 2

## 2011-05-23 MED ORDER — CIPROFLOXACIN IN D5W 200 MG/100ML IV SOLN
INTRAVENOUS | Status: AC
  Filled 2011-05-23: qty 100

## 2011-05-23 MED ORDER — ONDANSETRON HCL 4 MG/2ML IJ SOLN: 4.0000 mg | Freq: Four times a day (QID) | INTRAMUSCULAR | Status: DC | PRN

## 2011-05-23 MED ORDER — DULOXETINE HCL 60 MG PO CPEP
60.0000 mg | ORAL_CAPSULE | Freq: Two times a day (BID) | ORAL | Status: DC
Start: 2011-05-23 — End: 2011-05-24
  Administered 2011-05-23 – 2011-05-24 (×2): 60 mg via ORAL
  Filled 2011-05-23 (×2): qty 1

## 2011-05-23 MED ORDER — EPHEDRINE SULFATE 50 MG/ML IJ SOLN
INTRAMUSCULAR | Status: AC
  Filled 2011-05-23: qty 1

## 2011-05-23 MED ORDER — BUPIVACAINE IN DEXTROSE 0.75-8.25 % IT SOLN
INTRATHECAL | Status: AC
  Filled 2011-05-23: qty 2

## 2011-05-23 MED ORDER — MIDAZOLAM HCL 5 MG/5ML IJ SOLN
INTRAMUSCULAR | Status: DC | PRN
  Administered 2011-05-23: 2 mg via INTRAVENOUS

## 2011-05-23 MED ORDER — FENTANYL CITRATE 0.05 MG/ML IJ SOLN: 25.0000 ug | INTRAMUSCULAR | Status: DC | PRN

## 2011-05-23 MED ORDER — FENTANYL CITRATE 0.05 MG/ML IJ SOLN
INTRAMUSCULAR | Status: AC
  Filled 2011-05-23: qty 2

## 2011-05-23 MED ORDER — ONDANSETRON HCL 4 MG/2ML IJ SOLN: 4.0000 mg | Freq: Once | INTRAMUSCULAR | Status: DC | PRN

## 2011-05-23 MED ORDER — FENTANYL CITRATE 0.05 MG/ML IJ SOLN
INTRAMUSCULAR | Status: DC | PRN
  Administered 2011-05-23: 50 ug via INTRAVENOUS
  Administered 2011-05-23: 25 ug via INTRAVENOUS

## 2011-05-23 MED ORDER — DEXTROSE-NACL 5-0.45 % IV SOLN
INTRAVENOUS | Status: DC
  Administered 2011-05-23 (×2): via INTRAVENOUS

## 2011-05-23 MED ORDER — GLYCINE 1.5 % IR SOLN
Status: DC | PRN
  Administered 2011-05-23: 3000 mL

## 2011-05-23 SURGICAL SUPPLY — 48 items
BAG DECANTER FOR FLEXI CONT (MISCELLANEOUS) ×2 IMPLANT
BAG DRAIN URO TABLE W/ADPT NS (DRAPE) ×2 IMPLANT
BAG DRN 8 ADPR NS SKTRN CSTL (DRAPE) ×1
BAG DRN URN TUBE DRIP CHMBR (OSTOMY) ×1
BAG URINE DRAIN TURP 4L (OSTOMY) ×2 IMPLANT
BLADE SURG SZ11 CARB STEEL (BLADE) ×1 IMPLANT
CABLE HI FREQUENCY MONOPOLAR (ELECTROSURGICAL) ×2 IMPLANT
CATH FOLEY 3WAY 30CC 22F (CATHETERS) ×2 IMPLANT
CATH STAMEY (CATHETERS) ×1 IMPLANT
CLOTH BEACON ORANGE TIMEOUT ST (SAFETY) ×2 IMPLANT
CONNECTOR 5 IN 1 STRAIGHT STRL (MISCELLANEOUS) ×2 IMPLANT
DRAPE STERI URO 23X35 APER SZ5 (DRAPE) ×2 IMPLANT
ELECT CUT LOOP C-MAX 27FR .012 (CUTTING LOOP)
ELECT REM PT RETURN 9FT ADLT (ELECTROSURGICAL) ×2
ELECTRODE CUT LP CMX 27FR .012 (CUTTING LOOP) IMPLANT
ELECTRODE REM PT RTRN 9FT ADLT (ELECTROSURGICAL) ×1 IMPLANT
FLOOR PAD 36X40 (MISCELLANEOUS)
FORMALIN 10 PREFIL 480ML (MISCELLANEOUS) ×2 IMPLANT
GLOVE BIO SURGEON STRL SZ7 (GLOVE) ×2 IMPLANT
GLOVE BIOGEL PI IND STRL 7.0 (GLOVE) IMPLANT
GLOVE BIOGEL PI INDICATOR 7.0 (GLOVE) ×2
GLOVE ECLIPSE 6.5 STRL STRAW (GLOVE) ×1 IMPLANT
GLOVE EXAM NITRILE MD LF STRL (GLOVE) ×1 IMPLANT
GLYCINE 1.5% IRRIG UROMATIC (IV SOLUTION) ×10 IMPLANT
GOWN STRL REIN XL XLG (GOWN DISPOSABLE) ×4 IMPLANT
IV NS IRRIG 3000ML ARTHROMATIC (IV SOLUTION) ×2 IMPLANT
KIT BLADEGUARD II DBL (SET/KITS/TRAYS/PACK) ×1 IMPLANT
KIT ROOM TURNOVER AP CYSTO (KITS) ×2 IMPLANT
LEGGING LITHOTOMY PAIR STRL (DRAPES) ×1 IMPLANT
MANIFOLD NEPTUNE II (INSTRUMENTS) ×2 IMPLANT
NDL HYPO 18GX1.5 BLUNT FILL (NEEDLE) IMPLANT
NDL HYPO 25X1 1.5 SAFETY (NEEDLE) IMPLANT
NDL SPNL 18GX3.5 QUINCKE PK (NEEDLE) IMPLANT
NEEDLE HYPO 18GX1.5 BLUNT FILL (NEEDLE) ×2 IMPLANT
NEEDLE HYPO 25X1 1.5 SAFETY (NEEDLE) ×2 IMPLANT
NEEDLE SPNL 18GX3.5 QUINCKE PK (NEEDLE) ×2 IMPLANT
PACK CYSTO (CUSTOM PROCEDURE TRAY) ×2 IMPLANT
PAD ARMBOARD 7.5X6 YLW CONV (MISCELLANEOUS) ×2 IMPLANT
PAD FLOOR 36X40 (MISCELLANEOUS) ×1 IMPLANT
SET IRRIGATING DISP (SET/KITS/TRAYS/PACK) ×2 IMPLANT
SPONGE DRAIN TRACH 4X4 STRL 2S (GAUZE/BANDAGES/DRESSINGS) ×1 IMPLANT
SYR 30ML LL (SYRINGE) ×2 IMPLANT
SYR CONTROL 10ML LL (SYRINGE) ×1 IMPLANT
TAPE CLOTH SURG 4X10 WHT LF (GAUZE/BANDAGES/DRESSINGS) ×1 IMPLANT
TOWEL OR 17X26 4PK STRL BLUE (TOWEL DISPOSABLE) ×2 IMPLANT
WATER STERILE IRR 1000ML POUR (IV SOLUTION) ×2 IMPLANT
XPEEDA 550 SIDEFIRING FIBER (MISCELLANEOUS) IMPLANT
YANKAUER SUCT BULB TIP 10FT TU (MISCELLANEOUS) ×2 IMPLANT

## 2011-05-23 NOTE — H&P (Signed)
[phone]

## 2011-05-23 NOTE — Anesthesia Preprocedure Evaluation (Addendum)
Anesthesia Evaluation  Patient identified by MRN, date of birth, ID band Patient awake    Reviewed: Allergy & Precautions, H&P , NPO status , Patient's Chart, lab work & pertinent test results  Airway Mallampati: III      Dental  (+) Edentulous Upper and Edentulous Lower   Pulmonary COPDCurrent Smoker,    Pulmonary exam normal       Cardiovascular Regular Normal    Neuro/Psych PSYCHIATRIC DISORDERS Anxiety Depression    GI/Hepatic GERD-  ,(+) Cirrhosis - (panreatitis)       ,   Endo/Other  Hypothyroidism   Renal/GU      Musculoskeletal   Abdominal   Peds  Hematology   Anesthesia Other Findings   Reproductive/Obstetrics                           Anesthesia Physical Anesthesia Plan  ASA: III  Anesthesia Plan: Spinal   Post-op Pain Management:    Induction:   Airway Management Planned: Nasal Cannula  Additional Equipment:   Intra-op Plan:   Post-operative Plan:   Informed Consent: I have reviewed the patients History and Physical, chart, labs and discussed the procedure including the risks, benefits and alternatives for the proposed anesthesia with the patient or authorized representative who has indicated his/her understanding and acceptance.     Plan Discussed with:   Anesthesia Plan Comments:         Anesthesia Quick Evaluation

## 2011-05-23 NOTE — Anesthesia Postprocedure Evaluation (Signed)
  Anesthesia Post-op Note  Patient: [name]  Procedure(s) Performed:  TRANSURETHRAL RESECTION OF THE PROSTATE (TURP) - placement of suprapubic catheter  Patient Location: PACU  Anesthesia Type: Spinal  Level of Consciousness: awake, alert , oriented and patient cooperative  Airway and Oxygen Therapy: Patient Spontanous Breathing  Post-op Pain: none  Post-op Assessment: Post-op Vital signs reviewed, Patient's Cardiovascular Status Stable, Respiratory Function Stable, Patent Airway and No signs of Nausea or vomiting  Post-op Vital Signs: Reviewed and stable  Complications: No apparent anesthesia complications

## 2011-05-23 NOTE — Transfer of Care (Signed)
Immediate Anesthesia Transfer of Care Note  Patient: [name]  Procedure(s) Performed:  TRANSURETHRAL RESECTION OF THE PROSTATE (TURP) - placement of suprapubic catheter  Patient Location: PACU  Anesthesia Type: Spinal  Level of Consciousness: sedated and patient cooperative  Airway & Oxygen Therapy: Patient Spontanous Breathing and Patient connected to face mask oxygen  Post-op Assessment: Report given to PACU RN, Post -op Vital signs reviewed and stable and Patient moving all extremities  Post vital signs: Reviewed and stable  Complications: No apparent anesthesia complications and recall present

## 2011-05-23 NOTE — Progress Notes (Signed)
UR Chart Review Completed  

## 2011-05-23 NOTE — H&P (Signed)
[name], [name]               ACCOUNT NO.:  [id number]  MEDICAL RECORD NO.:  [id number]  LOCATION:  APPO                          FACILITY:  [facility]  PHYSICIAN:  Ky Barban, M.D.DATE OF BIRTH:  03-Oct-1960  DATE OF ADMISSION:  05/23/2011 DATE OF DISCHARGE:  LH                             HISTORY & PHYSICAL   CHIEF COMPLAINT:  Symptoms of prostatism.  HISTORY:  A 50 year old gentleman for long time had been following in the office, having difficulty to void.  Sometimes, he says he is voiding satisfactorily, but most of the time, he has to sit down and tried to empty his bladder, which he cannot and he is having lot of difficulty getting up 4-5 times at night, and he had developed urinary tract infection with positive urine culture, which was treated recently in the office.  Some time, his residual urine is over 200 mL.  Sometime, it is less than 1-2 ounces. When I had to cystoscopy, there is minimum enlargement of the prostate, so this is what I told him that, I can open up the bladder neck more and see if it helps with voiding.  If not, then we will just keep working on it.  I had another long talk with him and his mother this morning before the surgery in the holding area.  I explained this to them, and also I am thinking to put a Supra-Cath when I do TUR prostate so that when we take the Foley catheter out and if he goes into retention, we can use that to drain his bladder.  They understand and they said that is fine.  PAST MEDICAL HISTORY:  He has a history of having COPD, cirrhosis of the liver.  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 He also has bipolar disease.  FAMILY HISTORY:  No history of any significant pertinent medical problem.  PERSONAL HISTORY:  He is divorced.  Lives with his mother and does not smoke or drink.  REVIEW OF SYSTEMS:  Denies any chest pain,  orthopnea, PND, nausea, vomiting, fever, or chills.  PHYSICAL EXAMINATION:  VITAL SIGNS:  Blood pressure 130/80, temperature is normal. CENTRAL NERVOUS SYSTEM:  No gross neurological deficit.  HEAD, NECK, EYES, ENT:  Negative. CHEST:  Symmetrical. HEART:  Regular sinus rhythm.  No murmur. ABDOMEN:  Soft, flat.  Liver, spleen, kidneys not palpable. EXTERNAL GENITALIA:  Circumcised, meatus adequate.  Testicles are normal. RECTAL:  Prostate 1.5+ smooth and firm.  IMPRESSION:  Benign prostatic hypertrophy.  Difficulty to void.  PLAN:  TUR prostate.  Suprapubic percutaneous insertion of Supra-Cath. I will keep him overnight in the hospital tomorrow.  I can send him home, it is possible he goes home with a catheter.     Ky Barban, M.D.     MIJ/MEDQ  D:  05/23/2011  T:  05/23/2011  Job:  [id number]

## 2011-05-23 NOTE — Brief Op Note (Signed)
05/23/2011  11:35 AM  PATIENT:  [name]  50 y.o. male  PRE-OPERATIVE DIAGNOSIS:  bph, uti  POST-OPERATIVE DIAGNOSIS:  * No post-op diagnosis entered *  PROCEDURE:  Procedure(s): TRANSURETHRAL RESECTION OF THE PROSTATE (TURP)  SURGEON:  Surgeon(s): Ky Barban  PHYSICIAN ASSISTANT:   ASSISTANTS: none   ANESTHESIA:   spinal  EBL:  Total I/O In: 100 [I.V.:100] Out: -   BLOOD ADMINISTERED:none  DRAINS:also inserted per cutaneous cystocath Stamey's#14 catheter Urinary Catheter (Foley)   LOCAL MEDICATIONS USED:  NONE  SPECIMEN:  Source of Specimen:  prostate chips  DISPOSITION OF SPECIMEN:  PATHOLOGY  COUNTS:  YES  TOURNIQUET:  * No tourniquets in log *  DICTATION: .Other Dictation: Dictation Number [id number]  PLAN OF CARE: Admit for overnight observation  PATIENT DISPOSITION:  PACU - hemodynamically stable.   Delay start of Pharmacological VTE agent (>24hrs) due to surgical blood loss or risk of bleeding:  {YES/NO/NOT APPLICABLE:20182

## 2011-05-23 NOTE — Anesthesia Procedure Notes (Signed)
Spinal  Patient location during procedure: OR Start time: 05/23/2011 10:47 AM Staffing CRNA/Resident: Trayce Caravello J Performed by: resident/CRNA  Preanesthetic Checklist Completed: patient identified, site marked, surgical consent, pre-op evaluation, timeout performed, IV checked, risks and benefits discussed and monitors and equipment checked Spinal Block Patient position: right lateral decubitus Prep: Betadine Patient monitoring: heart rate, cardiac monitor, continuous pulse ox and blood pressure Approach: midline Location: L2-3 Injection technique: single-shot Needle Needle type: Spinocan  Needle gauge: 22 G Assessment Sensory level: T10 Additional Notes [id number] tray...Marland KitchenMarland Kitchen2013-09 expir

## 2011-05-23 NOTE — OR Nursing (Signed)
Pt. Voided 200 cc yellow urine

## 2011-05-23 NOTE — OR Nursing (Signed)
Ddr. Jerre Simon in to talk with pt. / Mother.   H and P dictaded  Via phone line.  Medical records called.  Dr. Jerre Simon also   Noted on chart [facility]) that he dictated this am.

## 2011-05-24 LAB — CBC
MCH: 29.9 pg (ref 26.0–34.0)
Platelets: 263 10*3/uL (ref 150–400)
RBC: 4.12 MIL/uL — ABNORMAL LOW (ref 4.22–5.81)
RDW: 13.8 % (ref 11.5–15.5)
WBC: 14.2 10*3/uL — ABNORMAL HIGH (ref 4.0–10.5)

## 2011-05-24 LAB — BASIC METABOLIC PANEL
Calcium: 8.9 mg/dL (ref 8.4–10.5)
Creatinine, Ser: 0.8 mg/dL (ref 0.50–1.35)
GFR calc non Af Amer: >90 mL/min (ref >90)
Sodium: 138 mEq/L (ref 135–145)

## 2011-05-24 MED ORDER — DETROL LA 4 MG PO CP24: 4.0000 mg | ORAL_CAPSULE | Freq: Every day | ORAL | Status: DC

## 2011-05-24 NOTE — Addendum Note (Signed)
Addendum  created 05/24/11 0736 by Carri Spillers, CRNA   Modules edited:Notes Section    

## 2011-05-24 NOTE — Progress Notes (Signed)
[phone]

## 2011-05-24 NOTE — Anesthesia Postprocedure Evaluation (Signed)
Anesthesia Post Note  Patient: [name]  Procedure(s) Performed:  TRANSURETHRAL RESECTION OF THE PROSTATE (TURP) - placement of suprapubic catheter  Anesthesia type: Spinal  Patient location: 340  Post pain: Pain level controlled  Post assessment: Post-op Vital signs reviewed, Patient's Cardiovascular Status Stable, Respiratory Function Stable, Patent Airway, No signs of Nausea or vomiting and Pain level controlled  Last Vitals:  Filed Vitals:   05/24/11 0536  BP: 130/81  Pulse: 85  Temp: 37.1 C  Resp: 16    Post vital signs: Reviewed and stable  Level of consciousness: awake and alert   Complications: No apparent anesthesia complications

## 2011-05-24 NOTE — Op Note (Signed)
NAMEMANLEY, FASON               ACCOUNT NO.:  [id number]  MEDICAL RECORD NO.:  [id number]  LOCATION:  A340                          FACILITY:  [facility]  PHYSICIAN:  Ky Barban, M.D.DATE OF BIRTH:  [birth year]  DATE OF PROCEDURE: DATE OF DISCHARGE:                              OPERATIVE REPORT   SURGEON:  Ky Barban, MD  PREOPERATIVE DIAGNOSIS:  Benign prostatic hypertrophy.  PREOPERATIVE DIAGNOSIS:  Benign prostatic hypertrophy.  PROCEDURE:  TUR prostate and percutaneous Cystocath.  ANESTHESIA:  Spinal.  PROCEDURE:  The patient under spinal anesthesia in lithotomy position. After usual prep and drape, #20 dilated and two 30-French with Sissy Hoff sound, then #28 [facility] resectoscope was introduced.  Bladder was inspected.  The resectoscope was pulled back in mid-prostatic urethra. The bladder neck was circumferentially resected down to the circular fibers.  Now, the resectoscope was pulled back at the level of the verumontanum, rotated to 11 o'clock position.  Right lobe was resected completely between 11 and 7 o'clock position along with the apical tissue carefully.  Similarly, the left lobe was resected completely and there was small amount of tissue in the anterior midline, which was resected at the end along with posterior midline tissue.  Prostatic urethra looks wide open.  Chips were evacuated.  Bleeders were coagulated.  The bladder was filled up.  Position of the bladder and suprapubic area were checked with the spinal needle, then a stab wound in the skin was made just above the pubic symphysis and Cystocath was introduced through it.  A #14 Stamey catheter and it was draining clear urine, and it was stabilized to the skin with a 0 silk stitch.  After removing the resectoscope, 22 three-way Foley catheter was left for CBI. CBI was started and the Cystocath was simply clamped.  Cystocath was applied.  The patient left the operating room in satisfactory  condition.     Ky Barban, M.D.     MIJ/MEDQ  D:  05/23/2011  T:  05/24/2011  Job:  [id number]

## 2011-05-24 NOTE — Progress Notes (Signed)
Pt discharged with instructions and prescriptions he and his mother who is his caregiver verbalized understanding.  I instructed and demonstrated how to change/convert both the leg bad and the regular drainage bag.  I also showed her how to empty both bags.  She verbalizes understanding.  Pt left the floor via W/c with staff and family in stable condition.   I also changed the dressing on his suprapubic catheter and removed 20cc of saline from his catheter bulb as directed by Dr. Jerre Simon.

## 2011-05-24 NOTE — Progress Notes (Signed)
CARE MANAGEMENT NOTE 05/24/2011  Patient:  [name], [name]   Account Number:  [id number]  Date Initiated:  05/24/2011  Documentation initiated by:  Rosemary Holms  Subjective/Objective Assessment:   pt admitted with bph, uti.Lives with parents     Action/Plan:   pt to be dc'd with [facility] - RN   Anticipated DC Date:  05/24/2011   Anticipated DC Plan:  HOME W HOME HEALTH SERVICES      DC Planning Services  CM consult      Choice offered to / List presented to:  C-6 Parent        HH arranged  HH-1 RN      [facility] agency  [facility]   Status of service:  Completed, signed off Medicare Important Message given?   (If response is "NO", the following Medicare IM given date fields will be blank) Date Medicare IM given:   Date Additional Medicare IM given:    Discharge Disposition:  HOME W HOME HEALTH SERVICES  Per UR Regulation:    Comments:  05/24/11 1500 Herta Hink Leanord Hawking RN BSN Pt discharge with [facility] set up with [facility] for RN assistance with folies.

## 2011-05-24 NOTE — Progress Notes (Signed)
UR Chart Review Completed  

## 2011-05-25 NOTE — Progress Notes (Signed)
[name], [name]               ACCOUNT NO.:  [id number]  MEDICAL RECORD NO.:  [id number]  LOCATION:  A340                          FACILITY:  [facility]  PHYSICIAN:  Ky Barban, M.D.DATE OF BIRTH:  12/05/1960  DATE OF PROCEDURE: DATE OF DISCHARGE:  05/24/2011                                PROGRESS NOTE   Mr. Netz' general medical condition is well.  His CBI is absolutely clear.  He is sitting in the bed, having no specific complaints. Temperature is 98.7, and his white count is slightly up to 14.2, hematocrit is stable at 38.4, and my plan is to discharge him home with a Foley catheter, which I will remove on Monday in the office.  I told him to come on Monday, in the office if he has any fever to let me know. I am not giving him any more medicine.  He takes his medicines at home for his anxiety.  He can continue taking that.     Ky Barban, M.D.     MIJ/MEDQ  D:  05/24/2011  T:  05/25/2011  Job:  [id number]

## 2011-05-29 ENCOUNTER — Encounter (HOSPITAL_COMMUNITY): Payer: Self-pay | Admitting: Urology

## 2012-01-22 ENCOUNTER — Encounter (HOSPITAL_COMMUNITY): Payer: Self-pay | Admitting: Emergency Medicine

## 2012-01-22 ENCOUNTER — Observation Stay (HOSPITAL_COMMUNITY)
Admission: EM | Admit: 2012-01-22 | Discharge: 2012-01-23 | Disposition: A | Discharge status: Home / Self Care | Payer: Medicaid Other | Attending: Internal Medicine | Admitting: Internal Medicine

## 2012-01-22 ENCOUNTER — Emergency Department (HOSPITAL_COMMUNITY): Payer: Medicaid Other

## 2012-01-22 DIAGNOSIS — J4489 Other specified chronic obstructive pulmonary disease: Secondary | ICD-10-CM | POA: Insufficient documentation

## 2012-01-22 DIAGNOSIS — K5900 Constipation, unspecified: Secondary | ICD-10-CM | POA: Insufficient documentation

## 2012-01-22 DIAGNOSIS — K5641 Fecal impaction: Principal | ICD-10-CM | POA: Insufficient documentation

## 2012-01-22 DIAGNOSIS — J449 Chronic obstructive pulmonary disease, unspecified: Secondary | ICD-10-CM | POA: Insufficient documentation

## 2012-01-22 MED ORDER — LITHIUM CARBONATE 150 MG PO CAPS
300.0000 mg | ORAL_CAPSULE | Freq: Three times a day (TID) | ORAL | Status: DC
  Administered 2012-01-22 – 2012-01-23 (×2): 300 mg via ORAL
  Filled 2012-01-22 (×2): qty 2

## 2012-01-22 MED ORDER — FENTANYL 100 MCG/HR TD PT72
300.0000 ug | MEDICATED_PATCH | TRANSDERMAL | Status: DC
  Filled 2012-01-22: qty 1

## 2012-01-22 MED ORDER — DIAZEPAM 5 MG PO TABS
10.0000 mg | ORAL_TABLET | Freq: Three times a day (TID) | ORAL | Status: DC
  Administered 2012-01-22 (×2): 10 mg via ORAL
  Filled 2012-01-22 (×2): qty 2

## 2012-01-22 MED ORDER — LIDOCAINE VISCOUS 2 % MT SOLN
OROMUCOSAL | Status: AC
  Administered 2012-01-22: 15 mL via RECTAL
  Filled 2012-01-22: qty 15

## 2012-01-22 MED ORDER — FESOTERODINE FUMARATE ER 4 MG PO TB24
4.0000 mg | ORAL_TABLET | Freq: Every day | ORAL | Status: DC
  Administered 2012-01-22: 4 mg via ORAL
  Filled 2012-01-22 (×3): qty 1

## 2012-01-22 MED ORDER — OXYCODONE HCL 5 MG PO TABS: 5.0000 mg | ORAL_TABLET | Freq: Four times a day (QID) | ORAL | Status: DC | PRN

## 2012-01-22 MED ORDER — NICOTINE 21 MG/24HR TD PT24
21.0000 mg | MEDICATED_PATCH | Freq: Every day | TRANSDERMAL | Status: DC
  Administered 2012-01-22: 21 mg via TRANSDERMAL
  Filled 2012-01-22: qty 1

## 2012-01-22 MED ORDER — LEVOTHYROXINE SODIUM 75 MCG PO TABS
75.0000 ug | ORAL_TABLET | Freq: Every day | ORAL | Status: DC
  Administered 2012-01-22: 75 ug via ORAL
  Filled 2012-01-22: qty 1

## 2012-01-22 MED ORDER — PROMETHAZINE HCL 12.5 MG PO TABS: 25.0000 mg | ORAL_TABLET | Freq: Four times a day (QID) | ORAL | Status: DC | PRN

## 2012-01-22 MED ORDER — BUPROPION HCL ER (XL) 150 MG PO TB24
150.0000 mg | ORAL_TABLET | ORAL | Status: DC
  Administered 2012-01-23: 150 mg via ORAL
  Filled 2012-01-22 (×2): qty 1

## 2012-01-22 MED ORDER — DULOXETINE HCL 60 MG PO CPEP
60.0000 mg | ORAL_CAPSULE | Freq: Two times a day (BID) | ORAL | Status: DC
  Administered 2012-01-22 (×2): 60 mg via ORAL
  Filled 2012-01-22 (×2): qty 1

## 2012-01-22 MED ORDER — FLEET ENEMA 7-19 GM/118ML RE ENEM
1.0000 | ENEMA | Freq: Once | RECTAL | Status: AC
  Administered 2012-01-22: 1 via RECTAL

## 2012-01-22 MED ORDER — POLYETHYLENE GLYCOL 3350 17 G PO PACK
17.0000 g | PACK | Freq: Every day | ORAL | Status: DC
  Administered 2012-01-22: 17 g via ORAL
  Filled 2012-01-22: qty 1

## 2012-01-22 NOTE — ED Notes (Signed)
Requested something to drink- cranberry juice provided.  States he still does not feel like he has to go to the bathroom yet.

## 2012-01-22 NOTE — ED Notes (Signed)
Red rubber catheter used to insert in rectum and syring of viscous lidocaine injected via catheter followed by syring full of lubafax.  Pain in rectum decreasing - will attempt BM again in 5 minutes

## 2012-01-22 NOTE — Progress Notes (Signed)
UR Chart Review Completed  

## 2012-01-22 NOTE — ED Notes (Addendum)
Patient ambulated to the bathroom with assistance. States he is unable to have a bowel movement at this time. Patient is complaining of pain. Advised Dr Colon Branch. No further orders at this time.

## 2012-01-22 NOTE — ED Notes (Signed)
Mother informed by Dr Colon Branch of plan to contact PCP for admission.  Patient sent for KUB

## 2012-01-22 NOTE — ED Notes (Signed)
MD at bedside.- Dr Strand 

## 2012-01-22 NOTE — ED Provider Notes (Signed)
History     CSN: [id number]  Arrival date & time 01/22/12  0149   First MD Initiated Contact with Patient 01/22/12 0310      Chief Complaint  Patient presents with  . Fecal Impaction    (Consider location/radiation/quality/duration/timing/severity/associated sxs/prior treatment) HPI  [name] is a 51 y.o. male who presents to the Emergency Department complaining of constipation and unable to pass stool in the rectum. Last BM was two days ago. Given a Fleet enema earlier tonight with little result. Denies fever, chills, nausea, vomiting.  PCP Dr. Juanetta Gosling  Past Medical History  Diagnosis Date  . COPD (chronic obstructive pulmonary disease)   . Hypothyroidism   . BPH (benign prostatic hyperplasia)   . Anxiety   . Depression   . Cirrhosis   . Pancreatitis chronic   . Epicondylitis     hips    Past Surgical History  Procedure Date  . Total hip arthroplasty 1 year ago    right hip-[place]  . Appendectomy age 76  . Femur fracture surgery     otif  . Transurethral resection of prostate 05/23/2011    Procedure: TRANSURETHRAL RESECTION OF THE PROSTATE (TURP);  Surgeon: Ky Barban;  Location: [facility];  Service: Urology;  Laterality: N/A;  placement of suprapubic catheter    Family History  Problem Relation Age of Onset  . Anesthesia problems Neg Hx   . Hypotension Neg Hx   . Pseudochol deficiency Neg Hx   . Malignant hyperthermia Neg Hx     History  Substance Use Topics  . Smoking status: Current Everyday Smoker -- 1.5 packs/day for 30 years    Types: Cigarettes  . Smokeless tobacco: Not on file  . Alcohol Use: No     stopped 2003      Review of Systems  Constitutional: Negative for fever.       10 Systems reviewed and are negative for acute change except as noted in the HPI.  HENT: Negative for congestion.   Eyes: Negative for discharge and redness.  Respiratory: Negative for cough and shortness of breath.   Cardiovascular: Negative for  chest pain.  Gastrointestinal: Positive for constipation and rectal pain. Negative for vomiting and abdominal pain.  Musculoskeletal: Negative for back pain.  Skin: Negative for rash.  Neurological: Negative for syncope, numbness and headaches.  Psychiatric/Behavioral:       No behavior change.    Allergies  Vancomycin; Codeine; and Penicillins  Home Medications   Current Outpatient Rx  Name Route Sig Dispense Refill  . BUPROPION HCL ER (XL) 150 MG PO TB24 Oral Take 150 mg by mouth every morning.      Marland Kitchen DIAZEPAM 5 MG PO TABS Oral Take 10 mg by mouth 3 (three) times daily.      . DULOXETINE HCL 60 MG PO CPEP Oral Take 60 mg by mouth 2 (two) times daily.      . FENTANYL 100 MCG/HR TD PT72 Transdermal Place 3 patches onto the skin every other day. This dose was verified, it is correct     . LEVOTHYROXINE SODIUM 75 MCG PO TABS Oral Take 75 mcg by mouth daily.      Marland Kitchen LITHIUM CARBONATE 300 MG PO CAPS Oral Take 300 mg by mouth 3 (three) times daily with meals.      . OXYCODONE HCL 5 MG PO TABS Oral Take 5 mg by mouth every 8 (eight) hours as needed. pain     . PROMETHAZINE HCL  25 MG PO TABS Oral Take 25 mg by mouth every 6 (six) hours as needed. Nausea and vomiting     . DETROL LA 4 MG PO CP24 Oral Take 1 capsule (4 mg total) by mouth daily. 30 capsule 0    Dispense as written.    BP 113/79  Pulse 84  Temp 98.4 F (36.9 C) (Oral)  Resp 20  Ht 5\' 7"  (1.702 m)  Wt 155 lb (70.308 kg)  BMI 24.28 kg/m2  SpO2 94%  Physical Exam  Nursing note and vitals reviewed. Constitutional: He appears well-developed and well-nourished.       Awake, alert, nontoxic appearance.  HENT:  Head: Atraumatic.  Eyes: Right eye exhibits no discharge. Left eye exhibits no discharge.  Neck: Neck supple.  Cardiovascular: Normal heart sounds.   Pulmonary/Chest: Effort normal. He exhibits no tenderness.  Abdominal: Soft. There is no tenderness. There is no rebound.       Rectal exam with a large amount of  soft stool in the rectal vault. Brown, guaiac negative   Musculoskeletal: Normal range of motion. He exhibits no tenderness.       Baseline ROM, no obvious new focal weakness.  Neurological:       Mental status and motor strength appears baseline for patient and situation.  Skin: No rash noted.  Psychiatric: He has a normal mood and affect.    ED Course  Procedures (including critical care time) [phone] Patient given fleet enema with no results. 0400 Several attempts in the bathroom without success. 9604 Administered viscous lidocaine and lubricant.No results.  7:09 AM:  T/C to [facility]  case discussed, including:  HPI, pertinent PM/SHx, VS/PE, dx testing, ED course and treatment.  Agreeable to admission for observation.  Requests to write temporary orders, med surg bed. Request temp orders include enema.   MDM  Patient with constipation x 2 days, unable to pass stool currently in the rectum. Given fleet enema and viscous lidocaine to assist with rectal pain. Spoke with Dr. Felecia Shelling for obs admission for enema administration.Pt stable in ED with no significant deterioration in condition.The patient appears reasonably stabilized for admission considering the current resources, flow, and capabilities available in the ED at this time, and I doubt any other [facility] requiring further screening and/or treatment in the ED prior to admission.  MDM Reviewed: nursing note and vitals Interpretation: x-ray           Nicoletta Dress. Colon Branch, MD 01/22/12 [phone]

## 2012-01-22 NOTE — H&P (Signed)
[name] MRN: [id number] DOB/AGE: 12/03/1960 51 y.o. Primary Care Physician:HAWKINS,EDWARD L, MD Admit date: 01/22/2012 Chief Complaint:  constipation HPI: This is a 51 years old with history of multiple medical illness came due to severe constipation. Fleet enema was try in Er but was not successful.  Past Medical History  Diagnosis Date  . COPD (chronic obstructive pulmonary disease)   . Hypothyroidism   . BPH (benign prostatic hyperplasia)   . Anxiety   . Depression   . Cirrhosis   . Pancreatitis chronic   . Epicondylitis     hips   Past Surgical History  Procedure Date  . Total hip arthroplasty 1 year ago    right hip-[place]  . Appendectomy age 30  . Femur fracture surgery     otif  . Transurethral resection of prostate 05/23/2011    Procedure: TRANSURETHRAL RESECTION OF THE PROSTATE (TURP);  Surgeon: Ky Barban;  Location: [facility];  Service: Urology;  Laterality: N/A;  placement of suprapubic catheter        Family History  Problem Relation Age of Onset  . Anesthesia problems Neg Hx   . Hypotension Neg Hx   . Pseudochol deficiency Neg Hx   . Malignant hyperthermia Neg Hx     Social History:  reports that he has been smoking Cigarettes.  He has a 45 pack-year smoking history. He does not have any smokeless tobacco history on file. He reports that he uses illicit drugs (Marijuana). He reports that he does not drink alcohol.   Allergies:  Allergies  Allergen Reactions  . Penicillins Anaphylaxis  . Vancomycin     redman syndrome  . Codeine Hives    Medications Prior to Admission  Medication Sig Dispense Refill  . buPROPion (WELLBUTRIN XL) 150 MG 24 hr tablet Take 150 mg by mouth every morning.        . diazepam (VALIUM) 10 MG tablet Take 10 mg by mouth every 8 (eight) hours as needed. For anxiety      . DULoxetine (CYMBALTA) 60 MG capsule Take 60 mg by mouth 2 (two) times daily.        . fentaNYL (DURAGESIC - DOSED MCG/HR) 100 MCG/HR Place 3  patches onto the skin every other day. This dose was verified, it is correct       . levothyroxine (SYNTHROID, LEVOTHROID) 75 MCG tablet Take 75 mcg by mouth daily.        Marland Kitchen lithium carbonate 300 MG capsule Take 300 mg by mouth 3 (three) times daily with meals.        Marland Kitchen oxyCODONE (OXY IR/ROXICODONE) 5 MG immediate release tablet Take 5 mg by mouth every 8 (eight) hours as needed. pain       . promethazine (PHENERGAN) 25 MG tablet Take 25 mg by mouth every 6 (six) hours as needed. Nausea and vomiting            ZHY:QMVHQ from the symptoms mentioned above,there are no other symptoms referable to all systems reviewed.  Physical Exam: Blood pressure 116/81, pulse 76, temperature 98.1 F (36.7 C), temperature source Oral, resp. rate 19, height 5\' 7"  (1.702 m), weight 71.351 kg (157 lb 4.8 oz), SpO2 97.00%.  HE ENT: pupils equal and reactive, neck supple Respiratory: Clear lung field  Cardiovascular: S1 and s2 heard no murmur or gallop.  Abdomen- soft and lax bowel sound +  Extremities  Negative for leg edema  No results found for this basename: WBC:2,NEUTROABS:2,HGB:2,HCT:2,MCV:2,PLT:2 in the last  72 hours No results found for this basename: NA:2,K:2,CL:2,CO2:2,GLUCOSE:2,BUN:2,CREATININE:2,CALCIUM:2,MG:2,PHO in the last 72 hourslablast2(ast:2,ALT:2,alkphos:2,bilitot:2,prot:2,albumin:2)@    No results found for this or any previous visit (from the past 240 hour(s)).   Dg Abd 1 View  01/22/2012  *RADIOLOGY REPORT*  Clinical Data: Fecal impaction.  ABDOMEN - 1 VIEW  Comparison: CT abdomen 07/10/2010.  Findings: Moderate stool noted throughout the abdomen and down into the rectum.  There are air filled small bowel loops but no distention.  The soft tissue shadows are maintained.  No worrisome calcifications.  The bony structures are intact.  The right hip prosthesis is noted.  IMPRESSION: Moderate stool throughout the colon.  No findings for small bowel obstruction or free air.  Original Report  Authenticated By: P. Loralie Champagne, M.D.   Impression: Fecalimpaction Active Problems:  * No active hospital problems. *      Plan: We will try S & s enema Continue regular medications.      Lakeyta Vandenheuvel Pager [phone]  01/22/2012, 8:19 PM

## 2012-01-22 NOTE — ED Notes (Signed)
Ambulatory to bathroom to see if he can defecate.

## 2012-01-22 NOTE — Progress Notes (Signed)
Patient and patient's mother told not to go off floor. Patient's mother had patient in wheelchair and this RN told them again to stay on the floor. Patient and his mother are off the floor.

## 2012-01-22 NOTE — ED Notes (Signed)
Unable to have a BM   Mother states she gave him a fleet enema without results.  Pt states he has had an impaction once before.

## 2012-01-22 NOTE — ED Notes (Signed)
Patient in left lat sims position - advised to hold enema as long as he can - call for assistance and will help to BR when needed.  Mother remains in room with patient

## 2012-01-22 NOTE — ED Notes (Signed)
No results from visit to bathroom - MD informed

## 2012-01-23 MED ORDER — POLYETHYLENE GLYCOL 3350 17 G PO PACK: 17.0000 g | PACK | Freq: Every day | ORAL | Status: AC

## 2012-01-23 NOTE — Progress Notes (Signed)
Patient in stable condition and transported out by tech.  

## 2012-01-23 NOTE — Progress Notes (Signed)
Discharge instructions reviewed with patient and his mother, both voiced understanding. Patient given discharge instructions and both informed to pick up prescription at [facility]. Patient's mother signed discharge instructions per patient's request.

## 2012-01-23 NOTE — Discharge Planning (Signed)
Physician Discharge Summary  Patient ID: [name] MRN: [id number] DOB/AGE: 06/25/1960 52 y.o. Primary Care Physician:HAWKINS,EDWARD L, MD Admit date: 01/22/2012 Discharge date: 01/23/2012    Discharge Diagnoses:  .Fecal impaction  COPD (chronic obstructive pulmonary disease)  .  Hypothyroidism  .  BPH (benign prostatic hyperplasia)  .  Anxiety  .  Depression  .  Cirrhosis  .  Pancreatitis chronic  .  Epicondylitis  hips    Active Problems:  * No active hospital problems. *    Medication List  As of 01/23/2012  8:07 AM   TAKE these medications         buPROPion 150 MG 24 hr tablet   Commonly known as: WELLBUTRIN XL   Take 150 mg by mouth every morning.      diazepam 10 MG tablet   Commonly known as: VALIUM   Take 10 mg by mouth every 8 (eight) hours as needed. For anxiety      DULoxetine 60 MG capsule   Commonly known as: CYMBALTA   Take 60 mg by mouth 2 (two) times daily.      fentaNYL 100 MCG/HR   Commonly known as: DURAGESIC - dosed mcg/hr   Place 3 patches onto the skin every other day. This dose was verified, it is correct      levothyroxine 75 MCG tablet   Commonly known as: SYNTHROID, LEVOTHROID   Take 75 mcg by mouth daily.      lithium carbonate 300 MG capsule   Take 300 mg by mouth 3 (three) times daily with meals.      oxyCODONE 5 MG immediate release tablet   Commonly known as: Oxy IR/ROXICODONE   Take 5 mg by mouth every 8 (eight) hours as needed. pain      polyethylene glycol packet   Commonly known as: MIRALAX / GLYCOLAX   Take 17 g by mouth daily.      promethazine 25 MG tablet   Commonly known as: PHENERGAN   Take 25 mg by mouth every 6 (six) hours as needed. Nausea and vomiting            Discharged Condition: stable    Consults:None  Significant Diagnostic Studies: Dg Abd 1 View  01/22/2012  *RADIOLOGY REPORT*  Clinical Data: Fecal impaction.  ABDOMEN - 1 VIEW  Comparison: CT abdomen 07/10/2010.  Findings:  Moderate stool noted throughout the abdomen and down into the rectum.  There are air filled small bowel loops but no distention.  The soft tissue shadows are maintained.  No worrisome calcifications.  The bony structures are intact.  The right hip prosthesis is noted.  IMPRESSION: Moderate stool throughout the colon.  No findings for small bowel obstruction or free air.  Original Report Authenticated By: P. Loralie Champagne, M.D.    Lab Results: Basic Metabolic Panel: No results found for this basename: NA:2,K:2,CL:2,CO2:2,GLUCOSE:2,BUN:2,CREATININE:2,CALCIUM:2,MG:2,PHOS:2 in the last 72 hours Liver Function Tests: No results found for this basename: AST:2,ALT:2,ALKPHOS:2,BILITOT:2,PROT:2,ALBUMIN:2 in the last 72 hours   CBC: No results found for this basename: WBC:2,NEUTROABS:2,HGB:2,HCT:2,MCV:2,PLT:2 in the last 72 hours  No results found for this or any previous visit (from the past 240 hour(s)).   Hospital Course: Patient was brought to Er due to severe constipation. Fleet enema was given in ER but no good result. He was admitted for observation and S-S enema was given and patient was relieved.  Discharge Exam: Blood pressure 98/65, pulse 80, temperature 98 F (36.7 C), temperature source Oral, resp. rate 20,  height 5\' 7"  (1.702 m), weight 71.351 kg (157 lb 4.8 oz), SpO2 100.00%.   Disposition: home    Follow-up Information    Follow up with HAWKINS,EDWARD L, MD in 2 weeks.   Contact information:   [address] [place] [address] [phone]          Signed: Avon Gully   01/23/2012, 8:07 AM

## 2012-02-25 ENCOUNTER — Encounter: Payer: Self-pay | Admitting: Urgent Care

## 2012-02-26 ENCOUNTER — Ambulatory Visit: Payer: Medicaid Other | Admitting: Urgent Care

## 2012-02-26 ENCOUNTER — Telehealth: Payer: Self-pay | Admitting: Urgent Care

## 2012-02-26 NOTE — Telephone Encounter (Signed)
Pt was a no show

## 2012-02-26 NOTE — Telephone Encounter (Signed)
noted 

## 2012-03-11 ENCOUNTER — Ambulatory Visit (INDEPENDENT_AMBULATORY_CARE_PROVIDER_SITE_OTHER): Payer: Medicaid Other | Admitting: Internal Medicine

## 2012-03-17 ENCOUNTER — Encounter (INDEPENDENT_AMBULATORY_CARE_PROVIDER_SITE_OTHER): Payer: Self-pay | Admitting: Internal Medicine

## 2012-03-17 ENCOUNTER — Ambulatory Visit (INDEPENDENT_AMBULATORY_CARE_PROVIDER_SITE_OTHER): Payer: Medicaid Other | Admitting: Internal Medicine

## 2012-03-17 VITALS — BP 120/70 | HR 80 | Temp 98.1°F | Ht 67.0 in | Wt 150.5 lb

## 2012-03-17 DIAGNOSIS — K7460 Unspecified cirrhosis of liver: Secondary | ICD-10-CM

## 2012-03-17 DIAGNOSIS — K5900 Constipation, unspecified: Secondary | ICD-10-CM

## 2012-03-17 NOTE — Patient Instructions (Addendum)
Continue Linzess. OV in 3 months.

## 2012-03-17 NOTE — Progress Notes (Addendum)
Subjective:     Patient ID: [name], male   DOB: [birth year], 51 y.o.   MRN: [id number]  HPI [name] is a 51 yr old male referred to our office by Dr. Juanetta Gosling.  He tells me he was impacted 2 yrs ago and he was admitted and was manually removed. He usually have a BM every day, but 3 weeks ago he was constipated.  He tells me he disipacted himself manually. He removed what he describes as a piece of mesh. Since then he has had no further problems with constipation.   He saw Dr. Juanetta Gosling and was placed on Linzess.  Since starting the LInzess he BMs have been normal. Stools are soft.   Appetite is good. He has had no weight loss. No rectal bleeding or melena.   02/28/2008: IMPRESSION: EGD, normal esophagus status post passage of the [facility]  dilator empirically, small hiatal hernia, and antral erosions;  otherwise, unremarkable stomach, patent pylorus, and bulbar erosions;  otherwise, D1 and D2 appeared unremarkable.  COLONOSCOPY FINDINGS: Normal rectum. Multiple colonic polyps were  resected as described above. Poor prep. Stool sample obtained.  Terminal ileum not intubated.  Biopsy:   1. COLON AT SPLENIC FLEXURE, POLYP: TUBULAR ADENOMA. NO HIGH GRADE DYSPLASIA OR MALIGNANCY IDENTIFIED.  2. DESCENDING COLON, POLYP: TUBULAR ADENOMA. NO HIGH GRADE DYSPLASIA OR MALIGNANCY IDENTIFIED.     Review of Systems See hpi Current Outpatient Prescriptions  Medication Sig Dispense Refill  . buPROPion (WELLBUTRIN XL) 150 MG 24 hr tablet Take 150 mg by mouth every morning.        . diazepam (VALIUM) 10 MG tablet Take 10 mg by mouth every 8 (eight) hours as needed. For anxiety       . DULoxetine (CYMBALTA) 60 MG capsule Take 60 mg by mouth 2 (two) times daily.        . fentaNYL (DURAGESIC - DOSED MCG/HR) 100 MCG/HR Place 3 patches onto the skin every other day. This dose was verified, it is correct       . levothyroxine (SYNTHROID, LEVOTHROID) 75 MCG tablet Take 88 mcg by mouth daily.       Marland Kitchen  Linaclotide (LINZESS) 290 MCG CAPS Take by mouth.      . lithium carbonate 300 MG capsule Take 300 mg by mouth 3 (three) times daily with meals.        Marland Kitchen oxyCODONE (OXY IR/ROXICODONE) 5 MG immediate release tablet Take 5 mg by mouth every 8 (eight) hours as needed. pain       . promethazine (PHENERGAN) 25 MG tablet Take 25 mg by mouth every 6 (six) hours as needed. Nausea and vomiting        Past Medical History  Diagnosis Date  . COPD (chronic obstructive pulmonary disease)   . Hypothyroidism   . BPH (benign prostatic hyperplasia)   . Anxiety   . Depression   . Cirrhosis   . Pancreatitis chronic   . Epicondylitis     hips   Past Surgical History  Procedure Date  . Total hip arthroplasty 1 year ago    right hip-[place]  . Appendectomy age 27  . Femur fracture surgery     otif  . Transurethral resection of prostate 05/23/2011    Procedure: TRANSURETHRAL RESECTION OF THE PROSTATE (TURP);  Surgeon: Ky Barban;  Location: [facility];  Service: Urology;  Laterality: N/A;  placement of suprapubic catheter   Family Status  Relation Status Death Age  . Mother Alive  good health  . Father Deceased     etoh abuse  . Sister Alive     good health   History   Social History  . Marital Status: Divorced    Spouse Name: N/A    Number of Children: N/A  . Years of Education: N/A   Occupational History  . Not on file.   Social History Main Topics  . Smoking status: Current Every Day Smoker -- 1.5 packs/day for 30 years    Types: Cigarettes  . Smokeless tobacco: Not on file  . Alcohol Use: No     stopped 2003  . Drug Use: Yes    Special: Marijuana     stopped 1 year ago  . Sexually Active: Not on file   Other Topics Concern  . Not on file   Social History Narrative  . No narrative on file  ' Allergies  Allergen Reactions  . Penicillins Anaphylaxis  . Vancomycin     redman syndrome  . Codeine Hives       Objective:   Physical Exam Filed Vitals:    03/17/12 1609  BP: 120/70  Pulse: 80  Temp: 98.1 F (36.7 C)  Height: 5\' 7"  (1.702 m)  Weight: 150 lb 8 oz (68.266 kg)   Alert and oriented. Skin warm and dry. Oral mucosa is moist.   . Sclera anicteric, conjunctivae is pink. Thyroid not enlarged. No cervical lymphadenopathy. Lungs clear. Heart regular rate and rhythm.  Abdomen is soft. Bowel sounds are positive. No hepatomegaly. No abdominal masses felt. No tenderness.  No edema to lower extremities. Patient is alert and oriented.     Assessment:    Constipation, now resolved. Possibly narcotic induced. Hx of colonic polyp.    Plan:    Follow up in 3 months. Will put on recall for a colonoscopy in one year.  Continue the Linzess.

## 2012-03-17 NOTE — Progress Notes (Deleted)
Subjective:     Patient ID: [name], male   DOB: [birth year], 51 y.o.   MRN: [id number]  HPI  02/28/2008 EGD/Colonoscopy:  Abnormal CT [facility]  dilator empirically, small hiatal hernia, and antral erosions;  otherwise, unremarkable stomach, patent pylorus, and bulbar erosions;  otherwise, D1 and D2 appeared unremarkable.  COLONOSCOPY FINDINGS: Normal rectum. Multiple colonic polyps were  resected as described above. Poor prep. Stool sample obtained.  Terminal ileum not intubated.  Biopsy:  1. COLON AT SPLENIC FLEXURE, POLYP: TUBULAR ADENOMA. NO HIGH GRADE DYSPLASIA OR MALIGNANCY IDENTIFIED.  2. DESCENDING COLON, POLYP: TUBULAR ADENOMA. NO HIGH GRADE DYSPLASIA OR MALIGNANCY IDENTIFIED.    Review of Systems     Objective:   Physical Exam     Assessment:     ***    Plan:     ***

## 2012-06-08 ENCOUNTER — Ambulatory Visit (INDEPENDENT_AMBULATORY_CARE_PROVIDER_SITE_OTHER): Payer: Medicaid Other | Admitting: Internal Medicine

## 2012-06-15 ENCOUNTER — Ambulatory Visit (INDEPENDENT_AMBULATORY_CARE_PROVIDER_SITE_OTHER): Payer: Medicaid Other | Admitting: Internal Medicine

## 2012-06-29 ENCOUNTER — Ambulatory Visit (INDEPENDENT_AMBULATORY_CARE_PROVIDER_SITE_OTHER): Payer: Medicaid Other | Admitting: Internal Medicine

## 2012-07-09 ENCOUNTER — Ambulatory Visit (INDEPENDENT_AMBULATORY_CARE_PROVIDER_SITE_OTHER): Payer: Medicaid Other | Admitting: Internal Medicine

## 2012-07-09 ENCOUNTER — Encounter (INDEPENDENT_AMBULATORY_CARE_PROVIDER_SITE_OTHER): Payer: Self-pay | Admitting: Internal Medicine

## 2012-07-09 VITALS — BP 138/90 | HR 92 | Temp 98.2°F | Ht 67.0 in | Wt 155.9 lb

## 2012-07-09 DIAGNOSIS — E039 Hypothyroidism, unspecified: Secondary | ICD-10-CM

## 2012-07-09 DIAGNOSIS — B1920 Unspecified viral hepatitis C without hepatic coma: Secondary | ICD-10-CM

## 2012-07-09 DIAGNOSIS — K7460 Unspecified cirrhosis of liver: Secondary | ICD-10-CM

## 2012-07-09 NOTE — Patient Instructions (Addendum)
Labs for Hepatitis C. OV in 3 months. Continue present medication

## 2012-07-09 NOTE — Progress Notes (Signed)
Subjective:     Patient ID: [name], male   DOB: [birth year], 52 y.o.   MRN: [id number]  HPIHere today for f/u of his constipation. He tells me his constipation is much better. He has finished the Linzess. He tells me he has a BM everyday. No melena or bright red rectal bleeding.  Appetite is good for the most part. He has gained about 6 pounds since his last visit. He also states he has a hx of cirrhosis from etoh. He has received Hep A and B vaccines. 01/01/2008 Liver Biopsy:  COMMENT 1. The liver biopsy specimen shows chronic active hepatitis in the form of periportal acute and chronic inflammation with extension into the hepatic parenchyma. There is bile duct proliferation as well as periportal fibrosis with portal to portal bridging, consistent with cirrhosis. This bridging is highlighted by the reticulin and trichrome stains. In addition, there is mild micro- and macrosteatosis. An iron stain fails to highlight the presence of increased iron storage. This hepatitis is best classified as grade 2, stage 3-4. (JBK:gt, 01/04/08)  Hep C antibody. Last viral load negative.12/2007  07/25/2010 MRI abdomen w/wo:  IMPRESSION:  1. There are no complicating features associated with the left  renal cyst. 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  2. Suspect liver cirrhosis.  3. Fatty infiltration of the liver      No etoh x 14 yrs.  Denies prior hx of ascites. Also states he has a hx of pancreatitis.  02/28/2008 EGD/Colonoscopy: IMPRESSION: EGD, normal esophagus status post passage of the [facility]  dilator empirically, small hiatal hernia, and antral erosions;  otherwise, unremarkable stomach, patent pylorus, and bulbar erosions;  otherwise, D1 and D2 appeared unremarkable.  COLONOSCOPY FINDINGS: Normal rectum. Multiple colonic polyps were  resected as described above. Poor prep. Stool sample obtained.  Terminal ileum not intubated.  The patient's symptoms of nausea, vomiting,  and abdominal pain are out  of proportion to objective findings. 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   Review of Systems see hpi Current Outpatient Prescriptions  Medication Sig Dispense Refill  . buPROPion (WELLBUTRIN XL) 150 MG 24 hr tablet Take 150 mg by mouth every morning.        . diazepam (VALIUM) 10 MG tablet Take 10 mg by mouth every 8 (eight) hours as needed. For anxiety       . DULoxetine (CYMBALTA) 60 MG capsule Take 60 mg by mouth 2 (two) times daily.        . fentaNYL (DURAGESIC - DOSED MCG/HR) 100 MCG/HR Place 3 patches onto the skin every other day. This dose was verified, it is correct       . levothyroxine (SYNTHROID, LEVOTHROID) 75 MCG tablet Take 88 mcg by mouth daily.       Marland Kitchen lithium carbonate 300 MG capsule Take 300 mg by mouth 3 (three) times daily with meals.        Marland Kitchen oxyCODONE (OXY IR/ROXICODONE) 5 MG immediate release tablet Take 5 mg by mouth every 8 (eight) hours as needed. pain       . promethazine (PHENERGAN) 25 MG tablet Take 25 mg by mouth every 6 (six) hours as needed. Nausea and vomiting       . Linaclotide (LINZESS) 290 MCG CAPS Take by mouth.       Past Medical History  Diagnosis Date  . COPD (chronic obstructive pulmonary disease)   . Hypothyroidism   .  BPH (benign prostatic hyperplasia)   . Anxiety   . Depression   . Cirrhosis   . Pancreatitis chronic   . Epicondylitis     hips  . Hepatitis C    Past Surgical History  Procedure Date  . Total hip arthroplasty 1 year ago    right hip-  . Appendectomy age 81  . Femur fracture surgery     otif  . Transurethral resection of prostate 05/23/2011    Procedure: TRANSURETHRAL RESECTION OF THE PROSTATE (TURP);  Surgeon: Ky Barban;  Location: [facility];  Service: Urology;  Laterality: N/A;  placement of suprapubic catheter   Allergies  Allergen Reactions  . Penicillins Anaphylaxis  . Vancomycin     redman  syndrome  . Codeine Hives       Objective:   Physical Exam  Filed Vitals:   07/09/12 1424  BP: 138/90  Pulse: 92  Temp: 98.2 F (36.8 C)  Height: 5\' 7"  (1.702 m)  Weight: 155 lb 14.4 oz (70.716 kg)   .Alert and oriented. Skin warm and dry. Oral mucosa is moist.   . Sclera anicteric, conjunctivae is pink. Thyroid not enlarged. No cervical lymphadenopathy. Lungs clear. Heart regular rate and rhythm.  Abdomen is soft. Bowel sounds are positive. No hepatomegaly. No abdominal masses felt. No tenderness.  No edema to lower extremities. Stool brown and guaiac negative.     Assessment:   Constipation which has now resolved. BMs are normal.  Hepatitis C diagnosed over 10 yrs ago in [place]. No tx. Undetectable viral load in 2009    Plan:     Will get a PT/INR, Hepatic profile. Hep C antibody, quantitative,CBC,  AFP, OV in 3 months.

## 2012-07-10 ENCOUNTER — Telehealth (INDEPENDENT_AMBULATORY_CARE_PROVIDER_SITE_OTHER): Payer: Self-pay | Admitting: Internal Medicine

## 2012-07-10 DIAGNOSIS — B1920 Unspecified viral hepatitis C without hepatic coma: Secondary | ICD-10-CM

## 2012-07-10 DIAGNOSIS — K739 Chronic hepatitis, unspecified: Secondary | ICD-10-CM

## 2012-07-10 LAB — CBC WITH DIFFERENTIAL/PLATELET
Basophils Absolute: 0.1 10*3/uL (ref 0.0–0.1)
Eosinophils Absolute: 0.4 10*3/uL (ref 0.0–0.7)
Eosinophils Relative: 3 % (ref 0–5)
Lymphs Abs: 3.1 10*3/uL (ref 0.7–4.0)
MCH: 29.9 pg (ref 26.0–34.0)
MCV: 89.3 fL (ref 78.0–100.0)
Neutrophils Relative %: 63 % (ref 43–77)
Platelets: 396 10*3/uL (ref 150–400)
RBC: 4.78 MIL/uL (ref 4.22–5.81)
RDW: 13.5 % (ref 11.5–15.5)
WBC: 12.3 10*3/uL — ABNORMAL HIGH (ref 4.0–10.5)

## 2012-07-10 LAB — HEPATIC FUNCTION PANEL
ALT: 10 U/L (ref 0–53)
Albumin: 4.3 g/dL (ref 3.5–5.2)
Bilirubin, Direct: 0.2 mg/dL (ref 0.0–0.3)
Total Bilirubin: 0.9 mg/dL (ref 0.3–1.2)

## 2012-07-10 NOTE — Telephone Encounter (Signed)
Results given to mother who is power of attorney.  Patient also needs a colonoscopy. Need a genotype   Ann, please schedule a colonoscopy for hx of colonic polyps. Last colonoscopy in 2009

## 2012-07-10 NOTE — Telephone Encounter (Signed)
Lab needs to be done per Delrae Rend

## 2012-07-13 ENCOUNTER — Telehealth (INDEPENDENT_AMBULATORY_CARE_PROVIDER_SITE_OTHER): Payer: Self-pay | Admitting: *Deleted

## 2012-07-13 ENCOUNTER — Other Ambulatory Visit (INDEPENDENT_AMBULATORY_CARE_PROVIDER_SITE_OTHER): Payer: Self-pay | Admitting: *Deleted

## 2012-07-13 DIAGNOSIS — Z1211 Encounter for screening for malignant neoplasm of colon: Secondary | ICD-10-CM

## 2012-07-13 DIAGNOSIS — Z86010 Personal history of colonic polyps: Secondary | ICD-10-CM

## 2012-07-13 LAB — HEPATITIS C RNA QUANTITATIVE: HCV Quantitative: NOT DETECTED IU/mL (ref <15)

## 2012-07-13 NOTE — Telephone Encounter (Signed)
TCS sch'd 07/22/12, patient's mother aware, instructions mailed

## 2012-07-13 NOTE — Telephone Encounter (Signed)
Patient needs movi prep 

## 2012-07-13 NOTE — Telephone Encounter (Signed)
lmom asking patient to give me a call to scheduled TCS

## 2012-07-14 ENCOUNTER — Encounter (HOSPITAL_COMMUNITY): Payer: Self-pay | Admitting: Pharmacy Technician

## 2012-07-14 MED ORDER — PEG-KCL-NACL-NASULF-NA ASC-C 100 G PO SOLR: 1.0000 | Freq: Once | ORAL | Status: DC

## 2012-07-15 ENCOUNTER — Encounter (HOSPITAL_COMMUNITY): Payer: Self-pay | Admitting: Pharmacy Technician

## 2012-07-22 ENCOUNTER — Encounter (HOSPITAL_COMMUNITY): Payer: Self-pay | Admitting: *Deleted

## 2012-07-22 ENCOUNTER — Encounter (HOSPITAL_COMMUNITY)
Admission: RE | Disposition: A | Discharge status: Home / Self Care | Payer: Self-pay | Source: Ambulatory Visit | Attending: Internal Medicine

## 2012-07-22 ENCOUNTER — Ambulatory Visit (HOSPITAL_COMMUNITY)
Admission: RE | Admit: 2012-07-22 | Discharge: 2012-07-22 | Disposition: A | Discharge status: Home / Self Care | Payer: Medicaid Other | Source: Ambulatory Visit | Attending: Internal Medicine | Admitting: Internal Medicine

## 2012-07-22 DIAGNOSIS — Z09 Encounter for follow-up examination after completed treatment for conditions other than malignant neoplasm: Secondary | ICD-10-CM | POA: Insufficient documentation

## 2012-07-22 DIAGNOSIS — Z860100 Personal history of colon polyps, unspecified: Secondary | ICD-10-CM

## 2012-07-22 DIAGNOSIS — K6389 Other specified diseases of intestine: Secondary | ICD-10-CM

## 2012-07-22 DIAGNOSIS — J449 Chronic obstructive pulmonary disease, unspecified: Secondary | ICD-10-CM | POA: Insufficient documentation

## 2012-07-22 DIAGNOSIS — J4489 Other specified chronic obstructive pulmonary disease: Secondary | ICD-10-CM | POA: Insufficient documentation

## 2012-07-22 HISTORY — PX: COLONOSCOPY: SHX5424

## 2012-07-22 SURGERY — COLONOSCOPY
Anesthesia: Moderate Sedation

## 2012-07-22 MED ORDER — MIDAZOLAM HCL 5 MG/5ML IJ SOLN
INTRAMUSCULAR | Status: AC
  Filled 2012-07-22: qty 5

## 2012-07-22 MED ORDER — MEPERIDINE HCL 50 MG/ML IJ SOLN
INTRAMUSCULAR | Status: AC
  Filled 2012-07-22: qty 1

## 2012-07-22 MED ORDER — MEPERIDINE HCL 50 MG/ML IJ SOLN
INTRAMUSCULAR | Status: DC | PRN
  Administered 2012-07-22 (×2): 25 mg via INTRAVENOUS

## 2012-07-22 MED ORDER — MIDAZOLAM HCL 5 MG/5ML IJ SOLN
INTRAMUSCULAR | Status: DC | PRN
  Administered 2012-07-22: 2 mg via INTRAVENOUS
  Administered 2012-07-22: 3 mg via INTRAVENOUS
  Administered 2012-07-22: 2 mg via INTRAVENOUS
  Administered 2012-07-22: 3 mg via INTRAVENOUS

## 2012-07-22 MED ORDER — MIDAZOLAM HCL 5 MG/5ML IJ SOLN
INTRAMUSCULAR | Status: AC
  Filled 2012-07-22: qty 10

## 2012-07-22 MED ORDER — STERILE WATER FOR IRRIGATION IR SOLN
Status: DC | PRN
  Administered 2012-07-22: 12:00:00

## 2012-07-22 MED ORDER — SODIUM CHLORIDE 0.45 % IV SOLN
INTRAVENOUS | Status: DC
  Administered 2012-07-22: 12:00:00 via INTRAVENOUS

## 2012-07-22 NOTE — H&P (Signed)
[name] is an 52 y.o. male.   Chief Complaint: Patient is here for colonoscopy. HPI: Patient is 52 year old Caucasian male with multiple medical problems who is here for surveillance colonoscopy. He has history of colonic adenomas. Adenomas removed in September 2009. Last year he was seen in the office for constipation that he is having normal bowel movements now without any medications. He continues to complain of epigastric pain. He has chronic pancreatitis. He has good appetite. He has gained 10 pounds in the last 6-8 months. He denies rectal bleeding. Family history is negative for colorectal carcinoma.  Past Medical History  Diagnosis Date  . COPD (chronic obstructive pulmonary disease)   . Hypothyroidism   . BPH (benign prostatic hyperplasia)   . Anxiety   . Depression   . Cirrhosis   . Pancreatitis chronic   . Epicondylitis     hips  . Hepatitis C     Past Surgical History  Procedure Date  . Total hip arthroplasty 1 year ago    right hip-[place]  . Appendectomy age 32  . Femur fracture surgery     otif  . Transurethral resection of prostate 05/23/2011    Procedure: TRANSURETHRAL RESECTION OF THE PROSTATE (TURP);  Surgeon: Ky Barban;  Location: [facility];  Service: Urology;  Laterality: N/A;  placement of suprapubic catheter    Family History  Problem Relation Age of Onset  . Anesthesia problems Neg Hx   . Hypotension Neg Hx   . Pseudochol deficiency Neg Hx   . Malignant hyperthermia Neg Hx    Social History:  reports that he has been smoking Cigarettes.  He has a 45 pack-year smoking history. He does not have any smokeless tobacco history on file. He reports that he uses illicit drugs (Marijuana). He reports that he does not drink alcohol.  Allergies:  Allergies  Allergen Reactions  . Penicillins Anaphylaxis  . Vancomycin     redman syndrome  . Codeine Hives    Medications Prior to Admission  Medication Sig Dispense Refill  . buPROPion  (WELLBUTRIN XL) 150 MG 24 hr tablet Take 150 mg by mouth every morning.        . diazepam (VALIUM) 10 MG tablet Take 10 mg by mouth every 8 (eight) hours as needed. For anxiety       . DULoxetine (CYMBALTA) 60 MG capsule Take 60 mg by mouth 2 (two) times daily.        . fentaNYL (DURAGESIC - DOSED MCG/HR) 100 MCG/HR Place 3 patches onto the skin every other day. This dose was verified, it is correct       . levothyroxine (SYNTHROID, LEVOTHROID) 88 MCG tablet Take 88 mcg by mouth daily.      Marland Kitchen lithium carbonate 300 MG capsule Take 300 mg by mouth 3 (three) times daily with meals.        Marland Kitchen oxyCODONE (OXY IR/ROXICODONE) 5 MG immediate release tablet Take 5 mg by mouth every 8 (eight) hours as needed. pain       . peg 3350 powder (MOVIPREP) 100 G SOLR Take 1 kit (100 g total) by mouth once.  1 kit  0  . promethazine (PHENERGAN) 25 MG tablet Take 25 mg by mouth every 6 (six) hours as needed. Nausea and vomiting         No results found for this or any previous visit (from the past 48 hour(s)). No results found.  ROS  Blood pressure 135/80, pulse 88, temperature  98.2 F (36.8 C), temperature source Oral, resp. rate 14, height 5\' 7"  (1.702 m), weight 165 lb (74.844 kg), SpO2 97.00%. Physical Exam  Constitutional: He appears well-developed and well-nourished.  HENT:  Mouth/Throat: Oropharynx is clear and moist.  Eyes: Conjunctivae normal are normal. No scleral icterus.  Neck: No thyromegaly present.  Cardiovascular: Normal rate, regular rhythm and normal heart sounds.   No murmur heard. Respiratory: Effort normal and breath sounds normal.  GI: He exhibits no distension and no mass. There is Tenderness: mild midepigastric tenderness..  Musculoskeletal: He exhibits no edema.  Lymphadenopathy:    He has no cervical adenopathy.  Neurological: He is alert.  Skin: Skin is warm and dry.     Assessment/Plan History of colonic adenomas. Surveillance colonoscopy.  Arnell Slivinski U 07/22/2012,  12:31 PM

## 2012-07-22 NOTE — Op Note (Signed)
COLONOSCOPY PROCEDURE REPORT  PATIENT:  [name]  MR#:  [id number] Birthdate:  [birth year], 52 y.o., male Endoscopist:  Dr. Malissa Hippo, MD Referred By:  Dr. Oneal Deputy. Juanetta Gosling, MD Procedure Date: 07/22/2012  Procedure:   Colonoscopy  Indications: Patient is 52 year old Caucasian male with history of colonic adenomas.  He had two colonic adenomas removed on his last colonoscopy of September 2009.  Informed Consent:  The procedure and risks were reviewed with the patient and informed consent was obtained.  Medications:  Demerol 50mg  IV Versed 10 mg IV  Description of procedure:  After a digital rectal exam was performed, that colonoscope was advanced from the anus through the rectum and colon to the area of the cecum, ileocecal valve and appendiceal orifice. The cecum was deeply intubated. These structures were well-seen and photographed for the record. From the level of the cecum and ileocecal valve, the scope was slowly and cautiously withdrawn. The mucosal surfaces were carefully surveyed utilizing scope tip to flexion to facilitate fold flattening as needed. The scope was pulled down into the rectum where a thorough exam including retroflexion was performed.  Findings:   Prep satisfactory. Normal mucosa of colon and rectum. No evidence of recurrent polyps. Two small anal papillae.  Therapeutic/Diagnostic Maneuvers Performed:  None  Complications:  None  Cecal Withdrawal Time:  11 minutes  Impression:  Two small anal papillae otherwise normal colonoscopy.  Recommendations:  Standard instructions given. Consider next exam in 5 years  REHMAN,NAJEEB U  07/22/2012 1:03 PM  CC: Dr. Fredirick Maudlin, MD & Dr. Bonnetta Barry ref. provider found

## 2012-07-24 ENCOUNTER — Encounter (HOSPITAL_COMMUNITY): Payer: Self-pay | Admitting: Internal Medicine

## 2012-08-30 ENCOUNTER — Emergency Department (HOSPITAL_COMMUNITY): Payer: Medicaid Other

## 2012-08-30 ENCOUNTER — Inpatient Hospital Stay (HOSPITAL_COMMUNITY)
Admission: EM | Admit: 2012-08-30 | Discharge: 2012-09-05 | DRG: 908 | Disposition: A | Discharge status: Home Health | Payer: Medicaid Other | Attending: General Surgery | Admitting: General Surgery

## 2012-08-30 ENCOUNTER — Encounter (HOSPITAL_COMMUNITY): Payer: Self-pay

## 2012-08-30 ENCOUNTER — Inpatient Hospital Stay (HOSPITAL_COMMUNITY): Payer: Medicaid Other

## 2012-08-30 ENCOUNTER — Encounter (HOSPITAL_COMMUNITY): Admission: EM | Disposition: A | Discharge status: Home Health | Payer: Self-pay | Source: Home / Self Care

## 2012-08-30 DIAGNOSIS — Z79899 Other long term (current) drug therapy: Secondary | ICD-10-CM

## 2012-08-30 DIAGNOSIS — B1920 Unspecified viral hepatitis C without hepatic coma: Secondary | ICD-10-CM | POA: Diagnosis present

## 2012-08-30 DIAGNOSIS — F411 Generalized anxiety disorder: Secondary | ICD-10-CM | POA: Diagnosis present

## 2012-08-30 DIAGNOSIS — S32009A Unspecified fracture of unspecified lumbar vertebra, initial encounter for closed fracture: Secondary | ICD-10-CM

## 2012-08-30 DIAGNOSIS — F419 Anxiety disorder, unspecified: Secondary | ICD-10-CM | POA: Insufficient documentation

## 2012-08-30 DIAGNOSIS — F329 Major depressive disorder, single episode, unspecified: Secondary | ICD-10-CM | POA: Diagnosis present

## 2012-08-30 DIAGNOSIS — W19XXXA Unspecified fall, initial encounter: Secondary | ICD-10-CM

## 2012-08-30 DIAGNOSIS — E039 Hypothyroidism, unspecified: Secondary | ICD-10-CM | POA: Diagnosis present

## 2012-08-30 DIAGNOSIS — S2239XA Fracture of one rib, unspecified side, initial encounter for closed fracture: Secondary | ICD-10-CM

## 2012-08-30 DIAGNOSIS — F3289 Other specified depressive episodes: Secondary | ICD-10-CM | POA: Diagnosis present

## 2012-08-30 DIAGNOSIS — Y998 Other external cause status: Secondary | ICD-10-CM

## 2012-08-30 DIAGNOSIS — Z96649 Presence of unspecified artificial hip joint: Secondary | ICD-10-CM

## 2012-08-30 DIAGNOSIS — S82409A Unspecified fracture of shaft of unspecified fibula, initial encounter for closed fracture: Secondary | ICD-10-CM | POA: Diagnosis present

## 2012-08-30 DIAGNOSIS — S82109A Unspecified fracture of upper end of unspecified tibia, initial encounter for closed fracture: Secondary | ICD-10-CM | POA: Diagnosis present

## 2012-08-30 DIAGNOSIS — T79A29A Traumatic compartment syndrome of unspecified lower extremity, initial encounter: Principal | ICD-10-CM

## 2012-08-30 DIAGNOSIS — S2249XA Multiple fractures of ribs, unspecified side, initial encounter for closed fracture: Secondary | ICD-10-CM

## 2012-08-30 DIAGNOSIS — S82202A Unspecified fracture of shaft of left tibia, initial encounter for closed fracture: Secondary | ICD-10-CM

## 2012-08-30 DIAGNOSIS — N400 Enlarged prostate without lower urinary tract symptoms: Secondary | ICD-10-CM | POA: Diagnosis present

## 2012-08-30 DIAGNOSIS — K7460 Unspecified cirrhosis of liver: Secondary | ICD-10-CM | POA: Diagnosis present

## 2012-08-30 DIAGNOSIS — Y92009 Unspecified place in unspecified non-institutional (private) residence as the place of occurrence of the external cause: Secondary | ICD-10-CM

## 2012-08-30 DIAGNOSIS — J4489 Other specified chronic obstructive pulmonary disease: Secondary | ICD-10-CM | POA: Diagnosis present

## 2012-08-30 DIAGNOSIS — W108XXA Fall (on) (from) other stairs and steps, initial encounter: Secondary | ICD-10-CM | POA: Diagnosis present

## 2012-08-30 DIAGNOSIS — T79A0XA Compartment syndrome, unspecified, initial encounter: Secondary | ICD-10-CM

## 2012-08-30 DIAGNOSIS — S82209A Unspecified fracture of shaft of unspecified tibia, initial encounter for closed fracture: Secondary | ICD-10-CM

## 2012-08-30 DIAGNOSIS — J449 Chronic obstructive pulmonary disease, unspecified: Secondary | ICD-10-CM | POA: Diagnosis present

## 2012-08-30 HISTORY — PX: FASCIOTOMY: SHX132

## 2012-08-30 HISTORY — DX: Bipolar disorder, unspecified: F31.9

## 2012-08-30 LAB — CBC WITH DIFFERENTIAL/PLATELET
Basophils Absolute: 0 10*3/uL (ref 0.0–0.1)
Eosinophils Relative: 0 % (ref 0–5)
HCT: 35.6 % — ABNORMAL LOW (ref 39.0–52.0)
Hemoglobin: 12 g/dL — ABNORMAL LOW (ref 13.0–17.0)
Lymphocytes Relative: 5 % — ABNORMAL LOW (ref 12–46)
MCV: 90.6 fL (ref 78.0–100.0)
Monocytes Relative: 7 % (ref 3–12)
Neutro Abs: 24.1 10*3/uL — ABNORMAL HIGH (ref 1.7–7.7)
RBC: 3.93 MIL/uL — ABNORMAL LOW (ref 4.22–5.81)
RDW: 12.4 % (ref 11.5–15.5)
WBC: 27.4 10*3/uL — ABNORMAL HIGH (ref 4.0–10.5)

## 2012-08-30 LAB — COMPREHENSIVE METABOLIC PANEL
AST: 26 U/L (ref 0–37)
Albumin: 3.8 g/dL (ref 3.5–5.2)
Alkaline Phosphatase: 87 U/L (ref 39–117)
BUN: 6 mg/dL (ref 6–23)
CO2: 24 mEq/L (ref 19–32)
Chloride: 96 mEq/L (ref 96–112)
GFR calc non Af Amer: >90 mL/min (ref >90)
Potassium: 3.6 mEq/L (ref 3.5–5.1)
Total Bilirubin: 0.9 mg/dL (ref 0.3–1.2)

## 2012-08-30 SURGERY — FASCIOTOMY, UPPER EXTREMITY
Anesthesia: Choice | Site: Leg Lower | Laterality: Left | Wound class: Clean

## 2012-08-30 MED ORDER — LORAZEPAM 1 MG PO TABS: 1.0000 mg | ORAL_TABLET | Freq: Four times a day (QID) | ORAL | Status: DC | PRN

## 2012-08-30 MED ORDER — MAGNESIUM HYDROXIDE 400 MG/5ML PO SUSP: 30.0000 mL | Freq: Two times a day (BID) | ORAL | Status: DC | PRN

## 2012-08-30 MED ORDER — KCL IN DEXTROSE-NACL 40-5-0.9 MEQ/L-%-% IV SOLN
INTRAVENOUS | Status: DC
  Administered 2012-08-31: 1000 mL via INTRAVENOUS
  Filled 2012-08-30 (×3): qty 1000

## 2012-08-30 MED ORDER — ADULT MULTIVITAMIN W/MINERALS CH
1.0000 | ORAL_TABLET | Freq: Every day | ORAL | Status: DC
  Administered 2012-08-31: 1 via ORAL
  Filled 2012-08-30: qty 1

## 2012-08-30 MED ORDER — FOLIC ACID 1 MG PO TABS
1.0000 mg | ORAL_TABLET | Freq: Every day | ORAL | Status: DC
  Administered 2012-08-31 – 2012-09-05 (×6): 1 mg via ORAL
  Filled 2012-08-30 (×6): qty 1

## 2012-08-30 MED ORDER — DULOXETINE HCL 60 MG PO CPEP
60.0000 mg | ORAL_CAPSULE | Freq: Two times a day (BID) | ORAL | Status: DC
  Administered 2012-08-31 – 2012-09-02 (×5): 60 mg via ORAL
  Filled 2012-08-30 (×7): qty 1

## 2012-08-30 MED ORDER — FENTANYL CITRATE 0.05 MG/ML IJ SOLN
25.0000 ug | INTRAMUSCULAR | Status: DC | PRN
Start: 2012-08-30 — End: 2012-08-31
  Administered 2012-08-31 (×3): 50 ug via INTRAVENOUS
  Filled 2012-08-30 (×2): qty 2

## 2012-08-30 MED ORDER — LEVOTHYROXINE SODIUM 88 MCG PO TABS
88.0000 ug | ORAL_TABLET | Freq: Every day | ORAL | Status: DC
  Administered 2012-08-31 – 2012-09-05 (×6): 88 ug via ORAL
  Filled 2012-08-30 (×9): qty 1

## 2012-08-30 MED ORDER — HYDROMORPHONE HCL PF 1 MG/ML IJ SOLN
1.0000 mg | Freq: Once | INTRAMUSCULAR | Status: AC
  Administered 2012-08-30: 1 mg via INTRAVENOUS
  Filled 2012-08-30: qty 1

## 2012-08-30 MED ORDER — IOHEXOL 300 MG/ML  SOLN
100.0000 mL | Freq: Once | INTRAMUSCULAR | Status: AC | PRN
  Administered 2012-08-30: 100 mL via INTRAVENOUS

## 2012-08-30 MED ORDER — PROMETHAZINE HCL 25 MG/ML IJ SOLN: 12.5000 mg | Freq: Four times a day (QID) | INTRAMUSCULAR | Status: DC | PRN

## 2012-08-30 MED ORDER — LACTATED RINGERS IV BOLUS (SEPSIS): 1000.0000 mL | Freq: Three times a day (TID) | INTRAVENOUS | Status: DC | PRN

## 2012-08-30 MED ORDER — BISACODYL 10 MG RE SUPP: 10.0000 mg | Freq: Two times a day (BID) | RECTAL | Status: DC | PRN

## 2012-08-30 MED ORDER — THIAMINE HCL 100 MG/ML IJ SOLN
100.0000 mg | Freq: Every day | INTRAMUSCULAR | Status: DC
  Filled 2012-08-30: qty 1

## 2012-08-30 MED ORDER — FENTANYL 50 MCG/HR TD PT72
100.0000 ug | MEDICATED_PATCH | TRANSDERMAL | Status: DC
  Administered 2012-08-31: 100 ug via TRANSDERMAL
  Filled 2012-08-30: qty 1

## 2012-08-30 MED ORDER — DIPHENHYDRAMINE HCL 50 MG/ML IJ SOLN
12.5000 mg | Freq: Four times a day (QID) | INTRAMUSCULAR | Status: DC | PRN
  Administered 2012-09-01: 25 mg via INTRAVENOUS
  Filled 2012-08-30: qty 1

## 2012-08-30 MED ORDER — ONDANSETRON HCL 4 MG/2ML IJ SOLN
4.0000 mg | INTRAMUSCULAR | Status: AC
  Administered 2012-08-30: 4 mg via INTRAVENOUS
  Filled 2012-08-30: qty 2

## 2012-08-30 MED ORDER — LITHIUM CARBONATE 300 MG PO CAPS
300.0000 mg | ORAL_CAPSULE | Freq: Three times a day (TID) | ORAL | Status: DC
  Administered 2012-08-31 – 2012-09-02 (×7): 300 mg via ORAL
  Filled 2012-08-30 (×14): qty 1

## 2012-08-30 MED ORDER — HYDROMORPHONE HCL PF 1 MG/ML IJ SOLN: 1.0000 mg | Freq: Once | INTRAMUSCULAR | Status: DC

## 2012-08-30 MED ORDER — ENOXAPARIN SODIUM 40 MG/0.4ML ~~LOC~~ SOLN: 40.0000 mg | SUBCUTANEOUS | Status: DC

## 2012-08-30 MED ORDER — BUPROPION HCL ER (XL) 150 MG PO TB24
150.0000 mg | ORAL_TABLET | Freq: Every day | ORAL | Status: DC
  Administered 2012-08-31 – 2012-09-02 (×3): 150 mg via ORAL
  Filled 2012-08-30 (×3): qty 1

## 2012-08-30 MED ORDER — PSYLLIUM 95 % PO PACK
1.0000 | PACK | Freq: Two times a day (BID) | ORAL | Status: DC
  Administered 2012-08-31 – 2012-09-02 (×4): via ORAL
  Administered 2012-09-02: 1 via ORAL
  Administered 2012-09-03: 20:00:00 via ORAL
  Administered 2012-09-03 – 2012-09-05 (×4): 1 via ORAL
  Filled 2012-08-30 (×13): qty 1

## 2012-08-30 MED ORDER — METOPROLOL TARTRATE 12.5 MG HALF TABLET
12.5000 mg | ORAL_TABLET | Freq: Two times a day (BID) | ORAL | Status: DC | PRN
  Filled 2012-08-30: qty 0.5

## 2012-08-30 MED ORDER — OXYCODONE HCL 5 MG PO TABS: 5.0000 mg | ORAL_TABLET | Freq: Three times a day (TID) | ORAL | Status: DC | PRN

## 2012-08-30 MED ORDER — ALUM & MAG HYDROXIDE-SIMETH 200-200-20 MG/5ML PO SUSP: 30.0000 mL | Freq: Four times a day (QID) | ORAL | Status: DC | PRN

## 2012-08-30 MED ORDER — LIP MEDEX EX OINT
1.0000 "application " | TOPICAL_OINTMENT | Freq: Two times a day (BID) | CUTANEOUS | Status: DC
  Filled 2012-08-30: qty 7

## 2012-08-30 MED ORDER — MAGIC MOUTHWASH
15.0000 mL | Freq: Four times a day (QID) | ORAL | Status: DC | PRN
  Filled 2012-08-30: qty 15

## 2012-08-30 MED ORDER — VITAMIN B-1 100 MG PO TABS
100.0000 mg | ORAL_TABLET | Freq: Every day | ORAL | Status: DC
  Administered 2012-08-31: 100 mg via ORAL
  Filled 2012-08-30: qty 1

## 2012-08-30 MED ORDER — PROMETHAZINE HCL 25 MG PO TABS: 12.5000 mg | ORAL_TABLET | Freq: Four times a day (QID) | ORAL | Status: DC | PRN

## 2012-08-30 MED ORDER — ONDANSETRON HCL 4 MG/2ML IJ SOLN: 4.0000 mg | Freq: Four times a day (QID) | INTRAMUSCULAR | Status: DC | PRN

## 2012-08-30 MED ORDER — MORPHINE SULFATE 4 MG/ML IJ SOLN
4.0000 mg | Freq: Once | INTRAMUSCULAR | Status: AC
  Administered 2012-08-30: 4 mg via INTRAVENOUS
  Filled 2012-08-30: qty 1

## 2012-08-30 MED ORDER — LORAZEPAM 2 MG/ML IJ SOLN
1.0000 mg | Freq: Four times a day (QID) | INTRAMUSCULAR | Status: DC | PRN
  Administered 2012-08-31: 1 mg via INTRAVENOUS
  Filled 2012-08-30: qty 1

## 2012-08-30 SURGICAL SUPPLY — 16 items
BANDAGE ELASTIC 6 VELCRO ST LF (GAUZE/BANDAGES/DRESSINGS) ×4 IMPLANT
DRAPE U-SHAPE 47X51 STRL (DRAPES) ×4 IMPLANT
DRSG MEPILEX BORDER 4X12 (GAUZE/BANDAGES/DRESSINGS) ×2 IMPLANT
DRSG MEPILEX BORDER 4X8 (GAUZE/BANDAGES/DRESSINGS) ×2 IMPLANT
DURAPREP 26ML APPLICATOR (WOUND CARE) ×2 IMPLANT
ELECT REM PT RETURN 9FT ADLT (ELECTROSURGICAL) ×3
ELECTRODE REM PT RTRN 9FT ADLT (ELECTROSURGICAL) ×1 IMPLANT
GAUZE XEROFORM 2X2 STRL (GAUZE/BANDAGES/DRESSINGS) ×2 IMPLANT
GAUZE XEROFORM 5X9 LF (GAUZE/BANDAGES/DRESSINGS) ×4 IMPLANT
GOWN STRL REIN XL XLG (GOWN DISPOSABLE) ×6 IMPLANT
KIT BASIN OR (CUSTOM PROCEDURE TRAY) ×2 IMPLANT
MANIFOLD NEPTUNE II (INSTRUMENTS) ×2 IMPLANT
PACK LOWER EXTREMITY WL (CUSTOM PROCEDURE TRAY) ×2 IMPLANT
STOCKINETTE 8 INCH (MISCELLANEOUS) ×2 IMPLANT
TOWEL OR 17X26 10 PK STRL BLUE (TOWEL DISPOSABLE) ×2 IMPLANT
WATER STERILE IRR 1500ML POUR (IV SOLUTION) ×2 IMPLANT

## 2012-08-30 NOTE — H&P (Addendum)
[name]  1960/08/01 [id number]  CARE TEAM:  PCP: Fredirick Maudlin, MD  Outpatient Care Team: Patient Care Team: Fredirick Maudlin, MD as PCP - General (Internal Medicine)  Inpatient Treatment Team: Treatment Team: Attending Provider: Glynn Octave, MD; Registered Nurse: Dwyane Luo, RN; Consulting Physician: Trauma Md, MD  This patient is a 52 y.o.male who presents today for surgical evaluation at the request of Dr. Manus Gunning, [facility] ED.   Reason for evaluation: Fall with rib lumbar and tib/fib fractures  Middle-age man with bipolar disorder and depression.  Chronic pain.  On chronic fentanyl patches (300 mcg a day), oxycodone, Valium, antidepressants.  Found at the bottom the stairs crumpled and bent over.  Moaning in pain.  Brought by EMS to [facility] ED.  Initially billed as knee pain so workup delayed.  Once evaluated seen as a trauma.  CT of head neck negative.  Evidence of an old left clavicle fracture.  A few small rib fractures.  Complaining a lot of left knee and leg pain.  Pain getting much more intense and more swollen left side.  Orthopedic consultation..  Concern for compartment syndrome confirmed.  Asked to evaluate the patient further issues.  Patient's wife is extremely hard of hearing.  Patient daughter with her.  Mother takes care of him.  Patient has history of falls in the past.  They deny any drug use.  He used to be a heavy drinker but has not drank in a while.  Cirrhosis and hepatitis C positive.  COPD as well.  Past Medical History  Diagnosis Date  . COPD (chronic obstructive pulmonary disease)   . Hypothyroidism   . BPH (benign prostatic hyperplasia)   . Anxiety   . Depression   . Cirrhosis   . Pancreatitis chronic   . Epicondylitis     hips  . Hepatitis C     Past Surgical History  Procedure Laterality Date  . Total hip arthroplasty  1 year ago    right hip-[place]  . Appendectomy  age 60  . Femur fracture surgery      otif  . Transurethral  resection of prostate  05/23/2011    Procedure: TRANSURETHRAL RESECTION OF THE PROSTATE (TURP);  Surgeon: Ky Barban;  Location: [facility] ORS;  Service: Urology;  Laterality: N/A;  placement of suprapubic catheter  . Colonoscopy  07/22/2012    Procedure: COLONOSCOPY;  Surgeon: Malissa Hippo, MD;  Location: [facility] ENDO SUITE;  Service: Endoscopy;  Laterality: N/A;  225-moved to 200 Ann notified pt    History   Social History  . Marital Status: Divorced    Spouse Name: N/A    Number of Children: N/A  . Years of Education: N/A   Occupational History  . Not on file.   Social History Main Topics  . Smoking status: Current Every Day Smoker -- 1.50 packs/day for 30 years    Types: Cigarettes  . Smokeless tobacco: Not on file  . Alcohol Use: No     Comment: stopped 2003  . Drug Use: Yes    Special: Marijuana     Comment: stopped 1 year ago  . Sexually Active: Not on file   Other Topics Concern  . Not on file   Social History Narrative  . No narrative on file    Family History  Problem Relation Age of Onset  . Anesthesia problems Neg Hx   . Hypotension Neg Hx   . Pseudochol deficiency Neg Hx   .  Malignant hyperthermia Neg Hx     Current Facility-Administered Medications  Medication Dose Route Frequency Provider Last Rate Last Dose  . alum & mag hydroxide-simeth (MAALOX/MYLANTA) 200-200-20 MG/5ML suspension 30 mL  30 mL Oral Q6H PRN Ardeth Sportsman, MD      . bisacodyl (DULCOLAX) suppository 10 mg  10 mg Rectal Q12H PRN Ardeth Sportsman, MD      . Melene Muller ON 08/31/2012] buPROPion (WELLBUTRIN XL) 24 hr tablet 150 mg  150 mg Oral BH-q7a Ardeth Sportsman, MD      . dextrose 5 % and 0.9 % NaCl with KCl 40 mEq/L infusion   Intravenous Continuous Ardeth Sportsman, MD      . diphenhydrAMINE (BENADRYL) injection 12.5-25 mg  12.5-25 mg Intravenous Q6H PRN Ardeth Sportsman, MD      . DULoxetine (CYMBALTA) DR capsule 60 mg  60 mg Oral BID Ardeth Sportsman, MD      . Melene Muller ON 08/31/2012]  enoxaparin (LOVENOX) injection 40 mg  40 mg Subcutaneous Q24H Ardeth Sportsman, MD      . fentaNYL (DURAGESIC - dosed mcg/hr) 100 mcg  100 mcg Transdermal Q48H Ardeth Sportsman, MD      . fentaNYL (SUBLIMAZE) injection 25-50 mcg  25-50 mcg Intravenous Q1H PRN Ardeth Sportsman, MD      . Melene Muller ON 08/31/2012] folic acid (FOLVITE) tablet 1 mg  1 mg Oral Daily Ardeth Sportsman, MD      . HYDROmorphone (DILAUDID) injection 1 mg  1 mg Intravenous Once Glynn Octave, MD      . lactated ringers bolus 1,000 mL  1,000 mL Intravenous Q8H PRN Ardeth Sportsman, MD      . Melene Muller ON 08/31/2012] levothyroxine (SYNTHROID, LEVOTHROID) tablet 88 mcg  88 mcg Oral Daily Ardeth Sportsman, MD      . lip balm (CARMEX) ointment 1 application  1 application Topical BID Ardeth Sportsman, MD      . Melene Muller ON 08/31/2012] lithium carbonate capsule 300 mg  300 mg Oral TID WC Ardeth Sportsman, MD      . LORazepam (ATIVAN) tablet 1 mg  1 mg Oral Q6H PRN Ardeth Sportsman, MD       Or  . LORazepam (ATIVAN) injection 1 mg  1 mg Intravenous Q6H PRN Ardeth Sportsman, MD      . magic mouthwash  15 mL Oral QID PRN Ardeth Sportsman, MD      . magnesium hydroxide (MILK OF MAGNESIA) suspension 30 mL  30 mL Oral Q12H PRN Ardeth Sportsman, MD      . metoprolol tartrate (LOPRESSOR) tablet 12.5 mg  12.5 mg Oral Q12H PRN Ardeth Sportsman, MD      . Melene Muller ON 08/31/2012] multivitamin with minerals tablet 1 tablet  1 tablet Oral Daily Ardeth Sportsman, MD      . ondansetron [employer] [facility]) injection 4-8 mg  4-8 mg Intravenous Q6H PRN Ardeth Sportsman, MD      . oxyCODONE (Oxy IR/ROXICODONE) immediate release tablet 5 mg  5 mg Oral Q8H PRN Ardeth Sportsman, MD      . promethazine (PHENERGAN) injection 12.5-25 mg  12.5-25 mg Intravenous Q6H PRN Ardeth Sportsman, MD      . promethazine (PHENERGAN) tablet 12.5-25 mg  12.5-25 mg Oral Q6H PRN Ardeth Sportsman, MD      . psyllium (HYDROCIL/METAMUCIL) packet 1 packet  1 packet Oral BID Ardeth Sportsman, MD      . [  START ON  08/31/2012] thiamine (VITAMIN B-1) tablet 100 mg  100 mg Oral Daily Ardeth Sportsman, MD       Or  . Melene Muller ON 08/31/2012] thiamine (B-1) injection 100 mg  100 mg Intravenous Daily Ardeth Sportsman, MD       Current Outpatient Prescriptions  Medication Sig Dispense Refill  . buPROPion (WELLBUTRIN XL) 150 MG 24 hr tablet Take 150 mg by mouth every morning.        . diazepam (VALIUM) 10 MG tablet Take 10 mg by mouth every 8 (eight) hours as needed. For anxiety       . DULoxetine (CYMBALTA) 60 MG capsule Take 60 mg by mouth 2 (two) times daily.        . fentaNYL (DURAGESIC - DOSED MCG/HR) 100 MCG/HR Place 3 patches onto the skin every other day. This dose was verified, it is correct       . levothyroxine (SYNTHROID, LEVOTHROID) 88 MCG tablet Take 88 mcg by mouth daily.      Marland Kitchen lithium carbonate 300 MG capsule Take 300 mg by mouth 3 (three) times daily with meals.        Marland Kitchen oxyCODONE (OXY IR/ROXICODONE) 5 MG immediate release tablet Take 5 mg by mouth every 8 (eight) hours as needed. pain       . promethazine (PHENERGAN) 25 MG tablet Take 25 mg by mouth every 6 (six) hours as needed. Nausea and vomiting          Allergies  Allergen Reactions  . Penicillins Anaphylaxis  . Vancomycin     redman syndrome  . Codeine Hives    ROS: Constitutional:  No fevers, chills, sweats.  Weight stable Eyes:  No vision changes, No discharge HENT:  No sore throats, nasal drainage Lymph: No neck swelling, No bruising easily Pulmonary:  No cough, productive sputum CV: No orthopnea, PND  Patient walks 10 minutes for about 1/4 miles without difficulty.  No exertional chest/neck/shoulder/arm pain. GI: No personal nor family history of GI/colon cancer, inflammatory bowel disease, irritable bowel syndrome, allergy such as Celiac Sprue, dietary/dairy problems, colitis, ulcers nor gastritis.  No recent sick contacts/gastroenteritis.  No travel outside the country.  No changes in diet. Renal: No UTIs, No hematuria Genital:   No drainage, bleeding, masses Musculoskeletal: No severe joint pain.  Good ROM major joints Skin:  No sores or lesions.  No rashes Heme/Lymph:  +Easy bleeding.  No swollen lymph nodes Neuro: No focal weakness/numbness.  No seizures Psych: No suicidal ideation.  +?hallucinations  BP 154/94  Pulse 94  Temp(Src) 97.7 F (36.5 C) (Oral)  Ht 5\' 7"  (1.702 m)  Wt 160 lb (72.576 kg)  BMI 25.05 kg/m2  SpO2 100%  Physical Exam: General: Pt awakens oriented x3 in mild acute distress.  Thin cachectic Eyes: PERRL, normal EOM. Sclera nonicteric Neuro: CN II-XII intact w/o focal sensory/motor deficits. Lymph: No head/neck/groin lymphadenopathy Psych:  No delerium/paranoia.  Seems rather confused.  Not fully oriented.  Then becomes more clear.  Acting groggy and over narcotized HENT: Normocephalic, Mucus membranes moist.  No thrush Neck: Supple, No tracheal deviation Chest: Tenderness to palpation on right posterior chest wall.  Otherwise no pain.  Good respiratory excursion.  No absent breath sounds at the CV:  Pulses intact.  Regular rhythm Abdomen: Soft, Nondistended.  Nontender.  No incarcerated hernias. Ext:  Obvious edema of left leg twice the size of right.  Ecchymosis as well.  Pressure readings in the 90s. Skin: No petechiae /  purpurea.  No major sores Musculoskeletal: No severe joint pain.  Good ROM major joints.  Mild lumbar pain to palpation.   Results:   Labs: Results for orders placed during the hospital encounter of 08/30/12 (from the past 48 hour(s))  CBC WITH DIFFERENTIAL     Status: Abnormal   Collection Time    08/30/12  7:38 PM      Result Value Range   WBC 27.4 (*) 4.0 - 10.5 K/uL   RBC 3.93 (*) 4.22 - 5.81 MIL/uL   Hemoglobin 12.0 (*) 13.0 - 17.0 g/dL   HCT 16.1 (*) 09.6 - 04.5 %   MCV 90.6  78.0 - 100.0 fL   MCH 30.5  26.0 - 34.0 pg   MCHC 33.7  30.0 - 36.0 g/dL   RDW 40.9  81.1 - 91.4 %   Platelets 343  150 - 400 K/uL   Neutrophils Relative 88 (*) 43 - 77 %    Lymphocytes Relative 5 (*) 12 - 46 %   Monocytes Relative 7  3 - 12 %   Eosinophils Relative 0  0 - 5 %   Basophils Relative 0  0 - 1 %   Neutro Abs 24.1 (*) 1.7 - 7.7 K/uL   Lymphs Abs 1.4  0.7 - 4.0 K/uL   Monocytes Absolute 1.9 (*) 0.1 - 1.0 K/uL   Eosinophils Absolute 0.0  0.0 - 0.7 K/uL   Basophils Absolute 0.0  0.0 - 0.1 K/uL   Smear Review MORPHOLOGY UNREMARKABLE    COMPREHENSIVE METABOLIC PANEL     Status: Abnormal   Collection Time    08/30/12  7:38 PM      Result Value Range   Sodium 132 (*) 135 - 145 mEq/L   Potassium 3.6  3.5 - 5.1 mEq/L   Chloride 96  96 - 112 mEq/L   CO2 24  19 - 32 mEq/L   Glucose, Bld 169 (*) 70 - 99 mg/dL   BUN 6  6 - 23 mg/dL   Creatinine, Ser 7.82  0.50 - 1.35 mg/dL   Calcium 8.9  8.4 - 95.6 mg/dL   Total Protein 7.5  6.0 - 8.3 g/dL   Albumin 3.8  3.5 - 5.2 g/dL   AST 26  0 - 37 U/L   ALT 16  0 - 53 U/L   Alkaline Phosphatase 87  39 - 117 U/L   Total Bilirubin 0.9  0.3 - 1.2 mg/dL   GFR calc non Af Amer >90  >90 mL/min   GFR calc Af Amer >90  >90 mL/min   Comment:            The eGFR has been calculated     using the CKD EPI equation.     This calculation has not been     validated in all clinical     situations.     eGFR's persistently     <90 mL/min signify     possible Chronic Kidney Disease.  PROTIME-INR     Status: None   Collection Time    08/30/12  7:38 PM      Result Value Range   Prothrombin Time 12.9  11.6 - 15.2 seconds   INR 0.98  0.00 - 1.49    Imaging / Studies: Dg Femur Left  08/30/2012  *RADIOLOGY REPORT*  Clinical Data: Left leg pain post fall  LEFT FEMUR - 2 VIEW  Comparison: None  Findings: Osseous demineralization. Mild narrowing and knee joint. Knee joint  effusion present. Questionable lucency at the lateral tibial plateau, please refer to dedicated knee radiographs. Femur appears intact.  IMPRESSION: No left femoral fracture identified. Left knee joint effusion with question lucency at the lateral tibial  plateau, please refer to left knee radiographs.   Original Report Authenticated By: Ulyses Southward, M.D.    Dg Knee 1-2 Views Left  08/30/2012  *RADIOLOGY REPORT*  Clinical Data: Left leg pain post fall  LEFT KNEE - 1-2 VIEW  Comparison: None  Findings: Osseous demineralization. Comminuted fracture proximal tibial metaphysis suspect extending into the lateral compartment joint space. Additional displaced fracture of the left fibular neck. Visualized femur appears intact. Knee joint effusion present. No patellar fracture identified.  IMPRESSION: Displaced proximal left tibial and fibular fractures as above with associated knee joint effusion.   Original Report Authenticated By: Ulyses Southward, M.D.    Dg Tibia/fibula Left  08/30/2012  *RADIOLOGY REPORT*  Clinical Data: Left leg pain post fall  LEFT TIBIA AND FIBULA - 2 VIEW  Comparison: None  Findings: Diffuse osseous demineralization. Comminuted proximal left tibial metaphyseal fracture with intra- articular extension at lateral compartment. Displaced left fibular neck fracture. Remainder of tibia and fibula appear intact. Ankle joint alignment normal. Soft tissues unremarkable.  IMPRESSION: Displaced proximal left tibial and fibular fractures as above.   Original Report Authenticated By: Ulyses Southward, M.D.    Ct Head Wo Contrast  08/30/2012  *RADIOLOGY REPORT*  Clinical Data:  Fall, neck pain  CT HEAD WITHOUT CONTRAST CT CERVICAL SPINE WITHOUT CONTRAST  Technique:  Multidetector CT imaging of the head and cervical spine was performed following the standard protocol without intravenous contrast.  Multiplanar CT image reconstructions of the cervical spine were also generated.  Comparison:  None  CT HEAD  Findings: Generalized atrophy. Normal ventricle morphology. No midline shift or mass effect. Scattered motion artifacts despite repeating images. Within limitations of motion, no gross intracranial hemorrhage, mass lesion or evidence of acute infarction identified. Bones  and sinuses grossly unremarkable.  IMPRESSION: No definite acute intracranial abnormalities identified on exam limited by motion.  CT CERVICAL SPINE  Findings: Prevertebral soft tissues normal thickness. Vertebral body and disc space heights maintained. No acute fracture, subluxation or bone destruction. Visualized skull base intact. Few blebs at right apex.  IMPRESSION: No acute cervical spine abnormalities.   Original Report Authenticated By: Ulyses Southward, M.D.    Ct Chest W Contrast  08/30/2012  *RADIOLOGY REPORT*  Clinical Data:  Fall  CT CHEST, ABDOMEN AND PELVIS WITH CONTRAST  Technique:  Multidetector CT imaging of the chest, abdomen and pelvis was performed following the standard protocol during bolus administration of intravenous contrast.  Contrast: OMNIPAQUE IOHEXOL 300 MG/ML  SOLN  Comparison:  07/10/2010 abdominal CT  CT CHEST  Findings:  Mild ectasia of the ascending aorta, tapers along the arch to a normal caliber.  No anterior mediastinal hematoma.  Normal heart size.  No pleural or pericardial effusion.  No intrathoracic lymphadenopathy.  Fractured distal left clavicle on image 1 is incompletely imaged and age indeterminate. Fractures of the head of the right eleventh and twelfth ribs appear acute.  Mild emphysema and apical bulla.  Central airways are patent.  No pneumothorax.  Mild dependent atelectasis.  IMPRESSION: Distal left clavicle fracture is incompletely imaged though favored remote.  Correlate with point tenderness.  Fractures of the head of the right eleventh and twelfth ribs.  Otherwise, no acute process within the chest.  CT ABDOMEN AND PELVIS  Findings:  Unremarkable liver.  Absent gallbladder.  Minimal biliary ductal prominence post cholecystectomy is nonspecific. Unremarkable spleen, pancreas, adrenal glands.  Symmetric renal enhancement.  Subcentimeter hypodensity upper pole left kidney, incompletely characterized.  No hydronephrosis or hydroureter.  No CT evidence for  colitis.  Appendix not identified.  No right lower quadrant inflammation.  Small bowel loops are normal course and caliber.  No free intraperitoneal air or fluid.  No lymphadenopathy.  There is scattered atherosclerotic calcification of the aorta and its branches. No aneurysmal dilatation.  Thin-walled bladder.  Right hip arthroplasty.  Fractures of the right to L2 and L3 transverse processes.  IMPRESSION: Fractures of the right L2 and L3 transverse processes.  Otherwise, no acute abdominopelvic process identified.   Original Report Authenticated By: Jearld Lesch, M.D.    Ct Cervical Spine Wo Contrast  08/30/2012  *RADIOLOGY REPORT*  Clinical Data:  Fall, neck pain  CT HEAD WITHOUT CONTRAST CT CERVICAL SPINE WITHOUT CONTRAST  Technique:  Multidetector CT imaging of the head and cervical spine was performed following the standard protocol without intravenous contrast.  Multiplanar CT image reconstructions of the cervical spine were also generated.  Comparison:  None  CT HEAD  Findings: Generalized atrophy. Normal ventricle morphology. No midline shift or mass effect. Scattered motion artifacts despite repeating images. Within limitations of motion, no gross intracranial hemorrhage, mass lesion or evidence of acute infarction identified. Bones and sinuses grossly unremarkable.  IMPRESSION: No definite acute intracranial abnormalities identified on exam limited by motion.  CT CERVICAL SPINE  Findings: Prevertebral soft tissues normal thickness. Vertebral body and disc space heights maintained. No acute fracture, subluxation or bone destruction. Visualized skull base intact. Few blebs at right apex.  IMPRESSION: No acute cervical spine abnormalities.   Original Report Authenticated By: Ulyses Southward, M.D.    Ct Abdomen Pelvis W Contrast  08/30/2012  *RADIOLOGY REPORT*  Clinical Data:  Fall  CT CHEST, ABDOMEN AND PELVIS WITH CONTRAST  Technique:  Multidetector CT imaging of the chest, abdomen and pelvis was  performed following the standard protocol during bolus administration of intravenous contrast.  Contrast: OMNIPAQUE IOHEXOL 300 MG/ML  SOLN  Comparison:  07/10/2010 abdominal CT  CT CHEST  Findings:  Mild ectasia of the ascending aorta, tapers along the arch to a normal caliber.  No anterior mediastinal hematoma.  Normal heart size.  No pleural or pericardial effusion.  No intrathoracic lymphadenopathy.  Fractured distal left clavicle on image 1 is incompletely imaged and age indeterminate. Fractures of the head of the right eleventh and twelfth ribs appear acute.  Mild emphysema and apical bulla.  Central airways are patent.  No pneumothorax.  Mild dependent atelectasis.  IMPRESSION: Distal left clavicle fracture is incompletely imaged though favored remote.  Correlate with point tenderness.  Fractures of the head of the right eleventh and twelfth ribs.  Otherwise, no acute process within the chest.  CT ABDOMEN AND PELVIS  Findings:  Unremarkable liver.  Absent gallbladder.  Minimal biliary ductal prominence post cholecystectomy is nonspecific. Unremarkable spleen, pancreas, adrenal glands.  Symmetric renal enhancement.  Subcentimeter hypodensity upper pole left kidney, incompletely characterized.  No hydronephrosis or hydroureter.  No CT evidence for colitis.  Appendix not identified.  No right lower quadrant inflammation.  Small bowel loops are normal course and caliber.  No free intraperitoneal air or fluid.  No lymphadenopathy.  There is scattered atherosclerotic calcification of the aorta and its branches. No aneurysmal dilatation.  Thin-walled bladder.  Right hip arthroplasty.  Fractures of the right to L2  and L3 transverse processes.  IMPRESSION: Fractures of the right L2 and L3 transverse processes.  Otherwise, no acute abdominopelvic process identified.   Original Report Authenticated By: Jearld Lesch, M.D.     Medications / Allergies: per chart  Antibiotics: Anti-infectives   None       Assessment  [name]  52 y.o. male       Problem List:  Active Problems:   Cirrhosis   Hepatitis C   Falls with rib fractures, lumbar fractures, significant tubular/fibular fracture with compartment syndrome  Plan:  Admit to trauma  Step down to monitor for fractures rule out pneumonia  Patient requires emergent decompression of his compartment syndrome of his left lower extremity.  At some point will need repair of his fractures as well.  Dr. Jillyn Hidden with orthopedics are evaluating and trying to get an operating room and I cannot take care of him.  Hold off on narcotics to much less dose for now until MS improves.  Benzodiazepine/alcohol withdrawal protocol.  Patient needs to back off on his pain medications to avoid further incidents of this.  Followup with his chronic pain specialist/primary care physician.  Recommendations made to patient & family.  Numerous questions answered.  They expressed understanding and appreciation.  -VTE prophylaxis- SCDs, etc -mobilize as tolerated to help recovery    Ardeth Sportsman, M.D., F.A.C.S. [facility] [address], [address] [place], [place] [address] [phone] Main / Paging [phone] Voice Mail   08/30/2012

## 2012-08-30 NOTE — ED Notes (Signed)
Pt resides [place].  Pt tripped in house yesterday hurting left knee.  Pt transported here to [facility].  For evalaution of left knee.  Pt denies any other complaints at this time.  Pt admits to wearing fentanyl patches.

## 2012-08-30 NOTE — ED Notes (Signed)
PT HAS (3) FENTANYL 100 MCG PATCH PLACED THIS AM ON HIS BODY.  VERIFIED BY THIS WRITER. One on his chest and two on his upper back.

## 2012-08-30 NOTE — ED Provider Notes (Addendum)
History     CSN: [id number]  Arrival date & time 08/30/12  1620   First MD Initiated Contact with Patient 08/30/12 1805      Chief Complaint  Patient presents with  . Fall  . Knee Pain    left    (Consider location/radiation/quality/duration/timing/severity/associated sxs/prior treatment) HPI Comments: Patient with hx of bipolar d/o, liver cirrhosis, chronic pancreatitis, and COPD presents after falling down 13 carpeted steps at his mothers house this afternoon. Patient states he has pain from his mid left thigh down his L leg. Patient states he tripped which caused the fall. Patient not ambulatory after accident. Patient is a poor historian limiting the rest of his HPI.  The history is provided by the patient, a relative and a parent. History limited by: patient is a poor historian 2/2 psych history and mental state; patient is A&O x 3. No language interpreter was used.    Past Medical History  Diagnosis Date  . COPD (chronic obstructive pulmonary disease)   . Hypothyroidism   . BPH (benign prostatic hyperplasia)   . Anxiety   . Depression   . Cirrhosis   . Pancreatitis chronic   . Epicondylitis     hips  . Hepatitis C     Past Surgical History  Procedure Laterality Date  . Total hip arthroplasty  1 year ago    right hip-[place]  . Appendectomy  age 28  . Femur fracture surgery      otif  . Transurethral resection of prostate  05/23/2011    Procedure: TRANSURETHRAL RESECTION OF THE PROSTATE (TURP);  Surgeon: Ky Barban;  Location: [facility];  Service: Urology;  Laterality: N/A;  placement of suprapubic catheter  . Colonoscopy  07/22/2012    Procedure: COLONOSCOPY;  Surgeon: Malissa Hippo, MD;  Location: [facility];  Service: Endoscopy;  Laterality: N/A;  225-moved to 200 Ann notified pt    Family History  Problem Relation Age of Onset  . Anesthesia problems Neg Hx   . Hypotension Neg Hx   . Pseudochol deficiency Neg Hx   . Malignant hyperthermia Neg  Hx     History  Substance Use Topics  . Smoking status: Current Every Day Smoker -- 1.50 packs/day for 30 years    Types: Cigarettes  . Smokeless tobacco: Not on file  . Alcohol Use: No     Comment: stopped 2003     Review of Systems  Unable to perform ROS: Other  Musculoskeletal: Positive for joint swelling.       + leg pain    Allergies  Penicillins; Vancomycin; and Codeine  Home Medications   Current Outpatient Rx  Name  Route  Sig  Dispense  Refill  . buPROPion (WELLBUTRIN XL) 150 MG 24 hr tablet   Oral   Take 150 mg by mouth every morning.           . diazepam (VALIUM) 10 MG tablet   Oral   Take 10 mg by mouth every 8 (eight) hours as needed. For anxiety          . DULoxetine (CYMBALTA) 60 MG capsule   Oral   Take 60 mg by mouth 2 (two) times daily.           . fentaNYL (DURAGESIC - DOSED MCG/HR) 100 MCG/HR   Transdermal   Place 3 patches onto the skin every other day. This dose was verified, it is correct          .  levothyroxine (SYNTHROID, LEVOTHROID) 88 MCG tablet   Oral   Take 88 mcg by mouth daily.         Marland Kitchen lithium carbonate 300 MG capsule   Oral   Take 300 mg by mouth 3 (three) times daily with meals.           Marland Kitchen oxyCODONE (OXY IR/ROXICODONE) 5 MG immediate release tablet   Oral   Take 5 mg by mouth every 8 (eight) hours as needed. pain          . promethazine (PHENERGAN) 25 MG tablet   Oral   Take 25 mg by mouth every 6 (six) hours as needed. Nausea and vomiting            BP 154/94  Pulse 94  Temp(Src) 97.7 F (36.5 C) (Oral)  Ht 5\' 7"  (1.702 m)  Wt 160 lb (72.576 kg)  BMI 25.05 kg/m2  SpO2 100%  Physical Exam  Nursing note and vitals reviewed. Constitutional: He is oriented to person, place, and time. No distress.  HENT:  Head: Normocephalic and atraumatic.  Eyes: Conjunctivae are normal. Right eye exhibits no discharge. Left eye exhibits no discharge. No scleral icterus.  Neck: Normal range of motion. Neck  supple.  Cardiovascular: Normal rate, regular rhythm, normal heart sounds and intact distal pulses.   Pulses:      Dorsalis pedis pulses are 2+ on the right side, and 2+ on the left side.       Posterior tibial pulses are 2+ on the right side, and 2+ on the left side.  Capillary refill < 3 seconds b/l  Pulmonary/Chest: Effort normal and breath sounds normal. No respiratory distress. He has no wheezes. He has no rales.  Abdominal: Soft. He exhibits no distension. There is no tenderness. There is no rebound.  Musculoskeletal:       Left hip: He exhibits no tenderness, no bony tenderness, no swelling, no crepitus and no deformity.       Left knee: He exhibits decreased range of motion and swelling. He exhibits no ecchymosis and no erythema. Tenderness found.       Left ankle: Normal.       Cervical back: He exhibits tenderness and bony tenderness. He exhibits normal range of motion, no edema, no deformity and no laceration.       Thoracic back: He exhibits no tenderness and no bony tenderness.       Lumbar back: He exhibits tenderness. He exhibits normal range of motion, no bony tenderness, no swelling, no deformity, no laceration and no spasm.       Left lower leg: He exhibits tenderness, swelling and deformity. He exhibits no laceration.       Legs: + superficial abrasion midline at base of neck as well as around right flank. No midline tenderness, step offs or deformities appreciated on exam of the thoracic and lumbar spine.  No evidence to suggest compartment syndrome in LLE  Lymphadenopathy:    He has no cervical adenopathy.  Neurological: He is alert and oriented to person, place, and time. No sensory deficit.  Sensation of LLE intact  Skin: Skin is warm and dry. He is not diaphoretic. No erythema.  Psychiatric: He has a normal mood and affect.    ED Course  Procedures (including critical care time)  Labs Reviewed  CBC WITH DIFFERENTIAL - Abnormal; Notable for the following:     WBC 27.4 (*)    RBC 3.93 (*)    Hemoglobin 12.0 (*)  HCT 35.6 (*)    Neutrophils Relative 88 (*)    Lymphocytes Relative 5 (*)    Neutro Abs 24.1 (*)    Monocytes Absolute 1.9 (*)    All other components within normal limits  COMPREHENSIVE METABOLIC PANEL - Abnormal; Notable for the following:    Sodium 132 (*)    Glucose, Bld 169 (*)    All other components within normal limits  PROTIME-INR   No results found.   No diagnosis found.    MDM  Patient presents for LLE pain after tripping and falling down 14 carpeted stairs. No witness to the fall and patient poor historian 2/2 psych history. Work up to include CBC, CMP, PT/INR, DG L femur, knee and tib/fib, CT head and C-spine without contrast, as well as CT chest and abdomen with contrast. No evidence of compartment syndrome on physical exam. Visible swelling highly suspect for tib/fib fracture. Patient's pain being managed with IV narcotic pain meds. Patient seen by Dr. Manus Gunning with whom this work up and management has been discussed.  CBC significant for leukocytosis of 27.4. Other labs c/w prior work ups. CTs and Xrays pending. Patient signed out to Dr. Kathie Rhodes Rancour at end of shift for further management and determination of patient disposition.  Filed Vitals:   08/30/12 1621 08/30/12 1636  BP:  154/94  Pulse:  94  Temp:  97.7 F (36.5 C)  TempSrc:  Oral  Height:  5\' 7"  (1.702 m)  Weight:  160 lb (72.576 kg)  SpO2: 97% 100%       Antony Madura, PA-C 08/30/12 2235  Antony Madura, PA-C 08/31/12 1610

## 2012-08-30 NOTE — Anesthesia Preprocedure Evaluation (Signed)
Anesthesia Evaluation  Patient identified by MRN, date of birth, ID band Patient awake    Reviewed: Allergy & Precautions, H&P , Patient's Chart, lab work & pertinent test results, reviewed documented beta blocker date and time   History of Anesthesia Complications Negative for: history of anesthetic complications  Airway Mallampati: III TM Distance: <3 FB Neck ROM: full    Dental no notable dental hx.    Pulmonary COPD breath sounds clear to auscultation  Pulmonary exam normal       Cardiovascular Exercise Tolerance: Good negative cardio ROS  Rhythm:regular Rate:Normal     Neuro/Psych PSYCHIATRIC DISORDERS Anxiety Depression negative neurological ROS  negative psych ROS   GI/Hepatic negative GI ROS, Neg liver ROS, (+) Hepatitis -, C  Endo/Other  negative endocrine ROSHypothyroidism   Renal/GU negative Renal ROS     Musculoskeletal   Abdominal   Peds  Hematology negative hematology ROS (+)   Anesthesia Other Findings COPD (chronic obstructive pulmonary disease)     Hypothyroidism        BPH (benign prostatic hyperplasia)     Anxiety        Depression     Cirrhosis        Pancreatitis chronic     Epicondylitis   hips    Hepatitis C    Reproductive/Obstetrics negative OB ROS                           Anesthesia Physical Anesthesia Plan  ASA: III and emergent  Anesthesia Plan: General ETT   Post-op Pain Management:    Induction:   Airway Management Planned:   Additional Equipment:   Intra-op Plan:   Post-operative Plan:   Informed Consent: I have reviewed the patients History and Physical, chart, labs and discussed the procedure including the risks, benefits and alternatives for the proposed anesthesia with the patient or authorized representative who has indicated his/her understanding and acceptance.   Dental Advisory Given  Plan Discussed with: CRNA and  Surgeon  Anesthesia Plan Comments:         Anesthesia Quick Evaluation

## 2012-08-30 NOTE — ED Notes (Signed)
Patient transported to CT 

## 2012-08-30 NOTE — ED Notes (Signed)
ZOX:[id number]<AV> Expected date:08/30/12<BR> Expected time: 4:01 PM<BR> Means of arrival:Ambulance<BR> Comments:<BR> Left knee injury

## 2012-08-30 NOTE — H&P (Addendum)
[name] is an 52 y.o. male.   Chief Complaint: left leg pain HPI: Fell at noon down 12 steps no LOC. C/O pain  Past Medical History  Diagnosis Date  . COPD (chronic obstructive pulmonary disease)   . Hypothyroidism   . BPH (benign prostatic hyperplasia)   . Anxiety   . Depression   . Cirrhosis   . Pancreatitis chronic   . Epicondylitis     hips  . Hepatitis C     Past Surgical History  Procedure Laterality Date  . Total hip arthroplasty  1 year ago    right hip-[place]  . Appendectomy  age 34  . Femur fracture surgery      otif  . Transurethral resection of prostate  05/23/2011    Procedure: TRANSURETHRAL RESECTION OF THE PROSTATE (TURP);  Surgeon: Ky Barban;  Location: [facility];  Service: Urology;  Laterality: N/A;  placement of suprapubic catheter  . Colonoscopy  07/22/2012    Procedure: COLONOSCOPY;  Surgeon: Malissa Hippo, MD;  Location: [facility];  Service: Endoscopy;  Laterality: N/A;  225-moved to 200 Ann notified pt    Family History  Problem Relation Age of Onset  . Anesthesia problems Neg Hx   . Hypotension Neg Hx   . Pseudochol deficiency Neg Hx   . Malignant hyperthermia Neg Hx    Social History:  reports that he has been smoking Cigarettes.  He has a 45 pack-year smoking history. He does not have any smokeless tobacco history on file. He reports that he uses illicit drugs (Marijuana). He reports that he does not drink alcohol.  Allergies:  Allergies  Allergen Reactions  . Penicillins Anaphylaxis  . Vancomycin     redman syndrome  . Codeine Hives     (Not in a hospital admission)  Results for orders placed during the hospital encounter of 08/30/12 (from the past 48 hour(s))  CBC WITH DIFFERENTIAL     Status: Abnormal   Collection Time    08/30/12  7:38 PM      Result Value Range   WBC 27.4 (*) 4.0 - 10.5 K/uL   RBC 3.93 (*) 4.22 - 5.81 MIL/uL   Hemoglobin 12.0 (*) 13.0 - 17.0 g/dL   HCT 96.0 (*) 45.4 - 09.8 %   MCV 90.6   78.0 - 100.0 fL   MCH 30.5  26.0 - 34.0 pg   MCHC 33.7  30.0 - 36.0 g/dL   RDW 11.9  14.7 - 82.9 %   Platelets 343  150 - 400 K/uL   Neutrophils Relative 88 (*) 43 - 77 %   Lymphocytes Relative 5 (*) 12 - 46 %   Monocytes Relative 7  3 - 12 %   Eosinophils Relative 0  0 - 5 %   Basophils Relative 0  0 - 1 %   Neutro Abs 24.1 (*) 1.7 - 7.7 K/uL   Lymphs Abs 1.4  0.7 - 4.0 K/uL   Monocytes Absolute 1.9 (*) 0.1 - 1.0 K/uL   Eosinophils Absolute 0.0  0.0 - 0.7 K/uL   Basophils Absolute 0.0  0.0 - 0.1 K/uL   Smear Review MORPHOLOGY UNREMARKABLE    COMPREHENSIVE METABOLIC PANEL     Status: Abnormal   Collection Time    08/30/12  7:38 PM      Result Value Range   Sodium 132 (*) 135 - 145 mEq/L   Potassium 3.6  3.5 - 5.1 mEq/L   Chloride 96  96 - 112 mEq/L   CO2 24  19 - 32 mEq/L   Glucose, Bld 169 (*) 70 - 99 mg/dL   BUN 6  6 - 23 mg/dL   Creatinine, Ser 4.09  0.50 - 1.35 mg/dL   Calcium 8.9  8.4 - 81.1 mg/dL   Total Protein 7.5  6.0 - 8.3 g/dL   Albumin 3.8  3.5 - 5.2 g/dL   AST 26  0 - 37 U/L   ALT 16  0 - 53 U/L   Alkaline Phosphatase 87  39 - 117 U/L   Total Bilirubin 0.9  0.3 - 1.2 mg/dL   GFR calc non Af Amer >90  >90 mL/min   GFR calc Af Amer >90  >90 mL/min   Comment:            The eGFR has been calculated     using the CKD EPI equation.     This calculation has not been     validated in all clinical     situations.     eGFR's persistently     <90 mL/min signify     possible Chronic Kidney Disease.  PROTIME-INR     Status: None   Collection Time    08/30/12  7:38 PM      Result Value Range   Prothrombin Time 12.9  11.6 - 15.2 seconds   INR 0.98  0.00 - 1.49   Dg Femur Left  08/30/2012  *RADIOLOGY REPORT*  Clinical Data: Left leg pain post fall  LEFT FEMUR - 2 VIEW  Comparison: None  Findings: Osseous demineralization. Mild narrowing and knee joint. Knee joint effusion present. Questionable lucency at the lateral tibial plateau, please refer to dedicated knee  radiographs. Femur appears intact.  IMPRESSION: No left femoral fracture identified. Left knee joint effusion with question lucency at the lateral tibial plateau, please refer to left knee radiographs.   Original Report Authenticated By: Ulyses Southward, M.D.    Dg Knee 1-2 Views Left  08/30/2012  *RADIOLOGY REPORT*  Clinical Data: Left leg pain post fall  LEFT KNEE - 1-2 VIEW  Comparison: None  Findings: Osseous demineralization. Comminuted fracture proximal tibial metaphysis suspect extending into the lateral compartment joint space. Additional displaced fracture of the left fibular neck. Visualized femur appears intact. Knee joint effusion present. No patellar fracture identified.  IMPRESSION: Displaced proximal left tibial and fibular fractures as above with associated knee joint effusion.   Original Report Authenticated By: Ulyses Southward, M.D.    Dg Tibia/fibula Left  08/30/2012  *RADIOLOGY REPORT*  Clinical Data: Left leg pain post fall  LEFT TIBIA AND FIBULA - 2 VIEW  Comparison: None  Findings: Diffuse osseous demineralization. Comminuted proximal left tibial metaphyseal fracture with intra- articular extension at lateral compartment. Displaced left fibular neck fracture. Remainder of tibia and fibula appear intact. Ankle joint alignment normal. Soft tissues unremarkable.  IMPRESSION: Displaced proximal left tibial and fibular fractures as above.   Original Report Authenticated By: Ulyses Southward, M.D.    Ct Head Wo Contrast  08/30/2012  *RADIOLOGY REPORT*  Clinical Data:  Fall, neck pain  CT HEAD WITHOUT CONTRAST CT CERVICAL SPINE WITHOUT CONTRAST  Technique:  Multidetector CT imaging of the head and cervical spine was performed following the standard protocol without intravenous contrast.  Multiplanar CT image reconstructions of the cervical spine were also generated.  Comparison:  None  CT HEAD  Findings: Generalized atrophy. Normal ventricle morphology. No midline shift or mass effect. Scattered motion  artifacts despite repeating images. Within limitations of motion, no gross intracranial hemorrhage, mass lesion or evidence of acute infarction identified. Bones and sinuses grossly unremarkable.  IMPRESSION: No definite acute intracranial abnormalities identified on exam limited by motion.  CT CERVICAL SPINE  Findings: Prevertebral soft tissues normal thickness. Vertebral body and disc space heights maintained. No acute fracture, subluxation or bone destruction. Visualized skull base intact. Few blebs at right apex.  IMPRESSION: No acute cervical spine abnormalities.   Original Report Authenticated By: Ulyses Southward, M.D.    Ct Chest W Contrast  08/30/2012  *RADIOLOGY REPORT*  Clinical Data:  Fall  CT CHEST, ABDOMEN AND PELVIS WITH CONTRAST  Technique:  Multidetector CT imaging of the chest, abdomen and pelvis was performed following the standard protocol during bolus administration of intravenous contrast.  Contrast: OMNIPAQUE IOHEXOL 300 MG/ML  SOLN  Comparison:  07/10/2010 abdominal CT  CT CHEST  Findings:  Mild ectasia of the ascending aorta, tapers along the arch to a normal caliber.  No anterior mediastinal hematoma.  Normal heart size.  No pleural or pericardial effusion.  No intrathoracic lymphadenopathy.  Fractured distal left clavicle on image 1 is incompletely imaged and age indeterminate. Fractures of the head of the right eleventh and twelfth ribs appear acute.  Mild emphysema and apical bulla.  Central airways are patent.  No pneumothorax.  Mild dependent atelectasis.  IMPRESSION: Distal left clavicle fracture is incompletely imaged though favored remote.  Correlate with point tenderness.  Fractures of the head of the right eleventh and twelfth ribs.  Otherwise, no acute process within the chest.  CT ABDOMEN AND PELVIS  Findings:  Unremarkable liver.  Absent gallbladder.  Minimal biliary ductal prominence post cholecystectomy is nonspecific. Unremarkable spleen, pancreas, adrenal glands.   Symmetric renal enhancement.  Subcentimeter hypodensity upper pole left kidney, incompletely characterized.  No hydronephrosis or hydroureter.  No CT evidence for colitis.  Appendix not identified.  No right lower quadrant inflammation.  Small bowel loops are normal course and caliber.  No free intraperitoneal air or fluid.  No lymphadenopathy.  There is scattered atherosclerotic calcification of the aorta and its branches. No aneurysmal dilatation.  Thin-walled bladder.  Right hip arthroplasty.  Fractures of the right to L2 and L3 transverse processes.  IMPRESSION: Fractures of the right L2 and L3 transverse processes.  Otherwise, no acute abdominopelvic process identified.   Original Report Authenticated By: Jearld Lesch, M.D.    Ct Cervical Spine Wo Contrast  08/30/2012  *RADIOLOGY REPORT*  Clinical Data:  Fall, neck pain  CT HEAD WITHOUT CONTRAST CT CERVICAL SPINE WITHOUT CONTRAST  Technique:  Multidetector CT imaging of the head and cervical spine was performed following the standard protocol without intravenous contrast.  Multiplanar CT image reconstructions of the cervical spine were also generated.  Comparison:  None  CT HEAD  Findings: Generalized atrophy. Normal ventricle morphology. No midline shift or mass effect. Scattered motion artifacts despite repeating images. Within limitations of motion, no gross intracranial hemorrhage, mass lesion or evidence of acute infarction identified. Bones and sinuses grossly unremarkable.  IMPRESSION: No definite acute intracranial abnormalities identified on exam limited by motion.  CT CERVICAL SPINE  Findings: Prevertebral soft tissues normal thickness. Vertebral body and disc space heights maintained. No acute fracture, subluxation or bone destruction. Visualized skull base intact. Few blebs at right apex.  IMPRESSION: No acute cervical spine abnormalities.   Original Report Authenticated By: Ulyses Southward, M.D.    Ct Abdomen Pelvis W Contrast  08/30/2012   *  RADIOLOGY REPORT*  Clinical Data:  Fall  CT CHEST, ABDOMEN AND PELVIS WITH CONTRAST  Technique:  Multidetector CT imaging of the chest, abdomen and pelvis was performed following the standard protocol during bolus administration of intravenous contrast.  Contrast: OMNIPAQUE IOHEXOL 300 MG/ML  SOLN  Comparison:  07/10/2010 abdominal CT  CT CHEST  Findings:  Mild ectasia of the ascending aorta, tapers along the arch to a normal caliber.  No anterior mediastinal hematoma.  Normal heart size.  No pleural or pericardial effusion.  No intrathoracic lymphadenopathy.  Fractured distal left clavicle on image 1 is incompletely imaged and age indeterminate. Fractures of the head of the right eleventh and twelfth ribs appear acute.  Mild emphysema and apical bulla.  Central airways are patent.  No pneumothorax.  Mild dependent atelectasis.  IMPRESSION: Distal left clavicle fracture is incompletely imaged though favored remote.  Correlate with point tenderness.  Fractures of the head of the right eleventh and twelfth ribs.  Otherwise, no acute process within the chest.  CT ABDOMEN AND PELVIS  Findings:  Unremarkable liver.  Absent gallbladder.  Minimal biliary ductal prominence post cholecystectomy is nonspecific. Unremarkable spleen, pancreas, adrenal glands.  Symmetric renal enhancement.  Subcentimeter hypodensity upper pole left kidney, incompletely characterized.  No hydronephrosis or hydroureter.  No CT evidence for colitis.  Appendix not identified.  No right lower quadrant inflammation.  Small bowel loops are normal course and caliber.  No free intraperitoneal air or fluid.  No lymphadenopathy.  There is scattered atherosclerotic calcification of the aorta and its branches. No aneurysmal dilatation.  Thin-walled bladder.  Right hip arthroplasty.  Fractures of the right to L2 and L3 transverse processes.  IMPRESSION: Fractures of the right L2 and L3 transverse processes.  Otherwise, no acute abdominopelvic process  identified.   Original Report Authenticated By: Jearld Lesch, M.D.     Review of Systems  Constitutional: Negative.   HENT: Negative.   Respiratory: Negative.   Cardiovascular: Negative.   Gastrointestinal: Negative.   Genitourinary: Positive for flank pain.  Musculoskeletal: Positive for back pain and joint pain.  Skin: Negative.   Neurological: Positive for focal weakness.  Endo/Heme/Allergies: Negative.   Psychiatric/Behavioral: Negative.     Blood pressure 154/94, pulse 94, temperature 97.7 F (36.5 C), temperature source Oral, height 5\' 7"  (1.702 m), weight 72.576 kg (160 lb), SpO2 100.00%. Physical Exam  Constitutional: He is oriented to person, place, and time. He appears distressed.  HENT:  Head: Normocephalic.  Eyes: Pupils are equal, round, and reactive to light.  Neck: Normal range of motion. Neck supple.  Cardiovascular: Normal rate and regular rhythm.   Respiratory: Effort normal and breath sounds normal.  GI: Soft. Bowel sounds are normal.  Musculoskeletal:  Compartments tense left leg. Pain with passive extention left foot. 1+ DP, PT.  Neurological: He is alert and oriented to person, place, and time.  Skin: Skin is warm and dry.  Psychiatric: He has a normal mood and affect.  Pelvis stable. Minimal neck pain. Compartment pressures: Ant 90mm, lat 85mm. Post 80mm.  Assessment/Plan Left leg tibia fracture with compartment syndrome 90mm. Emergent fasciotomy. Risks benefits discussed. Clavicle fracture. Rib fxs. No pneumo. Consult trauma.  BEANE,JEFFREY C 08/30/2012, 10:50 PM

## 2012-08-31 ENCOUNTER — Encounter (HOSPITAL_COMMUNITY): Admission: EM | Disposition: A | Discharge status: Home Health | Payer: Self-pay | Source: Home / Self Care

## 2012-08-31 ENCOUNTER — Inpatient Hospital Stay (HOSPITAL_COMMUNITY): Payer: Medicaid Other | Admitting: Anesthesiology

## 2012-08-31 ENCOUNTER — Encounter (HOSPITAL_COMMUNITY): Payer: Self-pay | Admitting: Anesthesiology

## 2012-08-31 ENCOUNTER — Encounter (HOSPITAL_COMMUNITY): Payer: Self-pay | Admitting: Surgery

## 2012-08-31 DIAGNOSIS — S2249XA Multiple fractures of ribs, unspecified side, initial encounter for closed fracture: Secondary | ICD-10-CM

## 2012-08-31 DIAGNOSIS — S82409A Unspecified fracture of shaft of unspecified fibula, initial encounter for closed fracture: Secondary | ICD-10-CM

## 2012-08-31 DIAGNOSIS — S32009A Unspecified fracture of unspecified lumbar vertebra, initial encounter for closed fracture: Secondary | ICD-10-CM

## 2012-08-31 DIAGNOSIS — S82209A Unspecified fracture of shaft of unspecified tibia, initial encounter for closed fracture: Secondary | ICD-10-CM

## 2012-08-31 LAB — RAPID URINE DRUG SCREEN, HOSP PERFORMED
Barbiturates: NOT DETECTED
Benzodiazepines: POSITIVE — AB
Cocaine: NOT DETECTED

## 2012-08-31 LAB — BASIC METABOLIC PANEL
BUN: 6 mg/dL (ref 6–23)
Calcium: 8.5 mg/dL (ref 8.4–10.5)
Creatinine, Ser: 0.82 mg/dL (ref 0.50–1.35)
GFR calc Af Amer: >90 mL/min (ref >90)
GFR calc non Af Amer: >90 mL/min (ref >90)
Potassium: 4.2 mEq/L (ref 3.5–5.1)

## 2012-08-31 LAB — CBC
HCT: 33.6 % — ABNORMAL LOW (ref 39.0–52.0)
MCH: 29.9 pg (ref 26.0–34.0)
MCHC: 32.7 g/dL (ref 30.0–36.0)
MCV: 91.3 fL (ref 78.0–100.0)
RDW: 12.7 % (ref 11.5–15.5)

## 2012-08-31 LAB — ETHANOL: Alcohol, Ethyl (B): <11 mg/dL (ref 0–11)

## 2012-08-31 SURGERY — OPEN REDUCTION INTERNAL FIXATION (ORIF) TIBIA FRACTURE
Anesthesia: General | Laterality: Left

## 2012-08-31 SURGERY — CANCELLED PROCEDURE

## 2012-08-31 MED ORDER — LABETALOL HCL 5 MG/ML IV SOLN
INTRAVENOUS | Status: AC
  Filled 2012-08-31: qty 4

## 2012-08-31 MED ORDER — FENTANYL CITRATE 0.05 MG/ML IJ SOLN
INTRAMUSCULAR | Status: AC
  Filled 2012-08-31: qty 2

## 2012-08-31 MED ORDER — MEPERIDINE HCL 50 MG/ML IJ SOLN: 6.2500 mg | INTRAMUSCULAR | Status: DC | PRN

## 2012-08-31 MED ORDER — HYDROMORPHONE HCL PF 1 MG/ML IJ SOLN: 0.2500 mg | INTRAMUSCULAR | Status: DC | PRN

## 2012-08-31 MED ORDER — FENTANYL 50 MCG/HR TD PT72: 300.0000 ug | MEDICATED_PATCH | TRANSDERMAL | Status: DC

## 2012-08-31 MED ORDER — FENTANYL CITRATE 0.05 MG/ML IJ SOLN
INTRAMUSCULAR | Status: DC | PRN
  Administered 2012-08-31: 100 ug via INTRAVENOUS
  Administered 2012-08-31: 50 ug via INTRAVENOUS

## 2012-08-31 MED ORDER — FENTANYL 100 MCG/HR TD PT72
300.0000 ug | MEDICATED_PATCH | TRANSDERMAL | Status: DC
  Administered 2012-09-03: 300 ug via TRANSDERMAL
  Filled 2012-08-31: qty 2
  Filled 2012-08-31: qty 1

## 2012-08-31 MED ORDER — LIDOCAINE HCL (CARDIAC) 20 MG/ML IV SOLN
INTRAVENOUS | Status: DC | PRN
  Administered 2012-08-31: 50 mg via INTRAVENOUS

## 2012-08-31 MED ORDER — DIAZEPAM 5 MG PO TABS
10.0000 mg | ORAL_TABLET | Freq: Three times a day (TID) | ORAL | Status: DC | PRN
  Administered 2012-08-31 – 2012-09-05 (×11): 10 mg via ORAL
  Filled 2012-08-31 (×3): qty 2
  Filled 2012-08-31: qty 1
  Filled 2012-08-31 (×7): qty 2
  Filled 2012-08-31: qty 1
  Filled 2012-08-31: qty 2

## 2012-08-31 MED ORDER — CLINDAMYCIN PHOSPHATE 900 MG/50ML IV SOLN
INTRAVENOUS | Status: DC | PRN
  Administered 2012-08-31: 900 mg via INTRAVENOUS

## 2012-08-31 MED ORDER — LABETALOL HCL 5 MG/ML IV SOLN
2.0000 mg | INTRAVENOUS | Status: DC | PRN
  Administered 2012-08-31 (×3): 2 mg via INTRAVENOUS

## 2012-08-31 MED ORDER — FENTANYL 50 MCG/HR TD PT72: 100.0000 ug | MEDICATED_PATCH | TRANSDERMAL | Status: AC

## 2012-08-31 MED ORDER — BLISTEX EX OINT
TOPICAL_OINTMENT | Freq: Two times a day (BID) | CUTANEOUS | Status: DC
  Administered 2012-09-01 – 2012-09-04 (×2): via TOPICAL
  Administered 2012-09-05: 1 via TOPICAL
  Filled 2012-08-31: qty 10

## 2012-08-31 MED ORDER — CLINDAMYCIN PHOSPHATE 900 MG/50ML IV SOLN
900.0000 mg | Freq: Three times a day (TID) | INTRAVENOUS | Status: AC
  Administered 2012-08-31 (×3): 900 mg via INTRAVENOUS
  Filled 2012-08-31 (×4): qty 50

## 2012-08-31 MED ORDER — SODIUM CHLORIDE 0.9 % IV SOLN
INTRAVENOUS | Status: DC | PRN
  Administered 2012-08-31: via INTRAVENOUS

## 2012-08-31 MED ORDER — ENOXAPARIN SODIUM 40 MG/0.4ML ~~LOC~~ SOLN
40.0000 mg | SUBCUTANEOUS | Status: DC
  Administered 2012-08-31 – 2012-09-02 (×3): 40 mg via SUBCUTANEOUS
  Filled 2012-08-31 (×5): qty 0.4

## 2012-08-31 MED ORDER — MIDAZOLAM HCL 5 MG/5ML IJ SOLN
INTRAMUSCULAR | Status: DC | PRN
  Administered 2012-08-31: 2 mg via INTRAVENOUS

## 2012-08-31 MED ORDER — FENTANYL 50 MCG/HR TD PT72
200.0000 ug | MEDICATED_PATCH | TRANSDERMAL | Status: AC
  Administered 2012-08-31: 200 ug via TRANSDERMAL
  Filled 2012-08-31: qty 4

## 2012-08-31 MED ORDER — FENTANYL CITRATE 0.05 MG/ML IJ SOLN
50.0000 ug | INTRAMUSCULAR | Status: DC | PRN
  Administered 2012-08-31: 50 ug via INTRAVENOUS
  Filled 2012-08-31: qty 2

## 2012-08-31 MED ORDER — CLINDAMYCIN PHOSPHATE 900 MG/50ML IV SOLN
INTRAVENOUS | Status: AC
  Filled 2012-08-31: qty 50

## 2012-08-31 MED ORDER — OXYCODONE HCL 5 MG PO TABS
10.0000 mg | ORAL_TABLET | ORAL | Status: DC | PRN
Start: 2012-08-31 — End: 2012-09-05
  Administered 2012-08-31 – 2012-09-05 (×17): 20 mg via ORAL
  Filled 2012-08-31 (×17): qty 4

## 2012-08-31 MED ORDER — MIDAZOLAM HCL 2 MG/2ML IJ SOLN: 0.5000 mg | Freq: Once | INTRAMUSCULAR | Status: DC | PRN

## 2012-08-31 MED ORDER — PROMETHAZINE HCL 25 MG/ML IJ SOLN: 6.2500 mg | INTRAMUSCULAR | Status: DC | PRN

## 2012-08-31 MED ORDER — PROPOFOL 10 MG/ML IV BOLUS
INTRAVENOUS | Status: DC | PRN
Start: 2012-08-31 — End: 2012-08-31
  Administered 2012-08-31: 180 mg via INTRAVENOUS

## 2012-08-31 MED ORDER — SUCCINYLCHOLINE CHLORIDE 20 MG/ML IJ SOLN
INTRAMUSCULAR | Status: DC | PRN
  Administered 2012-08-31: 100 mg via INTRAVENOUS

## 2012-08-31 SURGICAL SUPPLY — 58 items
BANDAGE ELASTIC 4 VELCRO ST LF (GAUZE/BANDAGES/DRESSINGS) ×3 IMPLANT
BANDAGE ELASTIC 6 VELCRO ST LF (GAUZE/BANDAGES/DRESSINGS) ×3 IMPLANT
BANDAGE GAUZE ELAST BULKY 4 IN (GAUZE/BANDAGES/DRESSINGS) ×3 IMPLANT
BLADE SURG 10 STRL SS (BLADE) ×3 IMPLANT
BLADE SURG 15 STRL LF DISP TIS (BLADE) ×2 IMPLANT
BLADE SURG 15 STRL SS (BLADE) ×2
BLADE SURG ROTATE 9660 (MISCELLANEOUS) IMPLANT
CLEANER TIP ELECTROSURG 2X2 (MISCELLANEOUS) ×3 IMPLANT
CLOTH BEACON ORANGE TIMEOUT ST (SAFETY) ×3 IMPLANT
COVER MAYO STAND STRL (DRAPES) ×3 IMPLANT
CUFF TOURNIQUET SINGLE 34IN LL (TOURNIQUET CUFF) IMPLANT
CUFF TOURNIQUET SINGLE 44IN (TOURNIQUET CUFF) IMPLANT
DRAPE C-ARM 42X72 X-RAY (DRAPES) ×3 IMPLANT
DRAPE INCISE IOBAN 66X45 STRL (DRAPES) ×3 IMPLANT
DRAPE ORTHO SPLIT 77X108 STRL (DRAPES)
DRAPE SURG ORHT 6 SPLT 77X108 (DRAPES) IMPLANT
DRAPE U-SHAPE 47X51 STRL (DRAPES) ×3 IMPLANT
DRSG ADAPTIC 3X8 NADH LF (GAUZE/BANDAGES/DRESSINGS) ×3 IMPLANT
DRSG PAD ABDOMINAL 8X10 ST (GAUZE/BANDAGES/DRESSINGS) ×12 IMPLANT
ELECT REM PT RETURN 9FT ADLT (ELECTROSURGICAL) ×2
ELECTRODE REM PT RTRN 9FT ADLT (ELECTROSURGICAL) ×2 IMPLANT
EVACUATOR 1/8 PVC DRAIN (DRAIN) IMPLANT
GLOVE BIOGEL PI IND STRL 8 (GLOVE) ×4 IMPLANT
GLOVE BIOGEL PI INDICATOR 8 (GLOVE) ×2
GLOVE SS BIOGEL STRL SZ 8 (GLOVE) ×2 IMPLANT
GLOVE SUPERSENSE BIOGEL SZ 8 (GLOVE) ×1
GOWN EXTRA PROTECTION XL (GOWNS) ×3 IMPLANT
GOWN STRL NON-REIN LRG LVL3 (GOWN DISPOSABLE) ×9 IMPLANT
KIT BASIN OR (CUSTOM PROCEDURE TRAY) ×3 IMPLANT
KIT ROOM TURNOVER OR (KITS) ×3 IMPLANT
MANIFOLD NEPTUNE II (INSTRUMENTS) ×3 IMPLANT
NEEDLE 22X1 1/2 (OR ONLY) (NEEDLE) IMPLANT
NS IRRIG 1000ML POUR BTL (IV SOLUTION) ×3 IMPLANT
PACK ORTHO EXTREMITY (CUSTOM PROCEDURE TRAY) ×3 IMPLANT
PAD ARMBOARD 7.5X6 YLW CONV (MISCELLANEOUS) ×6 IMPLANT
PAD CAST 4YDX4 CTTN HI CHSV (CAST SUPPLIES) ×2 IMPLANT
PADDING CAST COTTON 4X4 STRL (CAST SUPPLIES) ×2
PADDING CAST COTTON 6X4 STRL (CAST SUPPLIES) ×3 IMPLANT
SPONGE GAUZE 4X4 12PLY (GAUZE/BANDAGES/DRESSINGS) ×3 IMPLANT
SPONGE LAP 18X18 X RAY DECT (DISPOSABLE) ×3 IMPLANT
STAPLER VISISTAT 35W (STAPLE) ×3 IMPLANT
STOCKINETTE IMPERVIOUS LG (DRAPES) ×3 IMPLANT
SUCTION FRAZIER TIP 10 FR DISP (SUCTIONS) ×3 IMPLANT
SUT ETHIBOND 2 0 V5 (SUTURE) ×3 IMPLANT
SUT VIC AB 0 CT1 27 (SUTURE) ×4
SUT VIC AB 0 CT1 27XBRD ANBCTR (SUTURE) ×4 IMPLANT
SUT VIC AB 1 CT1 27 (SUTURE) ×4
SUT VIC AB 1 CT1 27XBRD ANBCTR (SUTURE) ×4 IMPLANT
SUT VIC AB 2-0 CT1 27 (SUTURE) ×4
SUT VIC AB 2-0 CT1 TAPERPNT 27 (SUTURE) ×4 IMPLANT
SYR 20CC LL (SYRINGE) ×3 IMPLANT
SYR 20ML ECCENTRIC (SYRINGE) IMPLANT
TOWEL OR 17X24 6PK STRL BLUE (TOWEL DISPOSABLE) ×3 IMPLANT
TOWEL OR 17X26 10 PK STRL BLUE (TOWEL DISPOSABLE) ×3 IMPLANT
TRAY FOLEY CATH 14FR (SET/KITS/TRAYS/PACK) IMPLANT
TUBE CONNECTING 12X1/4 (SUCTIONS) ×3 IMPLANT
WATER STERILE IRR 1000ML POUR (IV SOLUTION) ×6 IMPLANT
YANKAUER SUCT BULB TIP NO VENT (SUCTIONS) ×3 IMPLANT

## 2012-08-31 NOTE — Progress Notes (Signed)
Patient examined and I agree with the assessment and plan I also spoke to his mother at the bedside. Adjusted pain medication in accordance with home meds. Violeta Gelinas, MD, MPH, FACS Pager: [phone]  08/31/2012 1:45 PM

## 2012-08-31 NOTE — Brief Op Note (Signed)
08/30/2012 - 08/31/2012  1:24 AM  PATIENT:  Tollie Pizza Loden  52 y.o. male  PRE-OPERATIVE DIAGNOSIS:  Left leg compartment syndrome  POST-OPERATIVE DIAGNOSIS:  * No post-op diagnosis entered *  PROCEDURE:  Procedure(s): FASCIOTOMY (Left)  SURGEON:  Surgeon(s) and Role:    * Javier Docker, MD - Primary  PHYSICIAN ASSISTANT:   ASSISTANTS: none   ANESTHESIA:   general  EBL:     BLOOD ADMINISTERED:none  DRAINS: none   LOCAL MEDICATIONS USED:  NONE  SPECIMEN:  No Specimen  DISPOSITION OF SPECIMEN:  N/A  COUNTS:  YES  TOURNIQUET:  * No tourniquets in log *  DICTATION: .Other Dictation: Dictation Number [id number]  PLAN OF CARE: Admit to inpatient   PATIENT DISPOSITION:  PACU - hemodynamically stable.   Delay start of Pharmacological VTE agent (>24hrs) due to surgical blood loss or risk of bleeding: yes

## 2012-08-31 NOTE — ED Provider Notes (Signed)
Medical screening examination/treatment/procedure(s) were conducted as a shared visit with non-physician practitioner(s) and myself.  I personally evaluated the patient during the encounter  Patient presents with left leg pain after a fall down a flight of stairs around noon today. He denies loss of consciousness. He is a poor historian and has chronic pain issues. He is on 300 mcg of fentanyl patches daily, valium, oxycodone.  His assessment was delayed as he was initially triaged as an acuity 4 knee pain.  On exam patient has deformity and edema to his left proximal lower leg. Compartments soft on initial assessment. Intact distal pulses. ABCs intact. No C-spine pain, step-off or deformity. No abdominal pain or chest pain. Some low back pain in the midline.  Patient's x-rays were delayed images were not crossing over. Remarkable for a proximal tibia/fibula fracture. In the course of the ED stay patient's LLE compartments became more firm and tense. This was discussed with Dr. Jillyn Hidden the on call orthopedist who measured compartment pressures and found it to be greater than 90. Plan for emergent fasciotomy initiated. , Imaging remarkable for transverse process fractures of the lumbar spine, posterior rib fractures and questionable clavicle fracture. No intra-abdominal process identified Dr. gross of trauma surgery contacted to evaluate patient as Dr. Jillyn Hidden took him to the OR.  CRITICAL CARE Performed by: Glynn Octave   Total critical care time: 45  Critical care time was exclusive of separately billable procedures and treating other patients.  Critical care was necessary to treat or prevent imminent or life-threatening deterioration.  Critical care was time spent personally by me on the following activities: development of treatment plan with patient and/or surrogate as well as nursing, discussions with consultants, evaluation of patient's response to treatment, examination of patient,  obtaining history from patient or surrogate, ordering and performing treatments and interventions, ordering and review of laboratory studies, ordering and review of radiographic studies, pulse oximetry and re-evaluation of patient's condition.   Glynn Octave, MD 08/31/12 1125

## 2012-08-31 NOTE — Anesthesia Postprocedure Evaluation (Signed)
  Anesthesia Post-op Note  Patient: [name]  Procedure(s) Performed: Procedure(s): FASCIOTOMY (Left)  Patient Location: PACU  Anesthesia Type:General  Level of Consciousness: awake, alert  and oriented  Airway and Oxygen Therapy: Patient Spontanous Breathing and Patient connected to nasal cannula oxygen  Post-op Pain: none  Post-op Assessment: Post-op Vital signs reviewed, Patient's Cardiovascular Status Stable, Respiratory Function Stable, Patent Airway, No signs of Nausea or vomiting, Adequate PO intake and Pain level controlled  Post-op Vital Signs: Reviewed and stable  Complications: No apparent anesthesia complications

## 2012-08-31 NOTE — Progress Notes (Signed)
UR completed 

## 2012-08-31 NOTE — OR Nursing (Signed)
Xeroform gauze packed in wounds, secured with Blue vessel loop and skin staples

## 2012-08-31 NOTE — Progress Notes (Signed)
Patient transferred to [facility].  All belongings with patient.  Patient agreeable to transfer. As per PA Charma Igo, transfer orders will be addressed upon arrival to [place].  Report given to [facility] on [facility] at [employer]. No further questions at this time.  Patient escorted off unit via stretcher by care link.  Patient transferred.

## 2012-08-31 NOTE — Progress Notes (Signed)
Patient ID: [name], male   DOB: 1960-08-16, 52 y.o.   MRN: [id number] Spoke with nurse Darrick Penna and patient and mother. Patient doing well. Dr. Dion Saucier to ORIF Thursday. Also changed dressing today. Any further  Questions of care  please contact Dr. Dion Saucier or Dr. Carola Frost Thank you.

## 2012-08-31 NOTE — Progress Notes (Signed)
Patient ID: EMILEO SEMEL, male   DOB: 11/19/1960, 52 y.o.   MRN: [id number]   LOS: 1 day   Subjective: C/o pain, doesn't remember fall or surgery. Requesting home dose of Duragesic ( ).   Objective: Vital signs in last 24 hours: Temp:  [97.7 F (36.5 C)-98.6 F (37 C)] 98 F (36.7 C) (03/10 0600) Pulse Rate:  [85-101] 98 (03/10 0600) Resp:  [12-19] 16 (03/10 0600) BP: (150-178)/(78-119) 150/82 mmHg (03/10 0700) SpO2:  [97 %-100 %] 100 % (03/10 0600) Weight:  [158 lb 4.6 oz (71.8 kg)-160 lb (72.576 kg)] 158 lb 4.6 oz (71.8 kg) (03/10 0315)     Laboratory  CBC  Recent Labs  08/30/12 1938 08/31/12 0308  WBC 27.4* 21.1*  HGB 12.0* 11.0*  HCT 35.6* 33.6*  PLT 343 311   BMET  Recent Labs  08/30/12 1938 08/31/12 0308  NA 132* 136  K 3.6 4.2  CL 96 104  CO2 24 25  GLUCOSE 169* 150*  BUN 6 6  CREATININE 0.85 0.82  CALCIUM 8.9 8.5     Physical Exam General appearance: alert and no distress Resp: clear to auscultation bilaterally Cardio: regular rate and rhythm GI: Soft, mild-to-mod TTP LUQ, +BS Extremities: NVI   Assessment/Plan: Fall Right rib fxs x2 -- Pain control and pulmonary toilet Lumbar TVP fxs Left proximal tib/fib fx s/p fasciotomy Multiple medical problems -- Home meds FEN -- Pain control will likely be a problem given tolerance. Give diet, SL IV, d/c foley. VTE -- Plexipulse, Lovenox Dispo -- PT/OT, OR possibly on Thursday   Freeman Caldron, PA-C Pager: [phone] General Trauma PA Pager: [phone]   08/31/2012

## 2012-08-31 NOTE — Transfer of Care (Signed)
Immediate Anesthesia Transfer of Care Note  Patient: [name]  Procedure(s) Performed: Procedure(s): FASCIOTOMY (Left)  Patient Location: PACU  Anesthesia Type:General  Level of Consciousness: awake, alert  and patient cooperative  Airway & Oxygen Therapy: Patient Spontanous Breathing and Patient connected to face mask oxygen  Post-op Assessment: Report given to PACU RN, Post -op Vital signs reviewed and stable and Patient moving all extremities X 4  Post vital signs: stable  Complications: No apparent anesthesia complications

## 2012-08-31 NOTE — Progress Notes (Signed)
Pt admitted from PACU. Pt alert and orientated x1 with vital signs stable. Pt orientated to the unit and placed on cardiac monitoring. Pt denies any pain.Bed alarm placed on pt. New orders written and initiated.

## 2012-08-31 NOTE — Progress Notes (Signed)
Patient ID: [name], male   DOB: 03-17-1961, 52 y.o.   MRN: [id number] Required 2nd OR team search  as no backup available. Also explored [facility] OR and carelink. OR at [facility] felt best option for timing. Plan fasciotomy emergent and delayed ORIF. Discussed with family.

## 2012-09-01 DIAGNOSIS — W19XXXA Unspecified fall, initial encounter: Secondary | ICD-10-CM

## 2012-09-01 MED ORDER — PNEUMOCOCCAL VAC POLYVALENT 25 MCG/0.5ML IJ INJ
0.5000 mL | INJECTION | INTRAMUSCULAR | Status: AC
  Filled 2012-09-01: qty 0.5

## 2012-09-01 NOTE — Op Note (Signed)
NAMECAYSEN, WHANG               ACCOUNT NO.:  [id number]  MEDICAL RECORD NO.:  [id number]  LOCATION:  5N02C                        FACILITY:  [facility]  PHYSICIAN:  Jene Every, M.D.    DATE OF BIRTH:  03/15/1961  DATE OF PROCEDURE:  08/31/2012 DATE OF DISCHARGE:                              OPERATIVE REPORT   PREOPERATIVE DIAGNOSIS:  Compartment syndrome of the left leg.  POSTOPERATIVE DIAGNOSIS:  Compartment syndrome of the left leg.  PROCEDURE PERFORMED:  Double-incision fasciotomy of left leg.  ANESTHESIA:  General.  ASSISTANT:  None.  HISTORY:  This is a 52 year old, sustaining a proximal tibia fracture with fibular fracture, fell at noon today.  Was seen in the emergency room initially, degree of trauma unappreciated in triage.  The patient had subsequent workup, which indicated some rib fractures, transverse process fractures.  Possible clavicle fracture.  I was called for evaluation.  The proximal tibia fracture was closed, reported the compartments were soft.  I evaluated the patient in the emergency room, and his clinical presentation revealed the patient was somewhat somnolent.  He had very tense compartments, both posteriorly, anteriorly, and laterally.  He did have pulses, but he had pain with passive stretching.  I performed compartment pressure testing in the emergency room and found at least 85-90 mmHg in the anterior, lateral, and posterior compartments, so therefore, it was felt he was indicated for emergent fasciotomy.  There initially was emergency in the operating room and therefore room was unavailable.  We therefore solicited __________ transfer of the patient to [facility] for possible fasciotomy as well as to discuss the transfer.  However, __________ secured and it was felt expeditious to keep the patient at [facility] and perform the fasciotomies.  I did feel, however, at this time that the instrumentation for a complex reconstruction of lateral  tibial plateau fracture would not be indicated at this particular time, and a staged procedure would best benefit the patient in multiple indications.  We discussed risks and benefits including bleeding, infection, damage to neurovascular structures, DVT, PE, anesthetic complications, etc.  TECHNIQUE:  With the patient in supine position, after induction of adequate anesthesia, 900 mg of clindamycin, the left lower extremity was prepped draped in the usual sterile fashion.  First, between the fibula and the tibia, an incision was made along the lateral aspect of the calf, bisecting those two through the skin only.  I stayed far away from the fracture site but did feel that if we incorporated the incision, we could extend it proximally for an extensile approach to the proximal tibia.  I found the fascia of the anterior and lateral compartments and first incised the fascia of the anterior compartment just a small incision through a small area of the fascia, and then I placed hemostat beneath that and extended the fasciotomy meticulously and carefully under direct visualization.  I then placed a digit beneath the fascia and protected the muscle and neurovascular structures.  Extended the fasciotomy distally and proximally.  In a similar fashion, I made a small incision in the lateral compartment fascia and performed the same fasciotomy distally and proximally and full release of the compartment was appreciated.  Again,  we protected the neurovascular structures posteriorly with the digit.  The superficial peroneal nerve was protected at all times.  Electrocautery was utilized to achieve hemostasis.  The muscle appeared to be contractile.  There was bleeding tissue noted, some slightly darkened areas.  However, they were contractile and bleeding.  I felt the tissue was therefore viable.  We then copiously irrigated that wound.  We then made an incision medially just 2 cm off the medial aspect  of the tibial crest.  I made a smaller incision in the posterior compartment.  These all measured 85-90 mmHg with a Stryker compartment pressure monitoring device in the emergency room.  Fascia of the posterior compartment was released in a similar fashion, distally and proximally, protecting neurovascular structures. Also the deep posterior compartment was released as well.  Wounds were copiously irrigated.  Then I placed Xeroform over the open wounds and then closed them with vessel loops and staples in a mattress fashion for slight retention.  They were then covered with Mepilex and an Ace bandage.  Placed in an immobilizer, good pulses following this.  Prior to dressing, we measured the compartment pressures.  They were 0-3 utilizing a pressure transducer from the arterial monitoring system.  Next, the patient was extubated without difficulty and transported to recovery room in satisfactory condition.  The patient tolerated the procedure well.  No complications.  Minimal blood loss.     Jene Every, M.D.     Cordelia Pen  D:  08/31/2012  T:  08/31/2012  Job:  [id number]

## 2012-09-01 NOTE — Clinical Social Work Psychosocial (Addendum)
Clinical Social Work Department BRIEF PSYCHOSOCIAL ASSESSMENT 09/01/2012  Patient:  [name], [name]     Account Number:  [id number]     Admit date:  08/30/2012  Clinical Social Worker:  Johnsie Cancel  Date/Time:  09/01/2012 04:51 PM  Referred by:  Physician  Date Referred:  09/01/2012 Referred for  Crisis Intervention   Other Referral:   Interview type:  Family Other interview type:    PSYCHOSOCIAL DATA Living Status:  PARENTS  Primary support name:  [name] [phone]) Primary support relationship to patient:  PARENT Degree of support available:   Strong.    CURRENT CONCERNS Current Concerns  Post-Acute Placement  Substance Abuse   Other Concerns:    SOCIAL WORK ASSESSMENT / PLAN CSW consulted by trauma service re: SBIRT, psychosocial and disposition needs. CSW went to patient's bedside to complete SBIRTand due to patient's AMS was unable to complete. Patient's mother completed the SBIRT on patient's behalf.    CSW contacted the patient's mother, [name] [phone]) to discuss the accident. CSW introduced self, explained role, and provided support. Patient's mother stated she did not believe the patient was using any legal or illegal substances or alcohol. Patient's mother stated patient has been receiving excellent care in the hospital. Patient's mother stated she will take the patient home when he is medically stable. CSW is signing off as no other psychosocial concerns are present. Please re-consult as needed.   Assessment/plan status:  Information/Referral to [facility] Other assessment/ plan:   Information/referral to community resources:    PATIENT'S/FAMILY'S RESPONSE TO PLAN OF CARE: Patient's mother thanked CSW for contacting her about disposition and providing support.   Lia Foyer, LCSWA [facility] Clinical Social Worker Contact #: [phone]

## 2012-09-01 NOTE — Progress Notes (Signed)
Dressings changed yesterday and wounds evaluated.  Fasciotomy sites covered with xeroform, retention sutures in place.  Still significant swelling.  Exam today: EHL and FHL are intact, minimal pain with AROM and PROM of toes and ankle.  Able to evert foot slightly, denies any significant numbness in his foot.    A/P: Principal Problem:   Traumatic compartment syndrome of left leg Active Problems:   Cirrhosis   Hepatitis C   Fracture of ribs, right 11 & 12   Closed fracture of transverse process of lumbar vertebra L2 & L3   Tibia/fibula fracture   Fall  He will need delayed staged primary closure and possible grafting of the anterior if unable to get closed.  Plan for I&D and possible closure of medial side thurs.  Definitive fixation may not be possible until next week depending on soft tissue.  This will be coordinated between myself and Dr. Carola Frost.

## 2012-09-01 NOTE — Progress Notes (Signed)
Patient ID: CAL GINDLESPERGER, male   DOB: February 17, 1961, 52 y.o.   MRN: [id number]   LOS: 2 days   Subjective: Really craving cigarette. Otherwise doing ok. Plan for OR tomorrow with Dion Saucier.  Objective: Vital signs in last 24 hours: Temp:  [98.2 F (36.8 C)-98.7 F (37.1 C)] 98.7 F (37.1 C) (03/11 0638) Pulse Rate:  [98-108] 105 (03/11 0638) Resp:  [16-18] 18 (03/11 0638) BP: (133-144)/(68-77) 133/68 mmHg (03/11 0638) SpO2:  [97 %-98 %] 97 % (03/11 4098)    IS:   Physical Exam General appearance: alert and no distress but tremulous Resp: clear to auscultation bilaterally Cardio: regular rate and rhythm GI: Soft, +BS Extremities: NVI   Assessment/Plan: Fall  Right rib fxs x2 -- Pain control and pulmonary toilet  Lumbar TVP fxs  Left proximal tib/fib fx s/p fasciotomy  Multiple medical problems -- Home meds  FEN -- No issues. Recommended valium for nicotine cravings, explained nicotine replacement contraindicated with fracture. VTE -- Plexipulse, Lovenox  Dispo -- PT/OT, OR tomorrow    Freeman Caldron, PA-C Pager: [phone] General Trauma PA Pager: [phone]   09/01/2012

## 2012-09-01 NOTE — ED Provider Notes (Signed)
Medical screening examination/treatment/procedure(s) were conducted as a shared visit with non-physician practitioner(s) and myself.  I personally evaluated the patient during the encounter  See my additional note  Glynn Octave, MD 09/01/12 1025

## 2012-09-01 NOTE — Progress Notes (Signed)
Occupational Therapy Evaluation Patient Details Name: [name] MRN: [id number] DOB: 1961/03/27 Today's Date: 09/01/2012 Time: 0865-7846 OT Time Calculation (min): 36 min  OT Assessment / Plan / Recommendation Clinical Impression  52 yo s/p fall down steps at home with L tib/fib injury with compartment syndrome, lumber transverse process fxs and rib fxs. PTA, pt lived in multilevel house with mother who helped to care for him. Pt states he was mod i with use of cane and that his mother helped as needed with ADL. Was unable to contact family to confirm information. Pt states that he has 15 STE, but that friends are building a ramp. If steps are not a barrier to D/C and pt has 24/7 S, then he will most likely be able to D/C home with family with 24/7 S. Will benefit from acute OT services to max independence with ADL and functional moiblity for ADL to facilitate D/C home with [facility] services.    OT Assessment  Patient needs continued OT Services    Follow Up Recommendations  Home health OT    Barriers to Discharge Other (comment) (? accessibility)    Equipment Recommendations  None recommended by OT    Recommendations for Other Services    Frequency  Min 3X/week    Precautions / Restrictions Precautions Precautions: Fall Precaution Comments: immobilizer LLE Restrictions Weight Bearing Restrictions: Yes LLE Weight Bearing: Non weight bearing   Pertinent Vitals/Pain 5. LLE. nsg aware    ADL  Grooming: Minimal assistance Upper Body Bathing: Minimal assistance Where Assessed - Upper Body Bathing: Supported sitting Lower Body Bathing: Maximal assistance Where Assessed - Lower Body Bathing: Supported sit to stand Upper Body Dressing: Minimal assistance Where Assessed - Upper Body Dressing: Supported sitting Lower Body Dressing: Maximal assistance Where Assessed - Lower Body Dressing: Supported sit to Pharmacist, hospital: Moderate assistance Toilet Transfer Method: Stand pivot  (bed - chair) Toileting - Clothing Manipulation and Hygiene: Maximal assistance Where Assessed - Toileting Clothing Manipulation and Hygiene: Sit to stand from 3-in-1 or toilet Equipment Used: Gait belt Transfers/Ambulation Related to ADLs: Mod A stad pivot ADL Comments: may benefit from AE    OT Diagnosis: Generalized weakness;Acute pain;Other (comment) (? cognition)  OT Problem List: Decreased strength;Decreased range of motion;Decreased activity tolerance;Impaired balance (sitting and/or standing);Decreased safety awareness;Decreased knowledge of use of DME or AE;Decreased knowledge of precautions;Pain OT Treatment Interventions: Self-care/ADL training;Therapeutic exercise;Energy conservation;DME and/or AE instruction;Therapeutic activities;Patient/family education;Balance training   OT Goals Acute Rehab OT Goals OT Goal Formulation: With patient Time For Goal Achievement: 09/15/12 Potential to Achieve Goals: Good  Visit Information  Last OT Received On: 09/01/12 Assistance Needed: +2    Subjective Data      Prior Functioning     Home Living Lives With: Family Available Help at Discharge: Available 24 hours/day Type of Home: House Home Access: Stairs to enter;Other (comment);Ramped entrance Entrance Stairs-Number of Steps: 15 Entrance Stairs-Rails: Can reach both Home Layout: Two level Bathroom Shower/Tub: Tub/shower unit;Walk-in shower Bathroom Toilet: Standard Home Adaptive Equipment: Hospital bed;Bedside commode/3-in-1;Shower chair with back;Walker - rolling;Wheelchair - manual Additional Comments: ??per pt report. unsure of accuracy Prior Function Level of Independence: Needs assistance Needs Assistance: Dressing;Meal Prep;Light Housekeeping;Bathing Meal Prep: Maximal Light Housekeeping: Maximal Driving: No Vocation: Full time employment Comments: used cane for ambulation Communication Communication: Expressive difficulties         Vision/Perception      Cognition  Cognition Overall Cognitive Status: History of cognitive impairments - at baseline Arousal/Alertness: Awake/alert Orientation Level: Appears  intact for tasks assessed Behavior During Session: Other (comment) (eccentric)    Extremity/Trunk Assessment Right Upper Extremity Assessment RUE ROM/Strength/Tone: [facility] for tasks assessed Left Upper Extremity Assessment LUE ROM/Strength/Tone: Deficits (state past shouder  from previous injury) Right Lower Extremity Assessment RLE ROM/Strength/Tone: [facility] for tasks assessed (past hip surgery) Left Lower Extremity Assessment LLE ROM/Strength/Tone: Unable to fully assess;Due to precautions Trunk Assessment Trunk Assessment: Normal;Other exceptions (keeps head in R cervical rotation. ? past injury)     Mobility Bed Mobility Bed Mobility: Rolling Left;Left Sidelying to Sit;Sitting - Scoot to Edge of Bed Rolling Left: 4: Min assist Left Sidelying to Sit: 3: Mod assist;HOB flat Sitting - Scoot to Edge of Bed: 4: Min assist Details for Bed Mobility Assistance: vc and manual faciliation . used log rolling technique due to lumbar fxs. Transfers Transfers: Sit to Stand;Stand to Sit Sit to Stand: 3: Mod assist;With upper extremity assist;From bed Stand to Sit: 3: Mod assist;With upper extremity assist;To chair/3-in-1 Details for Transfer Assistance: vc for technique and safety     Exercise     Balance Balance Balance Assessed:  (unsteady in static standing . ?tremors)   End of Session OT - End of Session Equipment Utilized During Treatment: Gait belt Activity Tolerance: Patient tolerated treatment well Patient left: in chair;with call bell/phone within reach Nurse Communication: Mobility status;Other (comment) (request for valium)  GO     [name],[name] 09/01/2012, 2:33 PM [facility], OTR/L  [phone] 09/01/2012

## 2012-09-01 NOTE — Evaluation (Signed)
Physical Therapy Evaluation Patient Details Name: [name] MRN: [id number] DOB: 03-09-1961 Today's Date: 09/01/2012 Time: 4098-1191 PT Time Calculation (min): 17 min  PT Assessment / Plan / Recommendation Clinical Impression  52 yo s/p fall down steps at home with L tib/fib injury with compartment syndrome, lumber transverse process fxs and rib fxs. PTA, pt lived in multilevel house with mother who helped to care for him. Pt states he was mod i with use of cane and that his mother helped as needed with ADL. Was unable to contact family to confirm information. Pt stase he has 15 STE, but that friends are building a ramp. If steps are not a barrier to D/C and pt has 24/7 S, then he will most likely be able to D/C home with family. Will benefit from Acute PT to facilitate c/c to home with HHPT.     PT Assessment       Follow Up Recommendations  Home health PT;Supervision/Assistance - 24 hour    Does the patient have the potential to tolerate intense rehabilitation      Barriers to Discharge        Equipment Recommendations  Rolling walker with 5" wheels    Recommendations for Other Services     Frequency      Precautions / Restrictions Precautions Precautions: Fall Precaution Comments: immobilizer LLE Restrictions Weight Bearing Restrictions: Yes LLE Weight Bearing: Non weight bearing   Pertinent Vitals/Pain 4/10 pain in LLE and ribs with activity.  Pt medicated prior to session.       Mobility  Bed Mobility Bed Mobility: Not assessed Transfers Transfers: Sit to Stand;Stand to Sit Sit to Stand: 4: Min guard;From chair/3-in-1;With upper extremity assist Stand to Sit: 4: Min guard;To chair/3-in-1;With upper extremity assist Details for Transfer Assistance: vc for technique, safety and to maintain NWB on LLE.   Ambulation/Gait Ambulation/Gait Assistance: 4: Min guard Ambulation Distance (Feet): 20 Feet Assistive device: Rolling walker Ambulation/Gait Assistance  Details: VCs for NWB on LLE Gait Pattern: Step-to pattern Stairs: No    Exercises     PT Diagnosis:    PT Problem List:   PT Treatment Interventions:     PT Goals Acute Rehab PT Goals PT Goal Formulation: With patient Time For Goal Achievement: 09/08/12 Potential to Achieve Goals: Good Pt will go Supine/Side to Sit: with modified independence PT Goal: Supine/Side to Sit - Progress: Goal set today Pt will go Sit to Supine/Side: with modified independence PT Goal: Sit to Supine/Side - Progress: Goal set today Pt will Transfer Bed to Chair/Chair to Bed: with supervision PT Transfer Goal: Bed to Chair/Chair to Bed - Progress: Goal set today Pt will Ambulate: 16 - 50 feet;with supervision;with least restrictive assistive device PT Goal: Ambulate - Progress: Goal set today Pt will Go Up / Down Stairs: Flight;with mod assist;with least restrictive assistive device PT Goal: Up/Down Stairs - Progress: Goal set today  Visit Information  Last PT Received On: 09/01/12 Assistance Needed: +2    Subjective Data  Subjective: agree to PT EVAL.   Prior Functioning  Home Living Lives With: Family Available Help at Discharge: Available 24 hours/day Type of Home: House Home Access: Stairs to enter;Other (comment);Ramped entrance Entrance Stairs-Number of Steps: 15 Entrance Stairs-Rails: Can reach both Home Layout: Two level Bathroom Shower/Tub: Tub/shower unit;Walk-in shower Bathroom Toilet: Standard Home Adaptive Equipment: Hospital bed;Bedside commode/3-in-1;Shower chair with back;Walker - rolling;Wheelchair - manual Additional Comments: ??per pt report. unsure of accuracy Prior Function Level of Independence: Needs assistance Needs  Assistance: Dressing;Meal Prep;Light Housekeeping;Bathing Meal Prep: Maximal Light Housekeeping: Maximal Driving: No Vocation: Full time employment Communication Communication: Expressive difficulties    Cognition  Cognition Overall Cognitive  Status: History of cognitive impairments - at baseline Arousal/Alertness: Awake/alert Orientation Level: Appears intact for tasks assessed    Extremity/Trunk Assessment Right Upper Extremity Assessment RUE ROM/Strength/Tone: [facility] for tasks assessed Left Upper Extremity Assessment LUE ROM/Strength/Tone: Deficits (LImited ROM in shoulder from old injury./ ) LUE ROM/Strength/Tone Deficits: Limited ROM in shoulder from old injury.   Right Lower Extremity Assessment RLE ROM/Strength/Tone: [facility] for tasks assessed Left Lower Extremity Assessment LLE ROM/Strength/Tone: Unable to fully assess;Due to precautions   Balance    End of Session PT - End of Session Equipment Utilized During Treatment: Gait belt;Left knee immobilizer Activity Tolerance: Patient tolerated treatment well Patient left: in chair;with call bell/phone within reach Nurse Communication: Mobility status  GP     Tish Begin 09/01/2012, 5:23 PM Alcario Tinkey L. Prestyn Mahn DPT [phone]

## 2012-09-01 NOTE — Progress Notes (Signed)
Appreciative of care. Pain control is good. Noted plan for surgery tomorrow by Dr. Dion Saucier. Patient examined and I agree with the assessment and plan  Violeta Gelinas, MD, MPH, FACS Pager: [phone]  09/01/2012 12:15 PM

## 2012-09-02 ENCOUNTER — Inpatient Hospital Stay (HOSPITAL_COMMUNITY): Payer: Medicaid Other

## 2012-09-02 LAB — COMPREHENSIVE METABOLIC PANEL
ALT: 15 U/L (ref 0–53)
AST: 27 U/L (ref 0–37)
Albumin: 2.9 g/dL — ABNORMAL LOW (ref 3.5–5.2)
Calcium: 9 mg/dL (ref 8.4–10.5)
Sodium: 133 mEq/L — ABNORMAL LOW (ref 135–145)
Total Protein: 6.6 g/dL (ref 6.0–8.3)

## 2012-09-02 LAB — CBC
HCT: 29 % — ABNORMAL LOW (ref 39.0–52.0)
Hemoglobin: 9.8 g/dL — ABNORMAL LOW (ref 13.0–17.0)
MCH: 30.5 pg (ref 26.0–34.0)
MCHC: 33.8 g/dL (ref 30.0–36.0)

## 2012-09-02 LAB — SURGICAL PCR SCREEN: Staphylococcus aureus: NEGATIVE

## 2012-09-02 MED ORDER — MIDAZOLAM HCL 2 MG/2ML IJ SOLN: 1.0000 mg | INTRAMUSCULAR | Status: DC | PRN

## 2012-09-02 MED ORDER — FENTANYL CITRATE 0.05 MG/ML IJ SOLN: 50.0000 ug | INTRAMUSCULAR | Status: DC | PRN

## 2012-09-02 MED ORDER — DULOXETINE HCL 60 MG PO CPEP
60.0000 mg | ORAL_CAPSULE | ORAL | Status: DC
  Administered 2012-09-02 – 2012-09-05 (×7): 60 mg via ORAL
  Filled 2012-09-02 (×8): qty 1

## 2012-09-02 MED ORDER — BUPROPION HCL ER (XL) 150 MG PO TB24
150.0000 mg | ORAL_TABLET | ORAL | Status: DC
  Administered 2012-09-03 – 2012-09-05 (×3): 150 mg via ORAL
  Filled 2012-09-02 (×4): qty 1

## 2012-09-02 MED ORDER — LACTATED RINGERS IV SOLN
INTRAVENOUS | Status: DC
  Administered 2012-09-03: 50 mL/h via INTRAVENOUS

## 2012-09-02 MED ORDER — LITHIUM CARBONATE 300 MG PO CAPS
300.0000 mg | ORAL_CAPSULE | ORAL | Status: DC
  Administered 2012-09-02 – 2012-09-05 (×9): 300 mg via ORAL
  Filled 2012-09-02 (×13): qty 1

## 2012-09-02 NOTE — Progress Notes (Signed)
     Subjective:  Patient reports pain as moderate.  He is concerned about his pets, and wants to go home and feed them. His mother is at the bedside.  Objective:   VITALS:   Filed Vitals:   09/01/12 0638 09/01/12 1434 09/01/12 2200 09/02/12 0600  BP: 133/68 116/87 111/69 135/80  Pulse: 105 116 114 108  Temp: 98.7 F (37.1 C) 98.1 F (36.7 C) 99.3 F (37.4 C) 97.9 F (36.6 C)  TempSrc:      Resp: 18 20 18 18   Height:      Weight:      SpO2: 97% 99% 100% 93%    Neurologically intact Dorsiflexion/Plantar flexion intact   Lab Results  Component Value Date   WBC 21.1* 08/31/2012   HGB 11.0* 08/31/2012   HCT 33.6* 08/31/2012   MCV 91.3 08/31/2012   PLT 311 08/31/2012     Assessment/Plan: 3 Days Post-Op   Principal Problem:   Traumatic compartment syndrome of left leg Active Problems:   Cirrhosis   Hepatitis C   Fracture of ribs, right 11 & 12   Closed fracture of transverse process of lumbar vertebra L2 & L3   Tibia/fibula fracture   Fall   Advance diet N.p.o. after midnight. We are planning to go to surgery tomorrow for debridement of his fasciotomy wounds, possible closure of the medial side, possible application of wound VAC to the lateral side. He is going to need definitive fixation probably some time next week. Dr. handy is planning on doing that, and I will plan on his surgery tomorrow.   He is not demonstrating any signs of recurrent compartment syndrome clinically, as ankle dorsiflexion is intact, and he plantar flexion of his great toe is also intact, and everted and seems to be present.   LANDAU,JOSHUA P 09/02/2012, 2:10 PM   Teryl Lucy, MD Cell [phone] Pager [phone]

## 2012-09-02 NOTE — Progress Notes (Signed)
Physical Therapy Treatment Patient Details Name: [name] MRN: [id number] DOB: 10-20-1960 Today's Date: 09/02/2012 Time: 4098-1191 PT Time Calculation (min): 48 min  PT Assessment / Plan / Recommendation Comments on Treatment Session  Pt presents with limited active and passive ROM in R gleno-humeral joint flexion,  External rotation,  abduction and Horizontal Adduction.   Pt presents with tendernss to palpation of right A-C joint and pain with cervical extension.  Pt presents with right lateral flexion and rotation of cervical spine and is unable to self correct.  Pt also unable to maintain neutral cervical position.   Pt will benefit from further MD eval of R shoulder.  Pt may also benefit from  Aspen collar to maintain neutal cervical position and avoid soft tissue contractures. Pt schedule for surgery tomorrow.      Follow Up Recommendations  Home health PT;Supervision/Assistance - 24 hour     Does the patient have the potential to tolerate intense rehabilitation     Barriers to Discharge        Equipment Recommendations  Rolling walker with 5" wheels    Recommendations for Other Services    Frequency Min 5X/week   Plan Discharge plan remains appropriate;Frequency remains appropriate    Precautions / Restrictions Precautions Precautions: Fall Precaution Comments: immobilizer LLE Restrictions Weight Bearing Restrictions: Yes LLE Weight Bearing: Non weight bearing   Pertinent Vitals/Pain 6/10 pain in LLE with activity. Pt did not require pain med refill    Mobility  Bed Mobility Bed Mobility: Supine to Sit Supine to Sit: HOB elevated;4: Min assist Sitting - Scoot to Edge of Bed: 5: Supervision Details for Bed Mobility Assistance: Pt able to manage LLL.  Assist to raise trunk secondary to rib pain.   Transfers Transfers: Sit to Stand;Stand to Sit Sit to Stand: 4: Min guard;From bed;With upper extremity assist Stand to Sit: 4: Min guard;To chair/3-in-1;With upper  extremity assist Details for Transfer Assistance: vc for technique, safety and to maintain NWB on LLE.   Ambulation/Gait Ambulation/Gait Assistance: 4: Min guard Ambulation Distance (Feet): 40 Feet Assistive device: Rolling walker Ambulation/Gait Assistance Details: VCs for gait sequencing.   Gait Pattern: Step-to pattern Stairs: No    Exercises General Exercises - Lower Extremity Straight Leg Raises: 10 reps;Left;AROM;Seated   PT Diagnosis:    PT Problem List:   PT Treatment Interventions:     PT Goals Acute Rehab PT Goals PT Goal Formulation: With patient Time For Goal Achievement: 09/08/12 Potential to Achieve Goals: Good Pt will go Supine/Side to Sit: with modified independence PT Goal: Supine/Side to Sit - Progress: Progressing toward goal Pt will go Sit to Supine/Side: with modified independence PT Goal: Sit to Supine/Side - Progress: Progressing toward goal Pt will Transfer Bed to Chair/Chair to Bed: with supervision PT Transfer Goal: Bed to Chair/Chair to Bed - Progress: Progressing toward goal Pt will Ambulate: 16 - 50 feet;with supervision;with least restrictive assistive device PT Goal: Ambulate - Progress: Progressing toward goal Pt will Go Up / Down Stairs: Flight;with mod assist;with least restrictive assistive device  Visit Information  Last PT Received On: 09/02/12    Subjective Data  Subjective: my ribs hurt Patient Stated Goal: none stated.   Cognition  Cognition Overall Cognitive Status: History of cognitive impairments - at baseline Arousal/Alertness: Awake/alert Orientation Level: Appears intact for tasks assessed Behavior During Session: Other (comment)    Balance     End of Session PT - End of Session Equipment Utilized During Treatment: Gait belt;Left knee immobilizer  Activity Tolerance: Patient tolerated treatment well Patient left: in chair;with call bell/phone within reach Nurse Communication: Mobility status   GP      Smith,Brendan 09/02/2012, 1:22 PM [name] DPT [phone]

## 2012-09-02 NOTE — Progress Notes (Signed)
Pt was trying to ambulate without assistance to find a cigarette to smoke.  Pt made it to foot of bed, lost his balance, grabbed onto the footboard and gradually fell onto floor.  Dr. Corliss Skains with trauma notified, no further orders received.  Nsg to continue to monitor.

## 2012-09-02 NOTE — Progress Notes (Signed)
Pt has had HH in the past after a hip surgery "a couple of years ago" and said that he would use whatever [facility] agency he used for that.  Will research agency name in old records and start referral process.    Address and home number listed in EPIC are correct.  Mother's cell phone number is [phone].

## 2012-09-02 NOTE — Progress Notes (Signed)
Patient doing well.  Wants to go home prior to surgery tomorrow, but I think the patient needs to be on IV antibiotics.  Told him to discuss with Dr. Dion Saucier.  This patient has been seen and I agree with the findings and treatment plan.  Marta Lamas. Gae Bon, MD, FACS [phone] (pager) [phone] (direct pager) Trauma Surgeon

## 2012-09-02 NOTE — Progress Notes (Signed)
Patient ID: [name], male   DOB: 02-Aug-1960, 52 y.o.   MRN: [id number]   LOS: 3 days   Subjective: No new c/o. Inquiring about going home between surgeries.  Objective: Vital signs in last 24 hours: Temp:  [97.9 F (36.6 C)-99.3 F (37.4 C)] 97.9 F (36.6 C) (03/12 0600) Pulse Rate:  [108-116] 108 (03/12 0600) Resp:  [18-20] 18 (03/12 0600) BP: (111-135)/(69-87) 135/80 mmHg (03/12 0600) SpO2:  [93 %-100 %] 93 % (03/12 0600) Last BM Date:  (pta)   IS: (+261ml)   Physical Exam General appearance: alert and no distress Resp: clear to auscultation bilaterally Cardio: regular rate and rhythm GI: normal findings: bowel sounds normal and soft, non-tender Extremities: NVI   Assessment/Plan: Fall  Right rib fxs x2 -- Pain control and pulmonary toilet  Lumbar TVP fxs  Left proximal tib/fib fx s/p fasciotomy -- I&D, possible closure medial fasciotomy tomorrow by Dr. Dion Saucier Multiple medical problems -- Home meds  FEN -- No issues.  VTE -- Plexipulse, Lovenox  Dispo -- PT/OT, OR tomorrow. I told pt to discuss possibility of d/c home between surgeries with Dr. Leslee Home, PA-C Pager: [phone] General Trauma PA Pager: [phone]   09/02/2012

## 2012-09-02 NOTE — Progress Notes (Signed)
I was asked about patient's right shoulder and neck by PT who noted significant limitation in PROM/AROM and a propensity for rightward head turning. In speaking with the patient it seems this is a problem that predates his fall though he did misstate which shoulder had been broken previously and it was unclear if he was referring to problems with his left or right shoulder.   On exam there is no point TTP. The joint is mildly diffusely sore. He cannot abduct his arm past 90 without elevating his entire shoulder girdle. He does show a fairly pliant torticollis with rightward rotation and flexion.  I will get an x-ray of his shoulder to make sure we haven't missed a fracture. I suspect this is a muscular problem and ought to be amenable to PT.   Freeman Caldron, PA-C Pager: [phone] General Trauma PA Pager: [phone]

## 2012-09-03 ENCOUNTER — Inpatient Hospital Stay (HOSPITAL_COMMUNITY): Payer: Medicaid Other | Admitting: Anesthesiology

## 2012-09-03 ENCOUNTER — Encounter (HOSPITAL_COMMUNITY): Payer: Self-pay | Admitting: Anesthesiology

## 2012-09-03 ENCOUNTER — Inpatient Hospital Stay (HOSPITAL_COMMUNITY): Payer: Medicaid Other

## 2012-09-03 ENCOUNTER — Encounter (HOSPITAL_COMMUNITY): Admission: EM | Disposition: A | Discharge status: Home Health | Payer: Self-pay | Source: Home / Self Care

## 2012-09-03 HISTORY — PX: I & D EXTREMITY: SHX5045

## 2012-09-03 LAB — CBC
MCH: 30.7 pg (ref 26.0–34.0)
MCV: 91.4 fL (ref 78.0–100.0)
Platelets: 348 10*3/uL (ref 150–400)
RDW: 13 % (ref 11.5–15.5)
WBC: 15.5 10*3/uL — ABNORMAL HIGH (ref 4.0–10.5)

## 2012-09-03 LAB — CREATININE, SERUM
Creatinine, Ser: 0.74 mg/dL (ref 0.50–1.35)
GFR calc non Af Amer: >90 mL/min (ref >90)

## 2012-09-03 SURGERY — IRRIGATION AND DEBRIDEMENT EXTREMITY
Anesthesia: General | Site: Leg Lower | Laterality: Left | Wound class: Contaminated

## 2012-09-03 MED ORDER — CLINDAMYCIN PHOSPHATE 600 MG/50ML IV SOLN
INTRAVENOUS | Status: DC | PRN
  Administered 2012-09-03: 600 mg via INTRAVENOUS

## 2012-09-03 MED ORDER — METHOCARBAMOL 100 MG/ML IJ SOLN
500.0000 mg | Freq: Four times a day (QID) | INTRAVENOUS | Status: DC | PRN
  Filled 2012-09-03: qty 5

## 2012-09-03 MED ORDER — DOCUSATE SODIUM 100 MG PO CAPS
100.0000 mg | ORAL_CAPSULE | Freq: Two times a day (BID) | ORAL | Status: DC
  Administered 2012-09-03 – 2012-09-05 (×4): 100 mg via ORAL
  Filled 2012-09-03 (×6): qty 1

## 2012-09-03 MED ORDER — LACTATED RINGERS IV SOLN
INTRAVENOUS | Status: DC | PRN
  Administered 2012-09-03: 12:00:00 via INTRAVENOUS

## 2012-09-03 MED ORDER — SUCCINYLCHOLINE CHLORIDE 20 MG/ML IJ SOLN
INTRAMUSCULAR | Status: DC | PRN
  Administered 2012-09-03: 100 mg via INTRAVENOUS

## 2012-09-03 MED ORDER — SODIUM CHLORIDE 0.9 % IR SOLN
Status: DC | PRN
  Administered 2012-09-03: 6000 mL

## 2012-09-03 MED ORDER — SENNA 8.6 MG PO TABS
1.0000 | ORAL_TABLET | Freq: Two times a day (BID) | ORAL | Status: DC
  Administered 2012-09-03 – 2012-09-05 (×4): 8.6 mg via ORAL
  Filled 2012-09-03 (×5): qty 1

## 2012-09-03 MED ORDER — ZOLPIDEM TARTRATE 5 MG PO TABS: 5.0000 mg | ORAL_TABLET | Freq: Every evening | ORAL | Status: DC | PRN

## 2012-09-03 MED ORDER — MIDAZOLAM HCL 5 MG/5ML IJ SOLN
INTRAMUSCULAR | Status: DC | PRN
  Administered 2012-09-03: 2 mg via INTRAVENOUS

## 2012-09-03 MED ORDER — OXYCODONE HCL 5 MG/5ML PO SOLN: 5.0000 mg | Freq: Once | ORAL | Status: AC | PRN

## 2012-09-03 MED ORDER — METOCLOPRAMIDE HCL 10 MG PO TABS
5.0000 mg | ORAL_TABLET | Freq: Three times a day (TID) | ORAL | Status: DC | PRN
Start: 2012-09-03 — End: 2012-09-05

## 2012-09-03 MED ORDER — METOCLOPRAMIDE HCL 5 MG/ML IJ SOLN
5.0000 mg | Freq: Three times a day (TID) | INTRAMUSCULAR | Status: DC | PRN
Start: 2012-09-03 — End: 2012-09-05

## 2012-09-03 MED ORDER — MEPERIDINE HCL 25 MG/ML IJ SOLN: 6.2500 mg | INTRAMUSCULAR | Status: DC | PRN

## 2012-09-03 MED ORDER — ONDANSETRON HCL 4 MG/2ML IJ SOLN
INTRAMUSCULAR | Status: DC | PRN
  Administered 2012-09-03: 4 mg via INTRAVENOUS

## 2012-09-03 MED ORDER — OXYCODONE HCL 5 MG PO TABS: 5.0000 mg | ORAL_TABLET | Freq: Once | ORAL | Status: AC | PRN

## 2012-09-03 MED ORDER — PHENYLEPHRINE HCL 10 MG/ML IJ SOLN
INTRAMUSCULAR | Status: DC | PRN
  Administered 2012-09-03: 80 ug via INTRAVENOUS

## 2012-09-03 MED ORDER — METHOCARBAMOL 500 MG PO TABS: 500.0000 mg | ORAL_TABLET | Freq: Four times a day (QID) | ORAL | Status: DC | PRN

## 2012-09-03 MED ORDER — POLYETHYLENE GLYCOL 3350 17 G PO PACK: 17.0000 g | PACK | Freq: Every day | ORAL | Status: DC | PRN

## 2012-09-03 MED ORDER — PROMETHAZINE HCL 25 MG/ML IJ SOLN: 6.2500 mg | INTRAMUSCULAR | Status: DC | PRN

## 2012-09-03 MED ORDER — HYDROMORPHONE HCL PF 1 MG/ML IJ SOLN: 0.5000 mg | INTRAMUSCULAR | Status: DC | PRN

## 2012-09-03 MED ORDER — HYDROMORPHONE HCL PF 1 MG/ML IJ SOLN: 0.2500 mg | INTRAMUSCULAR | Status: DC | PRN

## 2012-09-03 MED ORDER — POTASSIUM CHLORIDE IN NACL 20-0.45 MEQ/L-% IV SOLN
INTRAVENOUS | Status: DC
  Administered 2012-09-04: 12:00:00 via INTRAVENOUS
  Filled 2012-09-03 (×6): qty 1000

## 2012-09-03 MED ORDER — ONDANSETRON HCL 4 MG PO TABS: 4.0000 mg | ORAL_TABLET | Freq: Four times a day (QID) | ORAL | Status: DC | PRN

## 2012-09-03 MED ORDER — PROPOFOL 10 MG/ML IV BOLUS
INTRAVENOUS | Status: DC | PRN
  Administered 2012-09-03: 180 mg via INTRAVENOUS

## 2012-09-03 MED ORDER — FENTANYL CITRATE 0.05 MG/ML IJ SOLN
INTRAMUSCULAR | Status: DC | PRN
  Administered 2012-09-03: 50 ug via INTRAVENOUS
  Administered 2012-09-03: 100 ug via INTRAVENOUS
  Administered 2012-09-03 (×2): 50 ug via INTRAVENOUS

## 2012-09-03 MED ORDER — ONDANSETRON HCL 4 MG/2ML IJ SOLN: 4.0000 mg | Freq: Four times a day (QID) | INTRAMUSCULAR | Status: DC | PRN

## 2012-09-03 MED ORDER — ENOXAPARIN SODIUM 40 MG/0.4ML ~~LOC~~ SOLN
40.0000 mg | SUBCUTANEOUS | Status: DC
  Administered 2012-09-04 (×2): 40 mg via SUBCUTANEOUS
  Filled 2012-09-03 (×4): qty 0.4

## 2012-09-03 MED ORDER — DIPHENHYDRAMINE HCL 12.5 MG/5ML PO ELIX: 12.5000 mg | ORAL_SOLUTION | ORAL | Status: DC | PRN

## 2012-09-03 MED ORDER — CLINDAMYCIN PHOSPHATE 600 MG/50ML IV SOLN
600.0000 mg | INTRAVENOUS | Status: DC
  Filled 2012-09-03: qty 50

## 2012-09-03 MED ORDER — SORBITOL 70 % SOLN: 30.0000 mL | Freq: Every day | Status: DC | PRN

## 2012-09-03 MED ORDER — LACTATED RINGERS IV SOLN: INTRAVENOUS | Status: DC

## 2012-09-03 SURGICAL SUPPLY — 43 items
BANDAGE ELASTIC 4 VELCRO ST LF (GAUZE/BANDAGES/DRESSINGS) ×2 IMPLANT
BANDAGE ELASTIC 6 VELCRO ST LF (GAUZE/BANDAGES/DRESSINGS) ×2 IMPLANT
BANDAGE GAUZE ELAST BULKY 4 IN (GAUZE/BANDAGES/DRESSINGS) ×2 IMPLANT
BNDG COHESIVE 4X5 TAN STRL (GAUZE/BANDAGES/DRESSINGS) ×1 IMPLANT
BOOTCOVER CLEANROOM LRG (PROTECTIVE WEAR) ×4 IMPLANT
CANISTER WOUND CARE 500ML ATS (WOUND CARE) ×1 IMPLANT
CLOTH BEACON ORANGE TIMEOUT ST (SAFETY) ×2 IMPLANT
CONNECTOR Y ATS VAC SYSTEM (MISCELLANEOUS) ×1 IMPLANT
COVER SURGICAL LIGHT HANDLE (MISCELLANEOUS) ×2 IMPLANT
CUFF TOURNIQUET SINGLE 34IN LL (TOURNIQUET CUFF) IMPLANT
DRSG VAC ATS MED SENSATRAC (GAUZE/BANDAGES/DRESSINGS) ×1 IMPLANT
DURAPREP 26ML APPLICATOR (WOUND CARE) ×2 IMPLANT
ELECT REM PT RETURN 9FT ADLT (ELECTROSURGICAL)
ELECTRODE REM PT RTRN 9FT ADLT (ELECTROSURGICAL) IMPLANT
EVACUATOR 1/8 PVC DRAIN (DRAIN) IMPLANT
GAUZE XEROFORM 1X8 LF (GAUZE/BANDAGES/DRESSINGS) ×1 IMPLANT
GLOVE BIOGEL PI ORTHO PRO SZ8 (GLOVE) ×1
GLOVE ORTHO TXT STRL SZ7.5 (GLOVE) ×2 IMPLANT
GLOVE PI ORTHO PRO STRL SZ8 (GLOVE) ×1 IMPLANT
GLOVE SURG ORTHO 8.0 STRL STRW (GLOVE) ×4 IMPLANT
GOWN STRL NON-REIN LRG LVL3 (GOWN DISPOSABLE) IMPLANT
HANDPIECE INTERPULSE COAX TIP (DISPOSABLE)
KIT BASIN OR (CUSTOM PROCEDURE TRAY) ×2 IMPLANT
KIT ROOM TURNOVER OR (KITS) ×2 IMPLANT
MANIFOLD NEPTUNE II (INSTRUMENTS) ×2 IMPLANT
NS IRRIG 1000ML POUR BTL (IV SOLUTION) ×2 IMPLANT
PACK ORTHO EXTREMITY (CUSTOM PROCEDURE TRAY) ×2 IMPLANT
PAD ARMBOARD 7.5X6 YLW CONV (MISCELLANEOUS) ×4 IMPLANT
PAD NEG PRESSURE SENSATRAC (MISCELLANEOUS) ×1 IMPLANT
SET CYSTO W/LG BORE CLAMP LF (SET/KITS/TRAYS/PACK) ×1 IMPLANT
SET HNDPC FAN SPRY TIP SCT (DISPOSABLE) IMPLANT
SPONGE GAUZE 4X4 12PLY (GAUZE/BANDAGES/DRESSINGS) ×1 IMPLANT
SPONGE LAP 18X18 X RAY DECT (DISPOSABLE) ×2 IMPLANT
STOCKINETTE IMPERVIOUS 9X36 MD (GAUZE/BANDAGES/DRESSINGS) ×1 IMPLANT
SUT ETHILON 3 0 PS 1 (SUTURE) IMPLANT
SUT ETHILON O TP 1 (SUTURE) ×1 IMPLANT
TOWEL OR 17X24 6PK STRL BLUE (TOWEL DISPOSABLE) ×2 IMPLANT
TOWEL OR 17X26 10 PK STRL BLUE (TOWEL DISPOSABLE) ×2 IMPLANT
TUBE ANAEROBIC SPECIMEN COL (MISCELLANEOUS) IMPLANT
TUBE CONNECTING 12X1/4 (SUCTIONS) ×2 IMPLANT
UNDERPAD 30X30 INCONTINENT (UNDERPADS AND DIAPERS) ×2 IMPLANT
WATER STERILE IRR 1000ML POUR (IV SOLUTION) ×2 IMPLANT
YANKAUER SUCT BULB TIP NO VENT (SUCTIONS) ×2 IMPLANT

## 2012-09-03 NOTE — Op Note (Signed)
08/30/2012 - 09/03/2012  1:30 PM  PATIENT:  [name]    PRE-OPERATIVE DIAGNOSIS:  compartment syndrome, status post 4 compartment fasciotomy  POST-OPERATIVE DIAGNOSIS:  Same  PROCEDURE:  Irrigation and debridement, left leg, skin, subcutaneous tissue, muscle, fascia, with delayed primary closure, simple, 9 cm incision medially, and partial closure of lateral incision, with application of wound VAC, 14 cm x 3 cm, this was placed laterally.  SURGEON:  Eulas Post, MD  ANESTHESIA:   General  PREOPERATIVE INDICATIONS:  [name] is a  52 y.o. male with a diagnosis of compartment syndrome who elected for surgical management.  He had previously had a fasciotomy performed, due to a proximal tibia fracture with compartment syndrome.  The risks benefits and alternatives were discussed with the patient preoperatively including but not limited to the risks of infection, bleeding, nerve injury, cardiopulmonary complications, the need for revision surgery, among others, and the patient was willing to proceed. We also discussed the risks of possible future surgical intervention, including the need for closure of the anterolateral side, as well as the future potential for open reduction internal fixation.  OPERATIVE IMPLANTS: Wound VAC, medium size  OPERATIVE FINDINGS: The medial side was soft and amenable to closure. The lateral side was still fairly swollen, and could not be closed. The anterior and lateral compartments were well decompressed.  OPERATIVE PROCEDURE: The patient was brought to the operating room and placed in supine position. IV clindamycin was given. The left lower extremity was prepped and draped in usual sterile fashion. Time out was performed. I used a Cobb elevator to debride the skin, subcutaneous tissue, and muscle and fascia. Once this had been completely debrided, I inspected the muscle, and all appeared to be viable. The medial side was soft. I could not access the  deep compartment. Clinically preoperatively he did not appear to have a compartment syndrome involving the deep posterior compartment.  I then irrigated a total of 6 L of fluid across both of the wounds. I closed the medial wound with 0 nylon, and began interrupted figure-of-eight sutures on the lateral side. Any soft tissue was still fairly swollen, and did not close easily, and was too tight. One of the sutures actually ripped through the skin. Therefore I abandoned attempt at closure, and placed a wound VAC. He may need grafting versus closure if possible some time next week.  He tolerated the procedure well and there were no complications. The wound VAC had excellent seal, and I did place a sponge over the closed wound as well, simply to minimize drainage, and a knee immobilizer was applied.

## 2012-09-03 NOTE — Progress Notes (Signed)
The patient has been re-examined, and the chart reviewed, and there have been no interval changes to the documented history and physical.    The risks, benefits, and alternatives have been discussed at length, and the patient is willing to proceed.     Plan for left leg fasciotomy closure, possible wound vac application.  I&D, leg.

## 2012-09-03 NOTE — Transfer of Care (Signed)
Immediate Anesthesia Transfer of Care Note  Patient: [name]  Procedure(s) Performed: Procedure(s): IRRIGATION AND DEBRIDEMENT EXTREMITY (Left)  Patient Location: PACU  Anesthesia Type:General  Level of Consciousness: awake and alert   Airway & Oxygen Therapy: Patient Spontanous Breathing and Patient connected to nasal cannula oxygen  Post-op Assessment: Report given to PACU RN, Post -op Vital signs reviewed and stable and Patient moving all extremities  Post vital signs: Reviewed and stable  Complications: No apparent anesthesia complications

## 2012-09-03 NOTE — Anesthesia Preprocedure Evaluation (Signed)
Anesthesia Evaluation  Patient identified by MRN, date of birth, ID band Patient awake    Reviewed: Allergy & Precautions, NPO status , Patient's Chart, lab work & pertinent test results  History of Anesthesia Complications Negative for: history of anesthetic complications  Airway Mallampati: II  Neck ROM: Full    Dental  (+) Edentulous Upper   Pulmonary COPD breath sounds clear to auscultation        Cardiovascular Rhythm:Regular Rate:Normal     Neuro/Psych negative neurological ROS     GI/Hepatic (+) Cirrhosis -       , Hepatitis -, C  Endo/Other  Hypothyroidism   Renal/GU      Musculoskeletal   Abdominal   Peds  Hematology negative hematology ROS (+)   Anesthesia Other Findings   Reproductive/Obstetrics                           Anesthesia Physical Anesthesia Plan  ASA: III  Anesthesia Plan: General   Post-op Pain Management:    Induction: Intravenous  Airway Management Planned: Oral ETT  Additional Equipment:   Intra-op Plan:   Post-operative Plan: Extubation in OR  Informed Consent: I have reviewed the patients History and Physical, chart, labs and discussed the procedure including the risks, benefits and alternatives for the proposed anesthesia with the patient or authorized representative who has indicated his/her understanding and acceptance.   Dental advisory given  Plan Discussed with: CRNA and Surgeon  Anesthesia Plan Comments:         Anesthesia Quick Evaluation

## 2012-09-03 NOTE — Progress Notes (Addendum)
Patient ID: [name], male   DOB: 1961/04/22, 52 y.o.   MRN: [id number] 4 Days Post-Op  Subjective: Eager to go down for surgery with Dr. Dion Saucier  Objective: Vital signs in last 24 hours: Temp:  [98.1 F (36.7 C)-98.7 F (37.1 C)] 98.7 F (37.1 C) (03/13 0528) Pulse Rate:  [99-130] 99 (03/13 0528) Resp:  [16-20] 16 (03/13 0528) BP: (137-147)/(66-82) 137/75 mmHg (03/13 0528) SpO2:  [95 %-96 %] 95 % (03/13 0528) Last BM Date:  (pta)  Intake/Output from previous day: 03/12 0701 - 03/13 0700 In: 720 [P.O.:720] Out: 750 [Urine:750] Intake/Output this shift:    General appearance: alert and cooperative Resp: clear to auscultation bilaterally Chest wall: right sided chest wall tenderness Cardio: regular rate and rhythm GI: soft, NT, ND, +BS Extremities: dressing L tib fib, toes warm with good movement Neuro: alert, F/C  Lab Results: CBC   Recent Labs  09/02/12 1925  WBC 15.2*  HGB 9.8*  HCT 29.0*  PLT 279   BMET  Recent Labs  09/02/12 1925  NA 133*  K 3.5  CL 98  CO2 25  GLUCOSE 118*  BUN 8  CREATININE 0.74  CALCIUM 9.0   PT/INR No results found for this basename: LABPROT, INR,  in the last 72 hours ABG No results found for this basename: PHART, PCO2, PO2, HCO3,  in the last 72 hours  Studies/Results: Dg Shoulder Right  09/02/2012  *RADIOLOGY REPORT*  Clinical Data: Right shoulder pain secondary to a fall.  RIGHT SHOULDER - 2+ VIEW  Comparison: None  Findings: There is no fracture, dislocation or arthritis.  There is new linear atelectasis in the right lung base.  Review of the prior chest CT scan demonstrates fractures of the posterior lateral aspects of the right tenth rib and ninth rib as well as fractures of the right eleventh and twelfth ribs at the costovertebral junctions.  IMPRESSION:   Normal right shoulder.  Multiple right rib fractures.  New atelectasis at the right lung base.   Original Report Authenticated By: Francene Boyers, M.D.      Anti-infectives: Anti-infectives   Start     Dose/Rate Route Frequency Ordered Stop   08/31/12 0600  clindamycin (CLEOCIN) IVPB 900 mg     900 mg 100 mL/hr over 30 Minutes Intravenous 3 times per day 08/31/12 0134 08/31/12 2233      Assessment/Plan: s/p Procedure(s): FASCIOTOMY Fall  Right rib fxs x2 -- Pain control and pulmonary toilet  Lumbar TVP fxs  Left proximal tib/fib fx s/p fasciotomy -- I&D, possible closure medial fasciotomy today by Dr. Dion Saucier Multiple medical problems -- Home meds  Right shoulder pain - X-rays neg FEN -- NPO for OR, Na down a bit - should improve with NPO for OR, F/U in AM VTE -- Plexipulse, Lovenox  Dispo -- PT/OT, OR today I also spoke to his mother at the bedside   LOS: 4 days    Violeta Gelinas, MD, MPH, FACS Pager: [phone]  09/03/2012

## 2012-09-03 NOTE — Anesthesia Postprocedure Evaluation (Signed)
  Anesthesia Post-op Note  Patient: [name]  Procedure(s) Performed: Procedure(s): IRRIGATION AND DEBRIDEMENT EXTREMITY (Left)  Patient Location: PACU  Anesthesia Type:General  Level of Consciousness: awake  Airway and Oxygen Therapy: Patient Spontanous Breathing  Post-op Pain: mild  Post-op Assessment: Post-op Vital signs reviewed  Post-op Vital Signs: stable  Complications: No apparent anesthesia complications

## 2012-09-03 NOTE — Progress Notes (Signed)
UR completed 

## 2012-09-04 LAB — BASIC METABOLIC PANEL
BUN: 6 mg/dL (ref 6–23)
Creatinine, Ser: 0.82 mg/dL (ref 0.50–1.35)
GFR calc non Af Amer: >90 mL/min (ref >90)
Glucose, Bld: 108 mg/dL — ABNORMAL HIGH (ref 70–99)
Potassium: 3.3 mEq/L — ABNORMAL LOW (ref 3.5–5.1)

## 2012-09-04 NOTE — Progress Notes (Signed)
Patient ID: [name], male   DOB: 09-29-1960, 52 y.o.   MRN: [id number]   LOS: 5 days   Subjective: No new c/o. Still wants to go home.  Objective: Vital signs in last 24 hours: Temp:  [97.2 F (36.2 C)-98.4 F (36.9 C)] 98.4 F (36.9 C) (03/14 0612) Pulse Rate:  [94-110] 100 (03/14 0612) Resp:  [12-23] 18 (03/14 0612) BP: (94-156)/(60-95) 121/60 mmHg (03/14 0612) SpO2:  [96 %-100 %] 100 % (03/14 0612) Last BM Date:  (pta)   Laboratory  BMET  Recent Labs  09/02/12 1925 09/03/12 1642 09/04/12 0620  NA 133*  --  135  K 3.5  --  3.3*  CL 98  --  98  CO2 25  --  29  GLUCOSE 118*  --  108*  BUN 8  --  6  CREATININE 0.74 0.74 0.82  CALCIUM 9.0  --  8.6     Physical Exam General appearance: alert and no distress Resp: clear to auscultation bilaterally Cardio: regular rate and rhythm GI: normal findings: bowel sounds normal and soft, non-tender Extremities: NVI   Assessment/Plan: Fall  Right rib fxs x2 -- Pain control and pulmonary toilet  Lumbar TVP fxs  Left proximal tib/fib fx s/p fasciotomy  -- I&D, closure medial fasciotomy by Dr. Dion Saucier yesterday. Plan for ORIF, possible closure lateral fasciotomy Tuesday by Dr. Carola Frost Multiple medical problems -- Home meds  FEN -- No issues.  VTE -- Plexipulse, Lovenox  Dispo -- PT/OT. Dr. Lindie Spruce to d/w Dr. Carola Frost possibility of d/c with surgery as OP    Freeman Caldron, PA-C Pager: [phone] General Trauma PA Pager: [phone]   09/04/2012

## 2012-09-04 NOTE — Progress Notes (Signed)
Pt wishes to d/c. MD agreeable if VAC can be obtained. VAC ordered and faxed to [facility] along with Medicaid form. Await confirmation of approval and delivery.  Pt reported having used HH after his hip surgery.  Said he did not remember the name of the agency but wished to use the same one again.  York Spaniel that it was arranged by our hospital at that time.  Records in our system indicate that pt was treated by [facility]. Will make [facility] arrangements with them again per pt choice.

## 2012-09-04 NOTE — Progress Notes (Signed)
[facility]  Patient Status: Active (receiving services up to time of hospitalization)  [facility] is providing the following services: RN  If patient discharges after hours, please call [phone].   [name] 09/04/2012, 5:21 PM

## 2012-09-04 NOTE — Progress Notes (Signed)
Physical Therapy Treatment Patient Details Name: RAWLINS STUARD MRN: [id number] DOB: 06-24-1961 Today's Date: 09/04/2012 Time: 8295-6213 PT Time Calculation (min): 40 min  PT Assessment / Plan / Recommendation Comments on Treatment Session  Spoke with PA about pt's R shoulder and neck.  PA reporting that X-ray of  R shoulder reveals no acute injury.  PA attributes neck position to torticollis and shoulder pain/weakness to soft tissue injury. Suggested PA consider use of C-collar for cervical positioning. At end of session pt left in chair with bolster attached to chair to hold pt in neutral cervical positon.   Pt doing well with mobility.  Will continue to work on stair negotioation but pt should be able to d/c to home when cleared medicallly.      Follow Up Recommendations  Home health PT;Supervision/Assistance - 24 hour     Does the patient have the potential to tolerate intense rehabilitation     Barriers to Discharge        Equipment Recommendations  Rolling walker with 5" wheels    Recommendations for Other Services    Frequency Min 5X/week   Plan Discharge plan remains appropriate;Frequency remains appropriate    Precautions / Restrictions Precautions Precautions: Fall Precaution Comments: immobilizer LLE Required Braces or Orthoses: Knee Immobilizer - Left Knee Immobilizer - Left: On at all times Restrictions Weight Bearing Restrictions: Yes LLE Weight Bearing: Non weight bearing   Pertinent Vitals/Pain No c/o pain.     Mobility  Bed Mobility Bed Mobility: Supine to Sit Supine to Sit: HOB flat;5: Supervision Details for Bed Mobility Assistance: Pt able to manage LLL.  Assist to raise trunk secondary to rib pain.   Transfers Transfers: Sit to Stand;Stand to Sit Sit to Stand: 5: Supervision;With upper extremity assist;From bed Stand to Sit: 5: Supervision;To chair/3-in-1 Details for Transfer Assistance: vc for technique, safety and to maintain NWB on LLE.    Ambulation/Gait Ambulation/Gait Assistance: 5: Supervision Ambulation Distance (Feet): 60 Feet Assistive device: Rolling walker Ambulation/Gait Assistance Details: Pt demonstrates safety with NWB on LLE.  Gait Pattern: Step-to pattern Stairs: Yes Stairs Assistance: 3: Mod assist Stairs Assistance Details (indicate cue type and reason): VCs for technique Assist to position and steady walker.   Stair Management Technique: Two rails;Backwards;With walker Number of Stairs: 3 (3 x 2) Wheelchair Mobility Wheelchair Mobility: No    Exercises General Exercises - Lower Extremity Straight Leg Raises: 10 reps;Left;AROM;Seated   PT Diagnosis:    PT Problem List:   PT Treatment Interventions:     PT Goals Acute Rehab PT Goals PT Goal Formulation: With patient Time For Goal Achievement: 09/08/12 Potential to Achieve Goals: Good Pt will go Supine/Side to Sit: with modified independence PT Goal: Supine/Side to Sit - Progress: Progressing toward goal Pt will go Sit to Supine/Side: with modified independence PT Goal: Sit to Supine/Side - Progress: Progressing toward goal Pt will Transfer Bed to Chair/Chair to Bed: with supervision PT Transfer Goal: Bed to Chair/Chair to Bed - Progress: Progressing toward goal Pt will Ambulate: 16 - 50 feet;with supervision;with least restrictive assistive device PT Goal: Ambulate - Progress: Progressing toward goal Pt will Go Up / Down Stairs: Flight;with mod assist;with least restrictive assistive device PT Goal: Up/Down Stairs - Progress: Progressing toward goal  Visit Information  Last PT Received On: 09/04/12    Subjective Data  Subjective: No acute injury to R shoulder or C-spine.   Patient Stated Goal: none stated.   Cognition  Cognition Overall Cognitive Status: History of cognitive  impairments - at baseline Arousal/Alertness: Awake/alert Orientation Level: Appears intact for tasks assessed Behavior During Session: [facility] for tasks performed     Balance  Balance Balance Assessed: No  End of Session PT - End of Session Equipment Utilized During Treatment: Gait belt;Left knee immobilizer Activity Tolerance: Patient tolerated treatment well Patient left: in chair;with call bell/phone within reach Nurse Communication: Mobility status   GP     GREEN,PETER 09/04/2012, 5:01 PM [name] DPT [phone]

## 2012-09-04 NOTE — Progress Notes (Signed)
     Subjective:  Patient reports pain as mild.  He feels well, and wants to go home, or at least once to go out and smoke.  Objective:   VITALS:   Filed Vitals:   09/03/12 1445 09/03/12 2155 09/04/12 0612 09/04/12 1330  BP:  117/65 121/60 108/62  Pulse: 108 110 100 110  Temp:  97.9 F (36.6 C) 98.4 F (36.9 C) 99.1 F (37.3 C)  TempSrc:  Oral Oral   Resp: 14 18 18 17   Height:      Weight:      SpO2: 100% 100% 100% 100%    Neurologically intact Sensation intact distally Dorsiflexion/Plantar flexion intact Wound VAC is in place.   Lab Results  Component Value Date   WBC 15.5* 09/03/2012   HGB 8.9* 09/03/2012   HCT 26.5* 09/03/2012   MCV 91.4 09/03/2012   PLT 348 09/03/2012     Assessment/Plan: 1 Day Post-Op   Principal Problem:   Traumatic compartment syndrome of left leg Active Problems:   Cirrhosis   Hepatitis C   Fracture of ribs, right 11 & 12   Closed fracture of transverse process of lumbar vertebra L2 & L3   Tibia/fibula fracture   Fall   Advance diet Up with therapy Nonweightbearing He is planning on having delayed primary closure versus skin  grafting with probable internal fixation next Tuesday with Dr. handy. The patient wants to go home, and will require a wound VAC at home, and if this can be arranged, and then we will allow him to be discharged home, nonweightbearing, with a wound VAC, to return on Tuesday for surgery with Dr. handy. Otherwise he'll need to stay in the hospital.  I discussed the tobacco utilization of Dr. handy, and he has indicated that if it is absolutely necessary, that the patient could go outside and smoke, although this does increase his risks for nonunion as well as perioperative complications.   LANDAU,JOSHUA P 09/04/2012, 3:41 PM   Teryl Lucy, MD Cell [phone] Pager [phone]

## 2012-09-04 NOTE — Progress Notes (Signed)
No other outstanding trauma issues.  Patient should be able to go home with VAC.  This patient has been seen and I agree with the findings and treatment plan.  Marta Lamas. Gae Bon, MD, FACS [phone] (pager) [phone] (direct pager) Trauma Surgeon

## 2012-09-05 LAB — COMPREHENSIVE METABOLIC PANEL
ALT: 15 U/L (ref 0–53)
Albumin: 2.8 g/dL — ABNORMAL LOW (ref 3.5–5.2)
BUN: 6 mg/dL (ref 6–23)
Calcium: 9.5 mg/dL (ref 8.4–10.5)
GFR calc Af Amer: >90 mL/min (ref >90)
Glucose, Bld: 106 mg/dL — ABNORMAL HIGH (ref 70–99)
Sodium: 137 mEq/L (ref 135–145)
Total Protein: 6.7 g/dL (ref 6.0–8.3)

## 2012-09-05 LAB — LITHIUM LEVEL: Lithium Lvl: 0.75 mEq/L — ABNORMAL LOW (ref 0.80–1.40)

## 2012-09-05 LAB — CBC
Hemoglobin: 8.8 g/dL — ABNORMAL LOW (ref 13.0–17.0)
MCH: 30 pg (ref 26.0–34.0)
MCHC: 32.1 g/dL (ref 30.0–36.0)
RDW: 13.6 % (ref 11.5–15.5)

## 2012-09-05 MED ORDER — OXYCODONE HCL 10 MG PO TABS: 10.0000 mg | ORAL_TABLET | ORAL | Status: DC | PRN

## 2012-09-05 NOTE — Progress Notes (Signed)
Changed hospital wound vac to take home (travel) vac per md order. No issues. Pt tolerated well. Reviewed vac with pt. Pt states his doctor has reviewed everything with him. Pt verbalizes understanding. Pt without any further questions. Discharge instructions completed. Pt discharged home and to return for further surgery. Pt aware of all md plans of further surgery. Wound vac supplies sent with pt at discharge after [facility] KI in place.

## 2012-09-05 NOTE — Progress Notes (Signed)
   CARE MANAGEMENT NOTE 09/05/2012  Patient:  [name], [name]   Account Number:  [id number]  Date Initiated:  09/03/2012  Documentation initiated by:  Carlyle Lipa  Subjective/Objective Assessment:   fell down steps; leg injury requiring faciotomy, ex fix placement.     Action/Plan:   serial OR trips to complete repairs; SNF vs HH at d/c   Anticipated DC Date:  09/10/2012   Anticipated DC Plan:  HOME W HOME HEALTH SERVICES      DC Planning Services  CM consult      Choice offered to / List presented to:          [facility] arranged  HH-1 RN      [facility] agency  [facility]   Status of service:  Completed, signed off Medicare Important Message given?   (If response is "NO", the following Medicare IM given date fields will be blank) Date Medicare IM given:   Date Additional Medicare IM given:    Discharge Disposition:  HOME W HOME HEALTH SERVICES  Per UR Regulation:  Reviewed for med. necessity/level of care/duration of stay  If discussed at Long Length of Stay Meetings, dates discussed:    Comments:  09/05/2012 1620 NCM notified [facility] of dc home today. Isidoro Donning RN CCM Case Mgmt phone [phone]

## 2012-09-05 NOTE — Progress Notes (Signed)
Patient ID: [name], male   DOB: Aug 15, 1960, 52 y.o.   MRN: [id number]   LOS: 6 days   Subjective: No changes, ready to go home.  Objective: Vital signs in last 24 hours: Temp:  [97.6 F (36.4 C)-99.1 F (37.3 C)] 97.7 F (36.5 C) (03/15 0621) Pulse Rate:  [104-110] 104 (03/15 0621) Resp:  [17-18] 18 (03/15 0621) BP: (108-123)/(62-76) 123/76 mmHg (03/15 0621) SpO2:  [100 %] 100 % (03/15 0621) Last BM Date: 09/01/12   Laboratory  CBC  Recent Labs  09/03/12 1642 09/05/12 0943  WBC 15.5* 17.7*  HGB 8.9* 8.8*  HCT 26.5* 27.4*  PLT 348 480*   BMET  Recent Labs  09/04/12 0620 09/05/12 0943  NA 135 137  K 3.3* 4.6  CL 98 101  CO2 29 31  GLUCOSE 108* 106*  BUN 6 6  CREATININE 0.82 0.79  CALCIUM 8.6 9.5     Physical Exam General appearance: alert and no distress Extremities: VAC in place   Assessment/Plan: Fall  Right rib fxs x2 -- Pain control and pulmonary toilet  Lumbar TVP fxs  Left proximal tib/fib fx s/p fasciotomy -- I&D, closure medial fasciotomy by Dr. Dion Saucier yesterday. Plan for ORIF, possible closure lateral fasciotomy Tuesday by Dr. Carola Frost  Multiple medical problems -- Home meds  Dispo -- D/C home with [facility]    Freeman Caldron, PA-C Pager: [phone] General Trauma PA Pager: [phone]   09/05/2012

## 2012-09-05 NOTE — Progress Notes (Signed)
Subjective: 2 Days Post-Op Procedure(s) (LRB): IRRIGATION AND DEBRIDEMENT EXTREMITY (Left) Patient reports pain as 2 on 0-10 scale and 5 on 0-10 scale.    Objective: Vital signs in last 24 hours: Temp:  [97.6 F (36.4 C)-99.1 F (37.3 C)] 97.7 F (36.5 C) (03/15 0621) Pulse Rate:  [104-110] 104 (03/15 0621) Resp:  [17-18] 18 (03/15 0621) BP: (108-123)/(62-76) 123/76 mmHg (03/15 0621) SpO2:  [100 %] 100 % (03/15 0621)  Intake/Output from previous day: 03/14 0701 - 03/15 0700 In: 682.5 [P.O.:270; I.V.:412.5] Out: 100 [Drains:100] Intake/Output this shift:     Recent Labs  09/02/12 1925 09/03/12 1642  HGB 9.8* 8.9*    Recent Labs  09/02/12 1925 09/03/12 1642  WBC 15.2* 15.5*  RBC 3.21* 2.90*  HCT 29.0* 26.5*  PLT 279 348    Recent Labs  09/02/12 1925 09/03/12 1642 09/04/12 0620  NA 133*  --  135  K 3.5  --  3.3*  CL 98  --  98  CO2 25  --  29  BUN 8  --  6  CREATININE 0.74 0.74 0.82  GLUCOSE 118*  --  108*  CALCIUM 9.0  --  8.6   No results found for this basename: LABPT, INR,  in the last 72 hours  Intact pulses distally  Assessment/Plan: 2 Days Post-Op Procedure(s) (LRB): IRRIGATION AND DEBRIDEMENT EXTREMITY (Left) Up with therapy Patient wants go home.  Many social and some medical issues involved with that decision.  Will defer to Trauma.  Will order new CBC and BMET and lithium levels.  Pharmacy can adjust lithium if necessary  [name] J 09/05/2012, 8:56 AM

## 2012-09-05 NOTE — Progress Notes (Signed)
Occupational Therapy Treatment Patient Details Name: [name] MRN: [id number] DOB: 13-Dec-1960 Today's Date: 09/05/2012 Time: 0937-1000 OT Time Calculation (min): 23 min  OT Assessment / Plan / Recommendation Comments on Treatment Session  On entry to room pt standing and beginning to walk with foot pumps attached and operating. Discussed safety concerns, pt states he was getting ready to go smoke.? Mother present. Pt able to D/C home at current functional level. Pt's house is w/c accessible and pt has w/c. Pt apparently has side entrance to house with only 1 step. Concern with D/C  is wound care, safety and social issues regarding alcohol use,etc. Pt has hx of bipolar disorder and this is most likely his baseline behavior/cognition. If pt is to d/C home before surgery, it is likely that he will resume previous behaviors given his Bipolar Disorder.  Pt has all [employer]/AE for home use. Educated pt/family on compensatory techniques for dressing. All further OT to be addressed by [facility]. OT signing off. Pt perseverating on smoking throughout session.    Follow Up Recommendations  Home health OT  Recommend HH social Work   Barriers to Discharge   psychosocial issues    Equipment Recommendations  None recommended by OT    Recommendations for Other Services    Frequency Min 3X/week   Plan Discharge plan remains appropriate    Precautions / Restrictions Precautions Precautions: Fall Precaution Comments: immobilizer LLE Restrictions Weight Bearing Restrictions: Yes LLE Weight Bearing: Non weight bearing   Pertinent Vitals/Pain no apparent distress     ADL  ADL Comments: focus on functional mobility withADl. discussion of clothing to increase safety (mother assisting with LB ADL)    OT Diagnosis:    OT Problem List:   OT Treatment Interventions:     OT Goals Acute Rehab OT Goals OT Goal Formulation: With patient Time For Goal Achievement: 09/15/12 Potential to Achieve Goals:  Good ADL Goals Additional ADL Goal #1: pt/family will demonstrate ability to complete LB ADL independently ADL Goal: Additional Goal #1 - Progress: Goal set today Additional ADL Goal #2: pt/family will demonstrate ability to complete toilet transfers and toileting @ RW level @ mod i  level ADL Goal: Additional Goal #2 - Progress: Goal set today  Visit Information  Last OT Received On: 09/05/12 Assistance Needed: +1    Subjective Data      Prior Functioning       Cognition  Cognition Overall Cognitive Status: History of cognitive impairments - at baseline Arousal/Alertness: Awake/alert Orientation Level: Appears intact for tasks assessed Behavior During Session: Restless    Mobility  Transfers Transfers: Sit to Stand;Stand to Sit Sit to Stand: 5: Supervision Stand to Sit: 5: Supervision Details for Transfer Assistance: vc for hand placement    Exercises      Balance  S RW level   End of Session OT - End of Session Equipment Utilized During Treatment: Gait belt Activity Tolerance: Patient tolerated treatment well Patient left: in chair;with call bell/phone within reach Nurse Communication: Mobility status;Other (comment)  GO     Marcille Barman,[name] 09/05/2012, 10:14 AM Luisa Dago, OTR/L  [phone] 09/05/2012

## 2012-09-05 NOTE — Progress Notes (Signed)
I have seen and examined the pt and agree with PA-Jeffery's note. 

## 2012-09-05 NOTE — Discharge Summary (Signed)
Physician Discharge Summary  Patient ID: [name] MRN: [id number] DOB/AGE: 1960-10-14 52 y.o.  Admit date: 08/30/2012 Discharge date: 09/05/2012  Discharge Diagnoses Patient Active Problem List   Diagnosis Date Noted  . Fall 09/01/2012  . Fracture of ribs, right 11 & 12 08/30/2012  . Closed fracture of transverse process of lumbar vertebra L2 & L3 08/30/2012  . Tibia/fibula fracture 08/30/2012  . Traumatic compartment syndrome of left leg 08/30/2012  . COPD (chronic obstructive pulmonary disease)   . Anxiety   . Hepatitis C 07/09/2012  . Hypothyroid 07/09/2012  . Constipation 03/17/2012  . Cirrhosis 03/17/2012  . CHRONIC PANCREATITIS 03/14/2008    Consultants Drs. Jene Every and Teryl Lucy for orthopedic surgery   Procedures Fasciotomies of left lower leg by Dr. Shelle Iron  Irrigation and debridement, left leg, skin, subcutaneous tissue, muscle, fascia, with delayed primary closure, simple, 9 cm incision medially, and partial closure of lateral incision, with application of wound VAC, 14 cm x 3 cm laterally by Dr. Dion Saucier   HPI: [name] was found at the bottom the stairs crumpled and bent over and moaning in pain. Brought by EMS to [facility] ED. Initially billed as knee pain so workup delayed. Once evaluated he was seen as a trauma. CT scans of the head, cervical spine, chest, abdomen, and pelvis as well as extremity x-rays showed the above-mentioned injuries. He complained that his left knee and leg pain was getting much more intense he was noted to be more swollen. Orthopedic surgery was consulted for the fracture as well as for a concern for compartment syndrome. This was confirmed and he was taken to the OR for fasciotomies. Following that he was transferred to [facility] for further care.   Hospital Course: The patient did not suffer any respiratory compromise due to his rib fractures. He was mobilized with physical and occupational therapies and did reasonably well. Despite his  narcotic tolerance we were able to control his pain relatively easily with an increased dose of oxycodone. He returned to the OR several days later for the second listed procedure. Both he and his mother, who takes care of him, wished to go home until the next surgery. Once we had approval from orthopedic surgery we sought approval for a home VAC. Once that was delivered he was able to be discharged home in stable condition in the care of his mother with plans to return Tuesday for further operative treatment of his lower leg injury by Dr. Carola Frost. His dressing, including the VAC, should remain in place until he returns to the OR.      Medication List    TAKE these medications       buPROPion 150 MG 24 hr tablet  Commonly known as:  WELLBUTRIN XL  Take 150 mg by mouth every morning.     diazepam 10 MG tablet  Commonly known as:  VALIUM  Take 10 mg by mouth every 8 (eight) hours as needed. For anxiety     DULoxetine 60 MG capsule  Commonly known as:  CYMBALTA  Take 60 mg by mouth 2 (two) times daily.     fentaNYL 100 MCG/HR  Commonly known as:  DURAGESIC - dosed mcg/hr  Place 3 patches onto the skin every other day. This dose was verified, it is correct     levothyroxine 88 MCG tablet  Commonly known as:  SYNTHROID, LEVOTHROID  Take 88 mcg by mouth daily.     lithium carbonate 300 MG capsule  Take 300  mg by mouth 3 (three) times daily with meals.     Oxycodone HCl 10 MG Tabs  Take 1-2 tablets (10-20 mg total) by mouth every 4 (four) hours as needed.     promethazine 25 MG tablet  Commonly known as:  PHENERGAN  Take 25 mg by mouth every 6 (six) hours as needed. Nausea and vomiting             Follow-up Information   Follow up with Budd Palmer, MD. Call on 09/07/2012.   Contact information:   [address] [place] [address] [phone]       Call [facility]. (As needed)    Contact information:   [address] [place] [address] [phone]      Discharge planning took >30 minutes   Signed: Freeman Caldron, PA-C Pager: [phone] General Trauma PA Pager: [phone]  09/05/2012, 11:46 AM

## 2012-09-07 ENCOUNTER — Encounter (HOSPITAL_COMMUNITY): Payer: Self-pay | Admitting: *Deleted

## 2012-09-07 MED ORDER — MIDAZOLAM HCL 2 MG/2ML IJ SOLN: 1.0000 mg | INTRAMUSCULAR | Status: DC | PRN

## 2012-09-07 MED ORDER — FENTANYL CITRATE 0.05 MG/ML IJ SOLN: 50.0000 ug | INTRAMUSCULAR | Status: DC | PRN

## 2012-09-08 ENCOUNTER — Encounter (HOSPITAL_COMMUNITY): Payer: Self-pay | Admitting: *Deleted

## 2012-09-08 ENCOUNTER — Ambulatory Visit (HOSPITAL_COMMUNITY): Payer: Medicaid Other | Admitting: *Deleted

## 2012-09-08 ENCOUNTER — Encounter (HOSPITAL_COMMUNITY)
Admission: RE | Disposition: A | Discharge status: Home / Self Care | Payer: Self-pay | Source: Ambulatory Visit | Attending: Orthopedic Surgery

## 2012-09-08 ENCOUNTER — Ambulatory Visit (HOSPITAL_COMMUNITY): Payer: Medicaid Other

## 2012-09-08 ENCOUNTER — Ambulatory Visit (HOSPITAL_COMMUNITY)
Admission: RE | Admit: 2012-09-08 | Discharge: 2012-09-10 | Disposition: A | Discharge status: Home / Self Care | Payer: Medicaid Other | Source: Ambulatory Visit | Attending: Orthopedic Surgery | Admitting: Orthopedic Surgery

## 2012-09-08 DIAGNOSIS — W108XXA Fall (on) (from) other stairs and steps, initial encounter: Secondary | ICD-10-CM | POA: Insufficient documentation

## 2012-09-08 DIAGNOSIS — Y834 Other reconstructive surgery as the cause of abnormal reaction of the patient, or of later complication, without mention of misadventure at the time of the procedure: Secondary | ICD-10-CM | POA: Insufficient documentation

## 2012-09-08 DIAGNOSIS — E039 Hypothyroidism, unspecified: Secondary | ICD-10-CM | POA: Diagnosis present

## 2012-09-08 DIAGNOSIS — S82109A Unspecified fracture of upper end of unspecified tibia, initial encounter for closed fracture: Secondary | ICD-10-CM | POA: Insufficient documentation

## 2012-09-08 DIAGNOSIS — F419 Anxiety disorder, unspecified: Secondary | ICD-10-CM | POA: Diagnosis present

## 2012-09-08 DIAGNOSIS — T79A29A Traumatic compartment syndrome of unspecified lower extremity, initial encounter: Secondary | ICD-10-CM

## 2012-09-08 DIAGNOSIS — B1920 Unspecified viral hepatitis C without hepatic coma: Secondary | ICD-10-CM | POA: Diagnosis present

## 2012-09-08 DIAGNOSIS — G8929 Other chronic pain: Secondary | ICD-10-CM

## 2012-09-08 DIAGNOSIS — T8189XA Other complications of procedures, not elsewhere classified, initial encounter: Secondary | ICD-10-CM | POA: Insufficient documentation

## 2012-09-08 DIAGNOSIS — F319 Bipolar disorder, unspecified: Secondary | ICD-10-CM

## 2012-09-08 DIAGNOSIS — S82409A Unspecified fracture of shaft of unspecified fibula, initial encounter for closed fracture: Secondary | ICD-10-CM | POA: Insufficient documentation

## 2012-09-08 DIAGNOSIS — T148XXA Other injury of unspecified body region, initial encounter: Secondary | ICD-10-CM

## 2012-09-08 DIAGNOSIS — K5900 Constipation, unspecified: Secondary | ICD-10-CM | POA: Diagnosis present

## 2012-09-08 DIAGNOSIS — S82202D Unspecified fracture of shaft of left tibia, subsequent encounter for closed fracture with routine healing: Secondary | ICD-10-CM

## 2012-09-08 DIAGNOSIS — J449 Chronic obstructive pulmonary disease, unspecified: Secondary | ICD-10-CM | POA: Insufficient documentation

## 2012-09-08 DIAGNOSIS — J4489 Other specified chronic obstructive pulmonary disease: Secondary | ICD-10-CM | POA: Insufficient documentation

## 2012-09-08 DIAGNOSIS — T79A22D Traumatic compartment syndrome of left lower extremity, subsequent encounter: Secondary | ICD-10-CM

## 2012-09-08 HISTORY — DX: Shortness of breath: R06.02

## 2012-09-08 HISTORY — DX: Hypoxemia: R09.02

## 2012-09-08 HISTORY — DX: Gastro-esophageal reflux disease without esophagitis: K21.9

## 2012-09-08 HISTORY — DX: Unspecified injury of head, initial encounter: S09.90XA

## 2012-09-08 HISTORY — PX: SKIN SPLIT GRAFT: SHX444

## 2012-09-08 LAB — CBC
Hemoglobin: 9.3 g/dL — ABNORMAL LOW (ref 13.0–17.0)
RBC: 3.09 MIL/uL — ABNORMAL LOW (ref 4.22–5.81)

## 2012-09-08 SURGERY — APPLICATION, GRAFT, SKIN, SPLIT-THICKNESS
Anesthesia: General | Site: Leg Lower | Laterality: Left | Wound class: Contaminated

## 2012-09-08 MED ORDER — MEPERIDINE HCL 25 MG/ML IJ SOLN: 6.2500 mg | INTRAMUSCULAR | Status: DC | PRN

## 2012-09-08 MED ORDER — PROMETHAZINE HCL 25 MG PO TABS
25.0000 mg | ORAL_TABLET | Freq: Four times a day (QID) | ORAL | Status: DC | PRN
  Administered 2012-09-10: 25 mg via ORAL
  Filled 2012-09-08: qty 1

## 2012-09-08 MED ORDER — PROPOFOL 10 MG/ML IV BOLUS
INTRAVENOUS | Status: DC | PRN
  Administered 2012-09-08: 100 mg via INTRAVENOUS
  Administered 2012-09-08: 50 mg via INTRAVENOUS

## 2012-09-08 MED ORDER — HYDROMORPHONE HCL PF 1 MG/ML IJ SOLN: 0.2500 mg | INTRAMUSCULAR | Status: DC | PRN

## 2012-09-08 MED ORDER — MORPHINE SULFATE 2 MG/ML IJ SOLN
1.0000 mg | INTRAMUSCULAR | Status: DC | PRN
  Administered 2012-09-09: 2 mg via INTRAVENOUS
  Filled 2012-09-08: qty 1

## 2012-09-08 MED ORDER — ONDANSETRON HCL 4 MG/2ML IJ SOLN: 4.0000 mg | Freq: Once | INTRAMUSCULAR | Status: DC | PRN

## 2012-09-08 MED ORDER — GLYCOPYRROLATE 0.2 MG/ML IJ SOLN
INTRAMUSCULAR | Status: DC | PRN
  Administered 2012-09-08: 0.6 mg via INTRAVENOUS

## 2012-09-08 MED ORDER — METOCLOPRAMIDE HCL 10 MG PO TABS: 5.0000 mg | ORAL_TABLET | Freq: Three times a day (TID) | ORAL | Status: DC | PRN

## 2012-09-08 MED ORDER — NEOSTIGMINE METHYLSULFATE 1 MG/ML IJ SOLN
INTRAMUSCULAR | Status: DC | PRN
  Administered 2012-09-08: 5 mg via INTRAVENOUS

## 2012-09-08 MED ORDER — CLINDAMYCIN PHOSPHATE 900 MG/50ML IV SOLN
900.0000 mg | INTRAVENOUS | Status: AC
  Administered 2012-09-08: 900 mg via INTRAVENOUS
  Filled 2012-09-08: qty 50

## 2012-09-08 MED ORDER — FENTANYL CITRATE 0.05 MG/ML IJ SOLN
INTRAMUSCULAR | Status: DC | PRN
  Administered 2012-09-08: 150 ug via INTRAVENOUS
  Administered 2012-09-08: 100 ug via INTRAVENOUS
  Administered 2012-09-08: 150 ug via INTRAVENOUS
  Administered 2012-09-08: 100 ug via INTRAVENOUS

## 2012-09-08 MED ORDER — SODIUM CHLORIDE 0.9 % IR SOLN
Status: DC | PRN
  Administered 2012-09-08: 1000 mL

## 2012-09-08 MED ORDER — POTASSIUM CHLORIDE IN NACL 20-0.9 MEQ/L-% IV SOLN
INTRAVENOUS | Status: DC
  Filled 2012-09-08 (×2): qty 1000

## 2012-09-08 MED ORDER — ONDANSETRON HCL 4 MG PO TABS: 4.0000 mg | ORAL_TABLET | Freq: Four times a day (QID) | ORAL | Status: DC | PRN

## 2012-09-08 MED ORDER — SUCCINYLCHOLINE CHLORIDE 20 MG/ML IJ SOLN
INTRAMUSCULAR | Status: DC | PRN
  Administered 2012-09-08: 100 mg via INTRAVENOUS

## 2012-09-08 MED ORDER — OXYCODONE HCL 5 MG PO TABS
10.0000 mg | ORAL_TABLET | ORAL | Status: DC | PRN
  Administered 2012-09-08 – 2012-09-09 (×4): 20 mg via ORAL
  Administered 2012-09-09: 10 mg via ORAL
  Administered 2012-09-10: 20 mg via ORAL
  Filled 2012-09-08 (×3): qty 4
  Filled 2012-09-08: qty 2
  Filled 2012-09-08 (×2): qty 4

## 2012-09-08 MED ORDER — LITHIUM CARBONATE 300 MG PO CAPS
300.0000 mg | ORAL_CAPSULE | Freq: Three times a day (TID) | ORAL | Status: DC
  Administered 2012-09-09 – 2012-09-10 (×6): 300 mg via ORAL
  Filled 2012-09-08 (×7): qty 1

## 2012-09-08 MED ORDER — LEVOTHYROXINE SODIUM 88 MCG PO TABS
88.0000 ug | ORAL_TABLET | Freq: Every day | ORAL | Status: DC
  Administered 2012-09-09 – 2012-09-10 (×2): 88 ug via ORAL
  Filled 2012-09-08 (×3): qty 1

## 2012-09-08 MED ORDER — DIAZEPAM 5 MG PO TABS
10.0000 mg | ORAL_TABLET | Freq: Three times a day (TID) | ORAL | Status: DC | PRN
Start: 2012-09-08 — End: 2012-09-10
  Administered 2012-09-08 – 2012-09-09 (×2): 10 mg via ORAL
  Filled 2012-09-08 (×2): qty 2

## 2012-09-08 MED ORDER — DULOXETINE HCL 60 MG PO CPEP
60.0000 mg | ORAL_CAPSULE | Freq: Two times a day (BID) | ORAL | Status: DC
  Administered 2012-09-08 – 2012-09-10 (×4): 60 mg via ORAL
  Filled 2012-09-08 (×5): qty 1

## 2012-09-08 MED ORDER — MORPHINE SULFATE 10 MG/ML IJ SOLN
INTRAMUSCULAR | Status: DC | PRN
  Administered 2012-09-08: 2 mg via INTRAVENOUS

## 2012-09-08 MED ORDER — BUPIVACAINE-EPINEPHRINE 0.25% -1:200000 IJ SOLN
INTRAMUSCULAR | Status: AC
  Filled 2012-09-08: qty 1

## 2012-09-08 MED ORDER — LIDOCAINE HCL 4 % MT SOLN
OROMUCOSAL | Status: DC | PRN
  Administered 2012-09-08: 4 mL via TOPICAL

## 2012-09-08 MED ORDER — FENTANYL 50 MCG/HR TD PT72
300.0000 ug | MEDICATED_PATCH | TRANSDERMAL | Status: DC
  Administered 2012-09-08: 300 ug via TRANSDERMAL
  Filled 2012-09-08: qty 1
  Filled 2012-09-08: qty 3
  Filled 2012-09-08: qty 2

## 2012-09-08 MED ORDER — OXYCODONE HCL 5 MG PO TABS: 5.0000 mg | ORAL_TABLET | Freq: Once | ORAL | Status: DC | PRN

## 2012-09-08 MED ORDER — BUPIVACAINE-EPINEPHRINE PF 0.25-1:200000 % IJ SOLN
INTRAMUSCULAR | Status: DC | PRN
  Administered 2012-09-08: 50 mL

## 2012-09-08 MED ORDER — ROCURONIUM BROMIDE 100 MG/10ML IV SOLN
INTRAVENOUS | Status: DC | PRN
  Administered 2012-09-08: 30 mg via INTRAVENOUS

## 2012-09-08 MED ORDER — LACTATED RINGERS IV SOLN
INTRAVENOUS | Status: DC | PRN
  Administered 2012-09-08 (×2): via INTRAVENOUS

## 2012-09-08 MED ORDER — MIDAZOLAM HCL 5 MG/5ML IJ SOLN
INTRAMUSCULAR | Status: DC | PRN
  Administered 2012-09-08: 2 mg via INTRAVENOUS

## 2012-09-08 MED ORDER — METOCLOPRAMIDE HCL 5 MG/ML IJ SOLN: 5.0000 mg | Freq: Three times a day (TID) | INTRAMUSCULAR | Status: DC | PRN

## 2012-09-08 MED ORDER — LACTATED RINGERS IV SOLN
INTRAVENOUS | Status: DC
  Administered 2012-09-08: 15:00:00 via INTRAVENOUS

## 2012-09-08 MED ORDER — DOXYCYCLINE HYCLATE 100 MG PO TABS
100.0000 mg | ORAL_TABLET | Freq: Two times a day (BID) | ORAL | Status: DC
  Administered 2012-09-08 – 2012-09-10 (×4): 100 mg via ORAL
  Filled 2012-09-08 (×5): qty 1

## 2012-09-08 MED ORDER — BUPROPION HCL ER (XL) 150 MG PO TB24
150.0000 mg | ORAL_TABLET | ORAL | Status: DC
  Administered 2012-09-09 – 2012-09-10 (×2): 150 mg via ORAL
  Filled 2012-09-08 (×3): qty 1

## 2012-09-08 MED ORDER — MINERAL OIL LIGHT 100 % EX OIL
TOPICAL_OIL | CUTANEOUS | Status: DC | PRN
  Administered 2012-09-08: 1 via TOPICAL

## 2012-09-08 MED ORDER — ONDANSETRON HCL 4 MG/2ML IJ SOLN: 4.0000 mg | Freq: Four times a day (QID) | INTRAMUSCULAR | Status: DC | PRN

## 2012-09-08 MED ORDER — MINERAL OIL LIGHT 100 % EX OIL
TOPICAL_OIL | CUTANEOUS | Status: AC
  Filled 2012-09-08: qty 25

## 2012-09-08 MED ORDER — OXYCODONE HCL 5 MG/5ML PO SOLN: 5.0000 mg | Freq: Once | ORAL | Status: DC | PRN

## 2012-09-08 SURGICAL SUPPLY — 63 items
BALL CTTN LRG ABS STRL LF (GAUZE/BANDAGES/DRESSINGS)
BANDAGE ELASTIC 4 VELCRO ST LF (GAUZE/BANDAGES/DRESSINGS) ×1 IMPLANT
BANDAGE ELASTIC 6 VELCRO ST LF (GAUZE/BANDAGES/DRESSINGS) ×3 IMPLANT
BANDAGE GAUZE ELAST BULKY 4 IN (GAUZE/BANDAGES/DRESSINGS) ×1 IMPLANT
BLADE DERMATOME SS (BLADE) ×2 IMPLANT
BLADE SURG ROTATE 9660 (MISCELLANEOUS) IMPLANT
BNDG COHESIVE 4X5 TAN STRL (GAUZE/BANDAGES/DRESSINGS) ×1 IMPLANT
BRUSH SCRUB DISP (MISCELLANEOUS) ×4 IMPLANT
CANISTER SUCTION 2500CC (MISCELLANEOUS) ×1 IMPLANT
CANISTER WOUND CARE 500ML ATS (WOUND CARE) ×1 IMPLANT
CLOTH BEACON ORANGE TIMEOUT ST (SAFETY) ×2 IMPLANT
COTTONBALL LRG STERILE PKG (GAUZE/BANDAGES/DRESSINGS) ×2 IMPLANT
COVER SURGICAL LIGHT HANDLE (MISCELLANEOUS) ×3 IMPLANT
DEPRESSOR TONGUE BLADE STERILE (MISCELLANEOUS) ×3 IMPLANT
DERMACARRIERS GRAFT 1 TO 1.5 (DISPOSABLE) ×2
DRAPE C-ARMOR (DRAPES) ×1 IMPLANT
DRAPE EXTREMITY T 121X128X90 (DRAPE) IMPLANT
DRAPE PROXIMA HALF (DRAPES) ×3 IMPLANT
DRSG ADAPTIC 3X8 NADH LF (GAUZE/BANDAGES/DRESSINGS) ×1 IMPLANT
DRSG MEPITEL 3X4 ME34 (GAUZE/BANDAGES/DRESSINGS) ×1 IMPLANT
DRSG MEPITEL 4X7.2 (GAUZE/BANDAGES/DRESSINGS) ×1 IMPLANT
DRSG PAD ABDOMINAL 8X10 ST (GAUZE/BANDAGES/DRESSINGS) ×4 IMPLANT
DRSG VAC ATS MED SENSATRAC (GAUZE/BANDAGES/DRESSINGS) ×1 IMPLANT
ELECT REM PT RETURN 9FT ADLT (ELECTROSURGICAL) ×2
ELECTRODE REM PT RTRN 9FT ADLT (ELECTROSURGICAL) IMPLANT
GAUZE SPONGE 4X4 16PLY XRAY LF (GAUZE/BANDAGES/DRESSINGS) ×2 IMPLANT
GAUZE XEROFORM 5X9 LF (GAUZE/BANDAGES/DRESSINGS) ×1 IMPLANT
GLOVE BIO SURGEON STRL SZ7.5 (GLOVE) ×2 IMPLANT
GLOVE BIO SURGEON STRL SZ8 (GLOVE) ×2 IMPLANT
GLOVE BIOGEL PI IND STRL 7.5 (GLOVE) ×1 IMPLANT
GLOVE BIOGEL PI IND STRL 8 (GLOVE) ×1 IMPLANT
GLOVE BIOGEL PI INDICATOR 7.5 (GLOVE) ×1
GLOVE BIOGEL PI INDICATOR 8 (GLOVE) ×1
GOWN PREVENTION PLUS XLARGE (GOWN DISPOSABLE) ×2 IMPLANT
GOWN PREVENTION PLUS XXLARGE (GOWN DISPOSABLE) IMPLANT
GOWN STRL NON-REIN LRG LVL3 (GOWN DISPOSABLE) ×4 IMPLANT
GRAFT DERMACARRIERS 1 TO 1.5 (DISPOSABLE) IMPLANT
HANDPIECE INTERPULSE COAX TIP (DISPOSABLE)
IMMOBILIZER KNEE 22 UNIV (SOFTGOODS) ×1 IMPLANT
KIT BASIN OR (CUSTOM PROCEDURE TRAY) ×2 IMPLANT
KIT ROOM TURNOVER OR (KITS) ×2 IMPLANT
MANIFOLD NEPTUNE II (INSTRUMENTS) IMPLANT
NS IRRIG 1000ML POUR BTL (IV SOLUTION) ×2 IMPLANT
PACK GENERAL/GYN (CUSTOM PROCEDURE TRAY) ×2 IMPLANT
PAD ARMBOARD 7.5X6 YLW CONV (MISCELLANEOUS) ×3 IMPLANT
PAD CAST 4YDX4 CTTN HI CHSV (CAST SUPPLIES) ×1 IMPLANT
PADDING CAST COTTON 4X4 STRL (CAST SUPPLIES)
PADDING CAST COTTON 6X4 STRL (CAST SUPPLIES) ×1 IMPLANT
PENCIL BUTTON HOLSTER BLD 10FT (ELECTRODE) ×2 IMPLANT
SET HNDPC FAN SPRY TIP SCT (DISPOSABLE) IMPLANT
SPONGE GAUZE 4X4 12PLY (GAUZE/BANDAGES/DRESSINGS) ×3 IMPLANT
SPONGE LAP 18X18 X RAY DECT (DISPOSABLE) ×6 IMPLANT
SPONGE SCRUB IODOPHOR (GAUZE/BANDAGES/DRESSINGS) ×2 IMPLANT
STAPLER VISISTAT (STAPLE) ×1 IMPLANT
STAPLER VISISTAT 35W (STAPLE) ×2 IMPLANT
STOCKINETTE IMPERVIOUS LG (DRAPES) ×1 IMPLANT
SUT ETHILON 3 0 FSL (SUTURE) ×1 IMPLANT
TOWEL OR 17X24 6PK STRL BLUE (TOWEL DISPOSABLE) ×2 IMPLANT
TOWEL OR 17X26 10 PK STRL BLUE (TOWEL DISPOSABLE) ×4 IMPLANT
TUBE CONNECTING 12X1/4 (SUCTIONS) IMPLANT
UNDERPAD 30X30 INCONTINENT (UNDERPADS AND DIAPERS) ×2 IMPLANT
WATER STERILE IRR 1000ML POUR (IV SOLUTION) ×1 IMPLANT
YANKAUER SUCT BULB TIP NO VENT (SUCTIONS) IMPLANT

## 2012-09-08 NOTE — Anesthesia Postprocedure Evaluation (Signed)
Anesthesia Post Note  Patient: [name] Begin  Procedure(s) Performed: Procedure(s) (LRB):  SPLIT THICKNESS SKIN GRAFT LEFT LEG  (Left)  Anesthesia type: general  Patient location: PACU  Post pain: Pain level controlled  Post assessment: Patient's Cardiovascular Status Stable  Last Vitals:  Filed Vitals:   09/08/12 1727  BP: 149/78  Pulse: 87  Temp:   Resp: 14    Post vital signs: Reviewed and stable  Level of consciousness: sedated  Complications: No apparent anesthesia complications

## 2012-09-08 NOTE — Progress Notes (Signed)
Called patient at home number and cell number.  Left message on home phone to call us at [phone], and voice mail not set up yet on cell.

## 2012-09-08 NOTE — Anesthesia Procedure Notes (Signed)
Procedure Name: Intubation Date/Time: 09/08/2012 3:33 PM Performed by: Coralee Rud Pre-anesthesia Checklist: Patient identified, Emergency Drugs available, Suction available, Patient being monitored and Timeout performed Patient Re-evaluated:Patient Re-evaluated prior to inductionOxygen Delivery Method: Circle system utilized Preoxygenation: Pre-oxygenation with 100% oxygen Intubation Type: IV induction Ventilation: Mask ventilation without difficulty Grade View: Grade I Tube type: Oral Tube size: 7.5 mm Number of attempts: 1 Airway Equipment and Method: Stylet and LTA kit utilized Placement Confirmation: ETT inserted through vocal cords under direct vision,  positive ETCO2 and breath sounds checked- equal and bilateral Secured at: 23 cm Tube secured with: Tape Dental Injury: Teeth and Oropharynx as per pre-operative assessment

## 2012-09-08 NOTE — Brief Op Note (Signed)
09/08/2012  5:44 PM  PATIENT:  Tollie Pizza Delamora  52 y.o. male  PRE-OPERATIVE DIAGNOSIS:  OPEN WOUND LEFT LEG   POST-OPERATIVE DIAGNOSIS:  OPEN WOUND LEFT LEG   PROCEDURE:  Procedure(s):  SPLIT THICKNESS SKIN GRAFT LEFT LEG  (Left) 40cm squared  SURGEON:  Surgeon(s) and Role:    * Budd Palmer, MD - Primary  PHYSICIAN ASSISTANT: Montez Morita, [facility]  ANESTHESIA:   general  EBL:  Total I/O In: 1300 [I.V.:1300] Out: -   BLOOD ADMINISTERED:none  DRAINS: none   LOCAL MEDICATIONS USED:  MARCAINE     SPECIMEN:  No Specimen  DISPOSITION OF SPECIMEN:  N/A  COUNTS:  YES  TOURNIQUET:  * No tourniquets in log *  DICTATION: .Other Dictation: Dictation Number [id number]  PLAN OF CARE: Admit to inpatient   PATIENT DISPOSITION:  PACU - hemodynamically stable.   Delay start of Pharmacological VTE agent (>24hrs) due to surgical blood loss or risk of bleeding: no

## 2012-09-08 NOTE — H&P (Signed)
[name] is an 52 y.o. male.   Chief Complaint: Open left leg fasciotomy HPI: s/p compartment syndrome release after bicondylar plateau; now returns for STSG, decreased cigs down to 3/ daily; home vac  Past Medical History  Diagnosis Date  . COPD (chronic obstructive pulmonary disease)   . Hypothyroidism   . BPH (benign prostatic hyperplasia)   . Anxiety   . Depression   . Cirrhosis   . Pancreatitis chronic   . Epicondylitis     hips  . Hepatitis C   . Bipolar disorder   . Oxygen decrease   . Shortness of breath   . GERD (gastroesophageal reflux disease)   . Head injury     Past Surgical History  Procedure Laterality Date  . Total hip arthroplasty  1 year ago    right hip-[name]  . Appendectomy  age 28  . Femur fracture surgery      otif  . Transurethral resection of prostate  05/23/2011    Procedure: TRANSURETHRAL RESECTION OF THE PROSTATE (TURP);  Surgeon: [name];  Location: [facility];  Service: Urology;  Laterality: N/A;  placement of suprapubic catheter  . Colonoscopy  07/22/2012    Procedure: COLONOSCOPY;  Surgeon: Brendan Hippo, MD;  Location: [facility];  Service: Endoscopy;  Laterality: N/A;  225-moved to 200 Ann notified pt  . Fasciotomy Left 08/30/2012    Procedure: FASCIOTOMY;  Surgeon: Brendan Docker, MD;  Location: [facility];  Service: Orthopedics;  Laterality: Left;    Family History  Problem Relation Age of Onset  . Anesthesia problems Neg Hx   . Hypotension Neg Hx   . Pseudochol deficiency Neg Hx   . Malignant hyperthermia Neg Hx    Social History:  reports that he has been smoking Cigarettes.  He has a 30 pack-year smoking history. He does not have any smokeless tobacco history on file. He reports that he uses illicit drugs (Marijuana). He reports that he does not drink alcohol.  Allergies:  Allergies  Allergen Reactions  . Penicillins Anaphylaxis  . Vancomycin     redman syndrome  . Codeine Hives    Medications Prior to  Admission  Medication Sig Dispense Refill  . buPROPion (WELLBUTRIN XL) 150 MG 24 hr tablet Take 150 mg by mouth every morning.        . DULoxetine (CYMBALTA) 60 MG capsule Take 60 mg by mouth 2 (two) times daily.        Marland Kitchen levothyroxine (SYNTHROID, LEVOTHROID) 88 MCG tablet Take 88 mcg by mouth daily.      Marland Kitchen lithium carbonate 300 MG capsule Take 300 mg by mouth 3 (three) times daily with meals.        Marland Kitchen oxyCODONE 10 MG TABS Take 1-2 tablets (10-20 mg total) by mouth every 4 (four) hours as needed.  60 tablet  0  . promethazine (PHENERGAN) 25 MG tablet Take 25 mg by mouth every 6 (six) hours as needed. Nausea and vomiting       . diazepam (VALIUM) 10 MG tablet Take 10 mg by mouth every 8 (eight) hours as needed. For anxiety       . fentaNYL (DURAGESIC - DOSED MCG/HR) 100 MCG/HR Place 3 patches onto the skin every other day. This dose was verified, it is correct        Results for orders placed during the hospital encounter of 09/08/12 (from the past 48 hour(s))  CBC     Status: Abnormal  Collection Time    09/08/12  1:33 PM      Result Value Range   WBC 16.6 (*) 4.0 - 10.5 K/uL   RBC 3.09 (*) 4.22 - 5.81 MIL/uL   Hemoglobin 9.3 (*) 13.0 - 17.0 g/dL   HCT 78.2 (*) 95.6 - 21.3 %   MCV 92.2  78.0 - 100.0 fL   MCH 30.1  26.0 - 34.0 pg   MCHC 32.6  30.0 - 36.0 g/dL   RDW 08.6  57.8 - 46.9 %   Platelets 587 (*) 150 - 400 K/uL   Dg Chest 2 View  09/08/2012  *RADIOLOGY REPORT*  Clinical Data: Preop pelvic surgery  CHEST - 2 VIEW  Comparison: 08/30/2012  Findings: Chronic interstitial markings.  Mild plate-like atelectasis in the right mid lung.  No pleural effusion or pneumothorax.  The heart is top normal in size.  Mild degenerative changes of the visualized thoracolumbar spine.  IMPRESSION: No evidence of acute cardiopulmonary disease.   Original Report Authenticated By: [name], M.D.     ROS: as above  Blood pressure 113/78, pulse 103, temperature 98.3 F (36.8 C), temperature  source Oral, resp. rate 18, SpO2 99.00%. PE:  older than stated age, conversant  RRR  No wheezing  LLE Sens DPN, SPN, TN intact; knee immob; wound vac  Motor EHL, ext, flex, evers 5/5  DP 2+, PT 2+   Assessment/Plan Open fasciotomy and bicondylar plateau left leg for STSG today vs primary closure, possible ORIF  Brendan Galas, MD [facility] [phone] (p)   09/08/2012, 2:59 PM

## 2012-09-08 NOTE — Anesthesia Preprocedure Evaluation (Signed)
Anesthesia Evaluation  Patient identified by MRN, date of birth, ID band Patient awake    Reviewed: Allergy & Precautions, H&P , NPO status , Patient's Chart, lab work & pertinent test results  Airway Mallampati: I TM Distance: >3 FB Neck ROM: Full    Dental   Pulmonary COPD         Cardiovascular     Neuro/Psych    GI/Hepatic GERD-  Medicated and Controlled,(+) Hepatitis -, C  Endo/Other    Renal/GU      Musculoskeletal   Abdominal   Peds  Hematology   Anesthesia Other Findings   Reproductive/Obstetrics                           Anesthesia Physical Anesthesia Plan  ASA: III  Anesthesia Plan: General   Post-op Pain Management:    Induction: Intravenous  Airway Management Planned: Oral ETT  Additional Equipment:   Intra-op Plan:   Post-operative Plan: Extubation in OR  Informed Consent: I have reviewed the patients History and Physical, chart, labs and discussed the procedure including the risks, benefits and alternatives for the proposed anesthesia with the patient or authorized representative who has indicated his/her understanding and acceptance.     Plan Discussed with: CRNA and Surgeon  Anesthesia Plan Comments:         Anesthesia Quick Evaluation

## 2012-09-08 NOTE — Transfer of Care (Signed)
Immediate Anesthesia Transfer of Care Note  Patient: [name]  Procedure(s) Performed: Procedure(s):  SPLIT THICKNESS SKIN GRAFT LEFT LEG  (Left)  Patient Location: PACU  Anesthesia Type:General  Level of Consciousness: awake, oriented and patient cooperative  Airway & Oxygen Therapy: Patient Spontanous Breathing and Patient connected to face mask oxygen  Post-op Assessment: Report given to PACU RN, Post -op Vital signs reviewed and stable and Patient moving all extremities  Post vital signs: Reviewed and stable  Complications: No apparent anesthesia complications

## 2012-09-09 ENCOUNTER — Encounter (HOSPITAL_COMMUNITY): Payer: Self-pay | Admitting: Orthopedic Surgery

## 2012-09-09 NOTE — Progress Notes (Signed)
Orthopedic Tech Progress Note Patient Details:  [name] 07/23/1960 [id number]  Patient ID: [name], male   DOB: 1961-05-10, 52 y.o.   MRN: [id number] Brace order completed by [facility]  [name] 09/09/2012, 6:46 PM

## 2012-09-09 NOTE — Progress Notes (Signed)
Utilization Review Completed.   Austine Kelsay, RN, BSN Nurse Case Manager  [phone]  

## 2012-09-09 NOTE — Care Management Note (Signed)
CARE MANAGEMENT NOTE 09/09/2012  Patient:  [name], [name]   Account Number:  [id number]  Date Initiated:  09/09/2012  Documentation initiated by:  Vance Peper  Subjective/Objective Assessment:   52 yr old male s/p split thickness skin grafting.     Action/Plan:   HH vs SNF to be determined   Anticipated DC Date:     Anticipated DC Plan:           Choice offered to / List presented to:             Status of service:  In process  Comments:  09/09/12 12:08pm Vance Peper, RN BSN Case Manager [phone] Patient's sister [name] [phone] contacted case manager concerning discharge plans for patient. She states that her mom, who has POA, is unable to care for him at home. CM contacted PA-Keith Renae Fickle and also notified Child psychotherapist. Will follow.

## 2012-09-09 NOTE — Op Note (Signed)
[name], [name]               ACCOUNT NO.:  [id number]  MEDICAL RECORD NO.:  [id number]  LOCATION:  [id number]                        FACILITY:  [facility]  PHYSICIAN:  Doralee Albino. Carola Frost, M.D. DATE OF BIRTH:  [birth year]  DATE OF PROCEDURE:  09/08/2012 DATE OF DISCHARGE:                              OPERATIVE REPORT   PREOPERATIVE DIAGNOSIS:  Open left leg lateral fasciotomy.  POSTOPERATIVE DIAGNOSIS:  Open left leg lateral fasciotomy.  PROCEDURE:  Split-thickness skin grafting, 40 squared centimeters.  SURGEON:  Doralee Albino. Carola Frost, M.D.  ASSISTANT:  Mearl Latin, [place].  ANESTHESIA:  General.  COMPLICATIONS:  None.  ESTIMATED BLOOD LOSS:  Minimal.  DISPOSITION:  To PACU.  CONDITION:  Stable.  BRIEF SUMMARY OF INDICATIONS OF PROCEDURE:  [name] is a 52 year old male who sustained a tibial plateau fracture complicated by compartment syndrome.  The patient underwent compartment fasciotomy by Dr. Jene Every and subsequent I and D with closure of the medial side by Dr. Teryl Lucy.  He has been in a wound VAC on the lateral side since that time, and now presents for split-thickness skin grafting versus delayed primary closure.  I did discuss with the patient risks and benefits of the surgery including the possibility of infection, nerve injury, vessel injury, DVT, PE, the need for further surgery, the likelihood of not being able to repair his plateau at this time because of the significant swelling and many others including pain and scarring. The patient understood these clearly and did wish to proceed.  BRIEF SUMMARY OF PROCEDURE:  [name] did receive preop antibiotics, taken to the operating room, and anesthesia was induced.  His left lower extremity was prepped and draped in the usual sterile fashion.  No tourniquet was used during the procedure.  The muscle appeared to be healthy.  There was a sealed subcu wound edge.  The wound measured 10 cm x 4 cm with  additional 2 proximal satellite areas that were 1 x 2 inch between the sutures.  The part of the suture material had torn some of the skin, which was in a probable condition because of the patient's multiple comorbidities.  A 10 x 4 graft was harvested using the 2-inch dermatome.  This was meshed 1:1.5 and then applied, sutured in place with a 5-0 Prolene and then a layer of Mepitel and wound VAC.  On the donor site, immediate application of Marcaine and epinephrine-soaked sponges were applied followed by a smooth sheet of Xeroform and then ABDs and Ace wrap for the thigh.  The patient was awakened from anesthesia and transported to the PACU in stable condition.  Montez Morita, PA-C did assist me throughout the procedure and was necessary for its effective completion as he grabbed the graft, and we worked together to suture it in place and with the thigh and calf wound simultaneously as well to further expedite the procedure.  PROGNOSIS:  [name] will need to return to the OR for repair of his tibial plateau.  I am very concerned the potential for complications given the multiple and severe comorbidities, which include hepatitis, pancreatitis, and many others.  The compartment syndrome with the poor skin condition and  the anterolateral incisions that would potentially result in increased chance of wound complications and the delayed manner in which could be addressing this fracture would could require additional dissection.  These may push [place] towards a nonoperative course despite my preference for surgical repair in the vast majority of cases. We will obtain a hinged knee brace and begin range of motion in the event that we opt for nonsurgical management.     Doralee Albino. Carola Frost, M.D.     [facility]/MEDQ  D:  09/08/2012  T:  09/09/2012  Job:  [id number]

## 2012-09-09 NOTE — Progress Notes (Signed)
Orthopaedic Trauma Service (OTS)  Subjective: 1 Day Post-Op Procedure(s) (LRB):  SPLIT THICKNESS SKIN GRAFT LEFT LEG  (Left) Patient reports pain as mild.   Interested in going home if arrangements can be made  Objective: Current Vitals Blood pressure 106/62, pulse 94, temperature 98.2 F (36.8 C), temperature source Oral, resp. rate 16, SpO2 97.00%. Vital signs in last 24 hours: Temp:  [98.2 F (36.8 C)-98.5 F (36.9 C)] 98.2 F (36.8 C) (03/19 0536) Pulse Rate:  [82-103] 94 (03/19 0536) Resp:  [12-18] 16 (03/19 0536) BP: (106-149)/(60-78) 106/62 mmHg (03/19 0536) SpO2:  [95 %-100 %] 97 % (03/19 0536)  Intake/Output from previous day: 03/18 0701 - 03/19 0700 In: 1400 [I.V.:1400] Out: 0   LABS  Recent Labs  09/08/12 1333  HGB 9.3*    Recent Labs  09/08/12 1333  WBC 16.6*  RBC 3.09*  HCT 28.5*  PLT 587*   No results found for this basename: NA, K, CL, CO2, BUN, CREATININE, GLUCOSE, CALCIUM,  in the last 72 hours No results found for this basename: LABPT, INR,  in the last 72 hours  Physical Exam   Conversant, A&O, no wheezing LLE Sens DPN, SPN, TN intact  Motor EHL, ext, flex, evers 5/5  DP 2+, PT 2+ Donor site looks excellent, recipient remains dressed with vac in place   Imaging Dg Chest 2 View  09/08/2012  *RADIOLOGY REPORT*  Clinical Data: Preop pelvic surgery  CHEST - 2 VIEW  Comparison: 08/30/2012  Findings: Chronic interstitial markings.  Mild plate-like atelectasis in the right mid lung.  No pleural effusion or pneumothorax.  The heart is top normal in size.  Mild degenerative changes of the visualized thoracolumbar spine.  IMPRESSION: No evidence of acute cardiopulmonary disease.   Original Report Authenticated By: Charline Bills, M.D.     Assessment/Plan: 1 Day Post-Op Procedure(s) (LRB):  SPLIT THICKNESS SKIN GRAFT LEFT LEG  (Left)  Fan to left thigh, leave xeroform in place Hinged knee brace tomorrow or Friday; will order and fit to  opposite leg today so ready to go on operative side at discharge Knee immobilizer when OOB today Vac to be removed on Fri and dc to home if not before  Myrene Galas, MD [facility] [phone] (p)   09/09/2012, 8:20 AM

## 2012-09-09 NOTE — Progress Notes (Signed)
Orthopedic Tech Progress Note Patient Details:  [name] 26-Jan-1961 [id number]  Patient ID: [name], male   DOB: 1960/09/23, 52 y.o.   MRN: [id number]   [name] 09/09/2012, 11:36 AMCALLED ADVANCED FOR HINGED KNEE BRACE.

## 2012-09-10 ENCOUNTER — Encounter (HOSPITAL_COMMUNITY): Payer: Self-pay | Admitting: Orthopedic Surgery

## 2012-09-10 DIAGNOSIS — T148XXA Other injury of unspecified body region, initial encounter: Secondary | ICD-10-CM

## 2012-09-10 DIAGNOSIS — G8929 Other chronic pain: Secondary | ICD-10-CM

## 2012-09-10 DIAGNOSIS — F319 Bipolar disorder, unspecified: Secondary | ICD-10-CM

## 2012-09-10 MED ORDER — DOXYCYCLINE HYCLATE 100 MG PO TABS: 100.0000 mg | ORAL_TABLET | Freq: Two times a day (BID) | ORAL | Status: DC

## 2012-09-10 NOTE — Discharge Summary (Signed)
Orthopaedic Trauma Service (OTS)  Patient ID: [name] MRN: [id number] DOB/AGE: 54-01-1961 52 y.o.  Admit date: 09/08/2012 Discharge date: 09/10/2012  Admission Diagnoses: Open fasciotomy left leg History of a closed left tibial plateau fracture approximately one week ago Acute compartment syndrome status post fasciotomies left leg, one week ago Hepatitis C COPD Anxiety Bipolar disorder Chronic pain Hypothyroidism Constipation  Discharge Diagnoses:  Principal Problem:   Open wound, L leg Active Problems:   Constipation   Hepatitis C   Hypothyroid   Tibia/fibula fracture   Traumatic compartment syndrome of left leg   COPD (chronic obstructive pulmonary disease)   Anxiety   Chronic pain   Bipolar disorder, unspecified   Procedures Performed: 09/08/2012- Dr. Carola Frost Split-thickness skin grafting, 40 squared centimeters   Discharged Condition: stable  Hospital Course:  Patient is a 52 year old Caucasian male who had a ground-level fall approximately one week ago for and ultimately he sustained a closed left tibial plateau fracture. He developed acute compartment syndrome in his left leg and was he was taken back to the or sequentially over the next several days for attempted closure. The medial wound of his left leg was able to be closed. Unable to close the lateral wound. He had a wound that placed on this. At the patient's request he was discharged home while he awaited subsequent surgery. Patient was taken back to the operating room on 09/08/2012 where he had split-thickness skin grafting to his left leg performed. Patient tolerated the procedure well. After surgery he was transferred to the PACU for recovery from anesthesia and was transferred to the orthopedic floor for continued observation as well as pain control. Patient did not have any complications or issues during his acute stay. There were some concerns expressed by his family that he was somewhat of a  burden and potentially providing too much stress on the caregivers at home and inquired about a nursing home stay. This was discussed with the patient but he felt that this was inaccurate. He felt very comfortable being at home and that felt safe mobilizing around with his walker. He states that he would maintain his nonweightbearing and follow all instructions to letter. On postoperative day #2 the patient was deemed stable for discharge to home. His VAC was changed at bedside by Dr. handy and myself this was then hooked up to his home wound VAC unit and his hinged knee brace was applied by [place] as well. Patient will not have to change this dressing over his split-thickness skin graft recipient site until he follows up with [place] on Monday. Again we did express our concerns with him going home given his limited mobility as well as his past medical history further complicating his recovery particularly his bipolar disorder for which the patient is known to "snap" at a moment's notice. Patient states that again he is a very comfortable returning to home.   weighted again reviewed explicitly instructions for the patient including strict nonweightbearing on his left leg. He is permitted range of motion of his knee in the hinged knee brace to the left side. No specific wound care for his skin graft site needs to be carried out however daily dressing changes to his medial left leg wound should be followed. Patient does remain at increased risk for nonunion delayed union and as well as wound healing complications given his medical history and lack of healing thus far.  Consults: None  Significant Diagnostic Studies: None   Treatments:  IV hydration, antibiotics: Clindamycin and doxycycline, anticoagulation: LMW heparin while inpatient and therapies: SW, RN  Discharge Exam:     Orthopaedic Trauma Service Progress Note     2 Days Post-Op  Subjective   Doing fine No specific complaints Does not want to go to SNF,  states he has lots of nurses that come check on him daily Pain controlled, described as soreness   Objective  BP 110/73  Pulse 98  Temp(Src) 98.4 F (36.9 C) (Oral)  Resp 18  SpO2 98%  Patient Vitals for the past 24 hrs:   BP  Temp  Temp src  Pulse  Resp  SpO2   09/10/12 1300  110/73 mmHg  98.4 F (36.9 C)  -  98  18  98 %   09/10/12 0607  114/65 mmHg  98.4 F (36.9 C)  Oral  94  18  96 %   09/09/12 2114  103/64 mmHg  97.8 F (36.6 C)  Oral  96  16  97 %     Intake/Output     03/19 0701 - 03/20 0700 03/20 0701 - 03/21 0700    P.O. 480     I.V.      Total Intake 480      Urine 895     Total Output 895 0    Net -415 0             Exam  Gen: awake and alert, NAD Lungs: unlabored Cardiac: reg Ext:         Left Lower Extremity             Donor site dressed with xeroform, looks excellent                          VAC changed on L leg lateral side             Graft looks stable             Swelling has decreased substantially             Distal motor and sensory functions intact             Ext warm             + DP pulse                          New vac sponge applied over mepitel, new dressing with compressive wrap applied, hinge brace applied     Assessment and Plan  2 Days Post-Op  52 y/o male s/p STSG L leg lateral fasciotomy site, POD 2  1. L tibial plateau fracture with compartment syndrome s/p STSG lateral wound             Strict NWB L leg             ROM L knee in hinge brace as tolerated             No vac changed needed until pt f/u at office on Monday             Ice and elevate above heart for swelling control             Dressing change to medial wound daily               Pt instructed not to remove xeroform from L thigh  Donor site instructions                                       DO NOT remove yellow xeroform layer for any reason                                       Pt to dab blood droplets as they form                                        Fan to xeroform layer to dry out site/sites                                     DO NOT cover donor sites with any dressings, allow them to stay             exposed to air                                       Gently trim edges as they roll up   given the tenuous nature of his soft tissue as well as some surrounding erythema patient will remain on doxycycline 100 mg po every 12 hours for the next 10 days.  2. Chronic pain             Pain meds per PCP             No new Rxs at discharge 3. Medical issues             Home meds   4. DVT/PE prophylaxis             Mobilization 5. FEN             Pt has demonstrated poor capacity for healing as demonstrated by lack of medial wound healing L leg             Supplement with MVI, vitamin E and C             Adequate diet as well             No smoking or etoh  6. Dispo             Pt refused SNF             D/c home today             Follow up with ortho on Monday 09/14/2012     Patient Active Problem List   Diagnosis   .  CHRONIC PANCREATITIS   .  Constipation   .  Cirrhosis   .  Hepatitis C   .  Hypothyroid   .  Fracture of ribs, right 11 & 12   .  Closed fracture of transverse process of lumbar vertebra L2 & L3   .  Tibia/fibula fracture   .  Traumatic compartment syndrome of left leg   .  COPD (chronic obstructive pulmonary disease)   .  Anxiety   .  Fall   .  Chronic pain   .  Bipolar disorder,  unspecified   .  Open wound, L leg      Disposition: 06-Home-Health Care Svc      Discharge Orders   Future Appointments Provider Department Dept Phone   10/07/2012 2:15 PM Len Blalock, NP [facility] [phone]   Future Orders Complete By Expires     Call MD / Call 911  As directed     Comments:      If you experience chest pain or shortness of breath, CALL 911 and be transported to the hospital emergency room.  If you develope a fever above 101 F, pus (white  drainage) or increased drainage or redness at the wound, or calf pain, call your surgeon's office.    Constipation Prevention  As directed     Comments:      Drink plenty of fluids.  Prune juice may be helpful.  You may use a stool softener, such as Colace (over the counter) 100 mg twice a day.  Use MiraLax (over the counter) for constipation as needed.    Diet general  As directed     Discharge instructions  As directed     Comments:      Orthopaedic Trauma Service Discharge Instructions   General Discharge Instructions  WEIGHT BEARING STATUS: Nonweightbearing left leg  RANGE OF MOTION/ACTIVITY: Range of motion as tolerated left knee and ankle  Wound care: Daily dressing changes 2 inside leg wound of left leg. Do not remove wound VAC until we see for followup on Monday, 09/14/2012. Do not remove yellow Xeroform dressing from the left thigh for any reason        Donor site instructions - left thigh  DO NOT remove yellow xeroform layer for any reason  Pt to dab blood droplets as they form  Fan to xeroform layer to dry out site/sites DO NOT cover donor sites with any dressings, allow them to stay  exposed to air  Gently trim edges as they roll up  Diet: as you were eating previously.  Can use over the counter stool softeners and bowel preparations, such as Miralax, to help with bowel movements.  Narcotics can be constipating.  Be sure to drink plenty of fluids   Recommend multivitamin as well as vitamin C and vitamin E to help with healing.  STOP SMOKING OR USING NICOTINE PRODUCTS!!!!  As discussed nicotine severely impairs your body's ability to heal surgical and traumatic wounds but also impairs bone healing.  Wounds and bone heal by forming microscopic blood vessels (angiogenesis) and nicotine is a vasoconstrictor (essentially, shrinks blood vessels).  Therefore, if vasoconstriction occurs to these microscopic blood vessels they essentially disappear and are unable to deliver necessary  nutrients to the healing tissue.  This is one modifiable factor that you can do to dramatically increase your chances of healing your injury.    (This means no smoking, no nicotine gum, patches, etc)  DO NOT USE NONSTEROIDAL ANTI-INFLAMMATORY DRUGS (NSAID'S)  Using products such as Advil (ibuprofen), Aleve (naproxen), Motrin (ibuprofen) for additional pain control during fracture healing can delay and/or prevent the healing response.  If you would like to take over the counter (OTC) medication, Tylenol (acetaminophen) is ok.  However, some narcotic medications that are given for pain control contain acetaminophen as well. Therefore, you should not exceed more than 4000 mg of tylenol in a day if you do not have liver disease.  Also note that there are may OTC medicines, such as cold medicines and allergy medicines  that my contain tylenol as well.  If you have any questions about medications and/or interactions please ask your doctor/PA or your pharmacist.   PAIN MEDICATION USE AND EXPECTATIONS  You have likely been given narcotic medications to help control your pain.  After a traumatic event that results in an fracture (broken bone) with or without surgery, it is ok to use narcotic pain medications to help control one's pain.  We understand that everyone responds to pain differently and each individual patient will be evaluated on a regular basis for the continued need for narcotic medications. Ideally, narcotic medication use should last no more than 6-8 weeks (coinciding with fracture healing).   As a patient it is your responsibility as well to monitor narcotic medication use and report the amount and frequency you use these medications when you come to your office visit.   We would also advise that if you are using narcotic medications, you should take a dose prior to therapy to maximize you participation.  IF YOU ARE ON NARCOTIC MEDICATIONS IT IS NOT PERMISSIBLE TO OPERATE A MOTOR VEHICLE  (MOTORCYCLE/CAR/TRUCK/MOPED) OR HEAVY MACHINERY DO NOT MIX NARCOTICS WITH OTHER CNS (CENTRAL NERVOUS SYSTEM) DEPRESSANTS SUCH AS ALCOHOL       ICE AND ELEVATE INJURED/OPERATIVE EXTREMITY  Using ice and elevating the injured extremity above your heart can help with swelling and pain control.  Icing in a pulsatile fashion, such as 20 minutes on and 20 minutes off, can be followed.    Do not place ice directly on skin. Make sure there is a barrier between to skin and the ice pack.    Using frozen items such as frozen peas works well as the conform nicely to the are that needs to be iced.  USE AN ACE WRAP OR TED HOSE FOR SWELLING CONTROL  In addition to icing and elevation, Ace wraps or TED hose are used to help limit and resolve swelling.  It is recommended to use Ace wraps or TED hose until you are informed to stop.    When using Ace Wraps start the wrapping distally (farthest away from the body) and wrap proximally (closer to the body)   Example: If you had surgery on your leg or thing and you do not have a splint on, start the ace wrap at the toes and work your way up to the thigh        If you had surgery on your upper extremity and do not have a splint on, start the ace wrap at your fingers and work your way up to the upper arm  IF YOU ARE IN A SPLINT OR CAST DO NOT REMOVE IT FOR ANY REASON   If your splint gets wet for any reason please contact the office immediately. You may shower in your splint or cast as long as you keep it dry.  This can be done by wrapping in a cast cover or garbage back (or similar)  Do Not stick any thing down your splint or cast such as pencils, money, or hangers to try and scratch yourself with.  If you feel itchy take benadryl as prescribed on the bottle for itching  IF YOU ARE IN A CAM BOOT (BLACK BOOT)  You may remove boot periodically. Perform daily dressing changes as noted below.  Wash the liner of the boot regularly and wear a sock when wearing the boot. It  is recommended that you sleep in the boot until told otherwise  CALL THE OFFICE  WITH ANY QUESTIONS OR CONCERTS: [phone]    Discharge Wound Care Instructions  Do NOT apply any ointments, solutions or lotions to pin sites or surgical wounds.  These prevent needed drainage and even though solutions like hydrogen peroxide kill bacteria, they also damage cells lining the pin sites that help fight infection.  Applying lotions or ointments can keep the wounds moist and can cause them to breakdown and open up as well. This can increase the risk for infection. When in doubt call the office.  Surgical incisions should be dressed daily.    Do not put a pillow under the knee. Place it under the heel.  As directed     Driving restrictions  As directed     Comments:      No driving    Increase activity slowly as tolerated  As directed     Non weight bearing  As directed         Medication List    TAKE these medications       buPROPion 150 MG 24 hr tablet  Commonly known as:  WELLBUTRIN XL  Take 150 mg by mouth every morning.     diazepam 10 MG tablet  Commonly known as:  VALIUM  Take 10 mg by mouth every 8 (eight) hours as needed. For anxiety     doxycycline 100 MG tablet  Commonly known as:  VIBRA-TABS  Take 1 tablet (100 mg total) by mouth every 12 (twelve) hours.     DULoxetine 60 MG capsule  Commonly known as:  CYMBALTA  Take 60 mg by mouth 2 (two) times daily.     fentaNYL 100 MCG/HR  Commonly known as:  DURAGESIC - dosed mcg/hr  Place 3 patches onto the skin every other day. This dose was verified, it is correct     levothyroxine 88 MCG tablet  Commonly known as:  SYNTHROID, LEVOTHROID  Take 88 mcg by mouth daily.     lithium carbonate 300 MG capsule  Take 300 mg by mouth 3 (three) times daily with meals.     Oxycodone HCl 10 MG Tabs  Take 1-2 tablets (10-20 mg total) by mouth every 4 (four) hours as needed.     promethazine 25 MG tablet  Commonly known as:   PHENERGAN  Take 25 mg by mouth every 6 (six) hours as needed. Nausea and vomiting       Follow-up Information   Follow up with Budd Palmer, MD.   Contact information:   [address] [place] [address] [phone]       Discharge Instructions and Plan:  Patient has sustained extensive injury to his left leg further complicated by acute compartment syndrome. Patient limited capacity to heal his wound and I would suspect that this would translate to bone healing as well. Patient's medical history further puts him at risk for complications. We are concerned about his ability to maintain compliance. As of right now is up plateau fracture does light up fairly well and it is possible that we can treat this nonoperatively as we do anticipate a long healing process for his wound. In addition intraoperatively his soft tissue to the left leg did not look all that healthy and to further complicate any healing if we were so to proceed with open reduction internal fixation.  The patient will maintain strict nonweightbearing on his left leg for the next 8 weeks. He is allowed unrestricted range of motion of his  left knee. I did review wound care structures with the patient and his mother. There also included in his discharge paperwork. Patient will followup in the  4 days for a VAC removaland evaluation of his skin graft recipient site   patient was not given any narcotics at discharge as these are being written for by his PCP    Signed:  Mearl Latin, PA-C [facility] [phone] (P) 09/10/2012, 6:11 PM

## 2012-09-10 NOTE — Progress Notes (Signed)
Pt discharged to home accompanied by mother. Discharge instructions and rx given and explained and pt stated understanding. Pts IV was removed prior to discharge. Pt left unit in a stable condition via wheelchair.

## 2012-09-10 NOTE — Progress Notes (Signed)
Orthopaedic Trauma Service Progress Note  2 Days Post-Op  Subjective   Doing fine No specific complaints Does not want to go to SNF, states he has lots of nurses that come check on him daily Pain controlled, described as soreness   Objective  BP 110/73  Pulse 98  Temp(Src) 98.4 F (36.9 C) (Oral)  Resp 18  SpO2 98%  Patient Vitals for the past 24 hrs:  BP Temp Temp src Pulse Resp SpO2  09/10/12 1300 110/73 mmHg 98.4 F (36.9 C) - 98 18 98 %  09/10/12 0607 114/65 mmHg 98.4 F (36.9 C) Oral 94 18 96 %  09/09/12 2114 103/64 mmHg 97.8 F (36.6 C) Oral 96 16 97 %    Intake/Output     03/19 0701 - 03/20 0700 03/20 0701 - 03/21 0700   P.O. 480    I.V.     Total Intake 480     Urine 895    Total Output 895 0   Net -415 0           Exam  Gen: awake and alert, NAD Lungs: unlabored Cardiac: reg Ext:         Left Lower Extremity  Donor site dressed with xeroform, looks excellent    VAC changed on L leg lateral side  Graft looks stable  Swelling has decreased substantially  Distal motor and sensory functions intact  Ext warm  + DP pulse    New vac sponge applied over mepitel, new dressing with compressive wrap applied, hinge brace applied     Assessment and Plan  2 Days Post-Op  52 y/o male s/p STSG L leg lateral fasciotomy site, POD 2  1. L tibial plateau fracture with compartment syndrome s/p STSG lateral wound  Strict NWB L leg  ROM L knee in hinge brace as tolerated  No vac changed needed until pt f/u at office on Monday  Ice and elevate above heart for swelling control  Dressing change to medial wound daily    Pt instructed not to remove xeroform from L thigh    Donor site instructions      DO NOT remove yellow xeroform layer for any reason     Pt to dab blood droplets as they form     Fan to xeroform layer to dry out site/sites    DO NOT cover donor sites with any dressings, allow them to stay  exposed to air     Gently trim edges as they  roll up   given the tenuous nature of his soft tissue as well as some surrounding erythema patient will remain on doxycycline 100 mg po every 12 hours for the next 10 days.  2. Chronic pain  Pain meds per PCP  No new Rxs at discharge 3. Medical issues  Home meds  4. DVT/PE prophylaxis  Mobilization 5. FEN  Pt has demonstrated poor capacity for healing as demonstrated by lack of medial wound healing L leg  Supplement with MVI, vitamin E and C  Adequate diet as well  No smoking or etoh  6. Dispo  Pt refused SNF  D/c home today  Follow up with ortho on Monday 09/14/2012   Patient Active Problem List  Diagnosis  . CHRONIC PANCREATITIS  . Constipation  . Cirrhosis  . Hepatitis C  . Hypothyroid  . Fracture of ribs, right 11 & 12  . Closed fracture of transverse process of lumbar vertebra L2 & L3  . Tibia/fibula fracture  .  Traumatic compartment syndrome of left leg  . COPD (chronic obstructive pulmonary disease)  . Anxiety  . Fall  . Chronic pain  . Bipolar disorder, unspecified  . Open wound, L leg     Mearl Latin, PA-C [facility] [phone] (P) 09/10/2012 5:50 PM

## 2012-09-10 NOTE — Clinical Social Work Psychosocial (Signed)
Clinical Social Work Department  BRIEF PSYCHOSOCIAL ASSESSMENT  Patient:[name]  Account Number: [id number]  Admit date: 09/08/12 Clinical Social Worker Sabino Niemann, MSW Date/Time: 09/09/12 Referred by: Physician Date Referred: 09/08/12 Referred for   SNF Placement   Other Referral:  Interview type: Patient and patient's mother Other interview type: PSYCHOSOCIAL DATA  Living Status: with mother Admitted from facility:  Level of care:  Primary support name:Law,Geraldine- Medical POA   Primary support relationship to patient: mother Degree of support available:  Strong and vested  CURRENT CONCERNS  Current Concerns   Post-Acute Placement   Other Concerns:  SOCIAL WORK ASSESSMENT / PLAN  CSW met with pt re: PT recommendation for SNF.   Pt lives with his mother in [place]- Patient's mother reports she cannot take care of the patient in his current state.  CSW explained placement process and answered questions.   Pt reports [facility] [place] as her preference    CSW completed FL2 and started SNF search    Assessment/plan status: Information/Referral to [facility]  Other assessment/ plan:  Information/referral to community resources:  SNF   PTAR   PATIENT'S/FAMILY'S RESPONSE TO PLAN OF CARE:  Patient's mother reports that she is agreeable to the patient going to  ST SNF in order to increase strength and independence with mobility prior to returning home  Pt's mother verbalized understanding of placement process and appreciation for CSW assist. -The PA is going to speak to the patient about the need to go to a facility. Patient is not apt to going at this time  Sabino Niemann, MSW [phone]

## 2012-09-10 NOTE — Progress Notes (Signed)
Clinical Social Work Department  CLINICAL SOCIAL WORK PLACEMENT NOTE  09/09/2012  Patient: [name] Account Number: [id number]  Admit date: 09/08/12  Clinical Social Worker: Sabino Niemann MSW Date/time: 09/09/2012 11:30 AM  Clinical Social Work is seeking post-discharge placement for this patient at the following level of care: SKILLED NURSING (*CSW will update this form in Epic as items are completed)  09/09/2012 Patient/family provided with [facility] Department of Clinical Social Work's list of facilities offering this level of care within the geographic area requested by the patient (or if unable, by the patient's family).  09/09/2012 Patient/family informed of their freedom to choose among providers that offer the needed level of care, that participate in Medicare, Medicaid or managed care program needed by the patient, have an available bed and are willing to accept the patient.  09/09/2012 Patient/family informed of [facility]' ownership interest in [facility], as well as of the fact that they are under no obligation to receive care at this facility.  PASARR submitted to EDS on 09/09/12  PASARR number received from EDS on 09/09/12   FL2 transmitted to all facilities in geographic area requested by pt/family on 09/09/2012  FL2 transmitted to all facilities within larger geographic area on  Patient informed that his/her managed care company has contracts with or will negotiate with certain facilities, including the following:  Patient/family informed of bed offers received:  Patient chooses bed at  Physician recommends and patient chooses bed at  Patient to be transferred to on  Patient to be transferred to facility by  The following physician request were entered in Epic:  Additional Comments:

## 2012-09-11 NOTE — Care Management Note (Signed)
CARE MANAGEMENT NOTE 09/11/2012  Patient:  ROBEN, SCHLIEP   Account Number:  [id number]  Date Initiated:  09/09/2012  Documentation initiated by:  Vance Peper  Subjective/Objective Assessment:   52 yr old male s/p split thickness skin grafting.     Action/Plan:   HH vs SNF to be determined  09/10/12-Patient discharged home with mom. refused SNF. Wound vac from home applied, not to be removed until return to MD office in 4 days.   Anticipated DC Date:  09/11/2012   Anticipated DC Plan:  HOME/SELF CARE      DC Planning Services  CM consult      Choice offered to / List presented to:             Status of service:  Completed, signed off Medicare Important Message given?   (If response is "NO", the following Medicare IM given date fields will be blank) Date Medicare IM given:   Date Additional Medicare IM given:    Discharge Disposition:  HOME/SELF CARE  Per UR Regulation:    If discussed at Long Length of Stay Meetings, dates discussed:    Comments:  09/09/12 12:08pm Vance Peper, RN BSN Case Manager [phone] Patient's sister Gwyndolyn Saxon [phone] contacted case manager concerning discharge plans for patient. She states that her mom, who has POA, is unable to care for him at home. CM contacted PA-Keith Renae Fickle and also notified Child psychotherapist. Will follow.

## 2012-09-15 NOTE — Progress Notes (Signed)
Late Entry: Patient was discharged late in the evening to home. SW signing off.  Sabino Niemann, MSW,  [phone]

## 2012-10-07 ENCOUNTER — Ambulatory Visit (INDEPENDENT_AMBULATORY_CARE_PROVIDER_SITE_OTHER): Payer: Medicaid Other | Admitting: Internal Medicine

## 2012-11-11 ENCOUNTER — Emergency Department (HOSPITAL_COMMUNITY)
Admission: EM | Admit: 2012-11-11 | Discharge: 2012-11-11 | Disposition: A | Discharge status: Home / Self Care | Payer: Medicaid Other | Attending: Emergency Medicine | Admitting: Emergency Medicine

## 2012-11-11 ENCOUNTER — Encounter (HOSPITAL_COMMUNITY): Payer: Self-pay | Admitting: *Deleted

## 2012-11-11 DIAGNOSIS — E039 Hypothyroidism, unspecified: Secondary | ICD-10-CM | POA: Insufficient documentation

## 2012-11-11 DIAGNOSIS — Z8619 Personal history of other infectious and parasitic diseases: Secondary | ICD-10-CM | POA: Insufficient documentation

## 2012-11-11 DIAGNOSIS — Z79899 Other long term (current) drug therapy: Secondary | ICD-10-CM | POA: Insufficient documentation

## 2012-11-11 DIAGNOSIS — J4489 Other specified chronic obstructive pulmonary disease: Secondary | ICD-10-CM | POA: Insufficient documentation

## 2012-11-11 DIAGNOSIS — G8929 Other chronic pain: Secondary | ICD-10-CM | POA: Insufficient documentation

## 2012-11-11 DIAGNOSIS — Z8781 Personal history of (healed) traumatic fracture: Secondary | ICD-10-CM | POA: Insufficient documentation

## 2012-11-11 DIAGNOSIS — F17200 Nicotine dependence, unspecified, uncomplicated: Secondary | ICD-10-CM | POA: Insufficient documentation

## 2012-11-11 DIAGNOSIS — J449 Chronic obstructive pulmonary disease, unspecified: Secondary | ICD-10-CM | POA: Insufficient documentation

## 2012-11-11 DIAGNOSIS — Z880 Allergy status to penicillin: Secondary | ICD-10-CM | POA: Insufficient documentation

## 2012-11-11 DIAGNOSIS — F411 Generalized anxiety disorder: Secondary | ICD-10-CM | POA: Insufficient documentation

## 2012-11-11 DIAGNOSIS — M79609 Pain in unspecified limb: Secondary | ICD-10-CM | POA: Insufficient documentation

## 2012-11-11 DIAGNOSIS — Z8719 Personal history of other diseases of the digestive system: Secondary | ICD-10-CM | POA: Insufficient documentation

## 2012-11-11 DIAGNOSIS — Z792 Long term (current) use of antibiotics: Secondary | ICD-10-CM | POA: Insufficient documentation

## 2012-11-11 DIAGNOSIS — Z87828 Personal history of other (healed) physical injury and trauma: Secondary | ICD-10-CM | POA: Insufficient documentation

## 2012-11-11 DIAGNOSIS — F319 Bipolar disorder, unspecified: Secondary | ICD-10-CM | POA: Insufficient documentation

## 2012-11-11 DIAGNOSIS — Z8739 Personal history of other diseases of the musculoskeletal system and connective tissue: Secondary | ICD-10-CM | POA: Insufficient documentation

## 2012-11-11 DIAGNOSIS — Z87448 Personal history of other diseases of urinary system: Secondary | ICD-10-CM | POA: Insufficient documentation

## 2012-11-11 LAB — BASIC METABOLIC PANEL
BUN: <3 mg/dL — ABNORMAL LOW (ref 6–23)
Calcium: 9.2 mg/dL (ref 8.4–10.5)
Creatinine, Ser: 0.86 mg/dL (ref 0.50–1.35)
GFR calc Af Amer: >90 mL/min (ref >90)
GFR calc non Af Amer: >90 mL/min (ref >90)
Glucose, Bld: 96 mg/dL (ref 70–99)

## 2012-11-11 LAB — CBC WITH DIFFERENTIAL/PLATELET
Basophils Relative: 1 % (ref 0–1)
Eosinophils Absolute: 0.4 10*3/uL (ref 0.0–0.7)
Eosinophils Relative: 3 % (ref 0–5)
Hemoglobin: 12.9 g/dL — ABNORMAL LOW (ref 13.0–17.0)
Lymphs Abs: 4.8 10*3/uL — ABNORMAL HIGH (ref 0.7–4.0)
MCH: 27.7 pg (ref 26.0–34.0)
MCHC: 31.8 g/dL (ref 30.0–36.0)
MCV: 87.1 fL (ref 78.0–100.0)
Monocytes Relative: 8 % (ref 3–12)
Neutrophils Relative %: 52 % (ref 43–77)
RBC: 4.66 MIL/uL (ref 4.22–5.81)

## 2012-11-11 LAB — URINALYSIS, ROUTINE W REFLEX MICROSCOPIC
Bilirubin Urine: NEGATIVE
Hgb urine dipstick: NEGATIVE
Ketones, ur: NEGATIVE mg/dL
Nitrite: NEGATIVE
Protein, ur: NEGATIVE mg/dL
Urobilinogen, UA: 0.2 mg/dL (ref 0.0–1.0)

## 2012-11-11 LAB — RAPID URINE DRUG SCREEN, HOSP PERFORMED
Barbiturates: NOT DETECTED
Cocaine: NOT DETECTED
Tetrahydrocannabinol: POSITIVE — AB

## 2012-11-11 LAB — ETHANOL: Alcohol, Ethyl (B): <11 mg/dL (ref 0–11)

## 2012-11-11 MED ORDER — FENTANYL 100 MCG/HR TD PT72
300.0000 ug | MEDICATED_PATCH | TRANSDERMAL | Status: DC
  Filled 2012-11-11: qty 3

## 2012-11-11 MED ORDER — POTASSIUM CHLORIDE CRYS ER 20 MEQ PO TBCR: 60.0000 meq | EXTENDED_RELEASE_TABLET | Freq: Once | ORAL | Status: DC

## 2012-11-11 MED ORDER — FENTANYL 50 MCG/HR TD PT72: 300.0000 ug | MEDICATED_PATCH | TRANSDERMAL | Status: DC

## 2012-11-11 MED ORDER — FENTANYL 50 MCG/HR TD PT72: 100.0000 ug | MEDICATED_PATCH | TRANSDERMAL | Status: DC

## 2012-11-11 MED ORDER — FENTANYL 50 MCG/HR TD PT72
100.0000 ug | MEDICATED_PATCH | TRANSDERMAL | Status: DC
  Administered 2012-11-11: 50 ug via TRANSDERMAL

## 2012-11-11 MED ORDER — POTASSIUM CHLORIDE CRYS ER 20 MEQ PO TBCR
60.0000 meq | EXTENDED_RELEASE_TABLET | Freq: Once | ORAL | Status: AC
  Administered 2012-11-11: 60 meq via ORAL
  Filled 2012-11-11: qty 3

## 2012-11-11 MED ORDER — FENTANYL 50 MCG/HR TD PT72
100.0000 ug | MEDICATED_PATCH | TRANSDERMAL | Status: DC
  Filled 2012-11-11: qty 2
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
## 2012-11-11 NOTE — ED Notes (Signed)
IVC rescinded following EDP evaluation.  Pt to be discharged home, to follow up with surgeon this morning.

## 2012-11-11 NOTE — ED Notes (Signed)
Pt only wanted to recieve of duragesic, as he will be seeing surgeon later this morning and will be receiving remainder at that time.
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
## 2012-11-11 NOTE — ED Notes (Signed)
CRITICAL LAB VALUE  K+   2.6  Dr Colon Branch notified

## 2013-02-01 ENCOUNTER — Encounter: Payer: Self-pay | Admitting: Internal Medicine

## 2013-02-17 ENCOUNTER — Other Ambulatory Visit (HOSPITAL_COMMUNITY): Payer: Self-pay | Admitting: Orthopedic Surgery

## 2013-02-17 DIAGNOSIS — S82109A Unspecified fracture of upper end of unspecified tibia, initial encounter for closed fracture: Secondary | ICD-10-CM

## 2013-02-17 DIAGNOSIS — M25562 Pain in left knee: Secondary | ICD-10-CM

## 2013-02-19 ENCOUNTER — Ambulatory Visit (HOSPITAL_COMMUNITY)
Admission: RE | Admit: 2013-02-19 | Discharge: 2013-02-19 | Disposition: A | Discharge status: Home / Self Care | Payer: Medicaid Other | Source: Ambulatory Visit | Attending: Orthopedic Surgery | Admitting: Orthopedic Surgery

## 2013-02-19 DIAGNOSIS — S82109A Unspecified fracture of upper end of unspecified tibia, initial encounter for closed fracture: Secondary | ICD-10-CM

## 2013-02-19 DIAGNOSIS — M25562 Pain in left knee: Secondary | ICD-10-CM

## 2013-02-19 DIAGNOSIS — Z4789 Encounter for other orthopedic aftercare: Secondary | ICD-10-CM | POA: Insufficient documentation

## 2013-02-19 DIAGNOSIS — M25569 Pain in unspecified knee: Secondary | ICD-10-CM | POA: Insufficient documentation

## 2013-08-02 ENCOUNTER — Ambulatory Visit (HOSPITAL_COMMUNITY): Payer: Medicaid Other | Admitting: Psychiatry

## 2013-08-16 ENCOUNTER — Ambulatory Visit (HOSPITAL_COMMUNITY): Payer: Medicaid Other | Admitting: Psychiatry

## 2013-11-09 ENCOUNTER — Ambulatory Visit (HOSPITAL_COMMUNITY)
Admission: RE | Admit: 2013-11-09 | Discharge: 2013-11-09 | Disposition: A | Discharge status: Home / Self Care | Payer: Medicaid Other | Source: Ambulatory Visit | Attending: Pulmonary Disease | Admitting: Pulmonary Disease

## 2013-11-09 ENCOUNTER — Other Ambulatory Visit (HOSPITAL_COMMUNITY): Payer: Self-pay | Admitting: Pulmonary Disease

## 2013-11-09 DIAGNOSIS — W19XXXA Unspecified fall, initial encounter: Secondary | ICD-10-CM | POA: Diagnosis not present

## 2013-11-09 DIAGNOSIS — F29 Unspecified psychosis not due to a substance or known physiological condition: Secondary | ICD-10-CM | POA: Insufficient documentation

## 2013-11-09 DIAGNOSIS — R52 Pain, unspecified: Secondary | ICD-10-CM

## 2013-11-09 DIAGNOSIS — M79609 Pain in unspecified limb: Secondary | ICD-10-CM | POA: Diagnosis present

## 2013-11-09 DIAGNOSIS — R410 Disorientation, unspecified: Secondary | ICD-10-CM

## 2013-12-21 ENCOUNTER — Encounter (INDEPENDENT_AMBULATORY_CARE_PROVIDER_SITE_OTHER): Payer: Self-pay | Admitting: *Deleted

## 2013-12-29 ENCOUNTER — Ambulatory Visit (INDEPENDENT_AMBULATORY_CARE_PROVIDER_SITE_OTHER): Payer: Medicaid Other | Admitting: Internal Medicine

## 2014-01-07 ENCOUNTER — Telehealth (INDEPENDENT_AMBULATORY_CARE_PROVIDER_SITE_OTHER): Payer: Self-pay | Admitting: *Deleted

## 2014-01-07 ENCOUNTER — Encounter (INDEPENDENT_AMBULATORY_CARE_PROVIDER_SITE_OTHER): Payer: Self-pay | Admitting: *Deleted

## 2014-01-07 NOTE — Telephone Encounter (Signed)
noted 

## 2014-01-07 NOTE — Telephone Encounter (Signed)
[place] No Showed for his apt with Deberah Castle, NP on 12/29/13. A No Show letter has been mailed.

## 2014-01-23 ENCOUNTER — Emergency Department (HOSPITAL_COMMUNITY)
Admission: EM | Admit: 2014-01-23 | Discharge: 2014-01-23 | Disposition: A | Discharge status: Home / Self Care | Payer: Medicaid Other | Attending: Emergency Medicine | Admitting: Emergency Medicine

## 2014-01-23 ENCOUNTER — Emergency Department (HOSPITAL_COMMUNITY): Payer: Medicaid Other

## 2014-01-23 ENCOUNTER — Encounter (HOSPITAL_COMMUNITY): Payer: Self-pay | Admitting: Emergency Medicine

## 2014-01-23 DIAGNOSIS — R296 Repeated falls: Secondary | ICD-10-CM | POA: Insufficient documentation

## 2014-01-23 DIAGNOSIS — Y9389 Activity, other specified: Secondary | ICD-10-CM | POA: Insufficient documentation

## 2014-01-23 DIAGNOSIS — Z8619 Personal history of other infectious and parasitic diseases: Secondary | ICD-10-CM | POA: Insufficient documentation

## 2014-01-23 DIAGNOSIS — E039 Hypothyroidism, unspecified: Secondary | ICD-10-CM | POA: Insufficient documentation

## 2014-01-23 DIAGNOSIS — N400 Enlarged prostate without lower urinary tract symptoms: Secondary | ICD-10-CM | POA: Insufficient documentation

## 2014-01-23 DIAGNOSIS — Y929 Unspecified place or not applicable: Secondary | ICD-10-CM | POA: Diagnosis not present

## 2014-01-23 DIAGNOSIS — S335XXA Sprain of ligaments of lumbar spine, initial encounter: Secondary | ICD-10-CM | POA: Diagnosis not present

## 2014-01-23 DIAGNOSIS — J449 Chronic obstructive pulmonary disease, unspecified: Secondary | ICD-10-CM | POA: Insufficient documentation

## 2014-01-23 DIAGNOSIS — F17200 Nicotine dependence, unspecified, uncomplicated: Secondary | ICD-10-CM | POA: Diagnosis not present

## 2014-01-23 DIAGNOSIS — J4489 Other specified chronic obstructive pulmonary disease: Secondary | ICD-10-CM | POA: Insufficient documentation

## 2014-01-23 DIAGNOSIS — S53402A Unspecified sprain of left elbow, initial encounter: Secondary | ICD-10-CM

## 2014-01-23 DIAGNOSIS — M2540 Effusion, unspecified joint: Secondary | ICD-10-CM | POA: Diagnosis present

## 2014-01-23 DIAGNOSIS — S39012A Strain of muscle, fascia and tendon of lower back, initial encounter: Secondary | ICD-10-CM

## 2014-01-23 DIAGNOSIS — F411 Generalized anxiety disorder: Secondary | ICD-10-CM | POA: Diagnosis not present

## 2014-01-23 DIAGNOSIS — Z880 Allergy status to penicillin: Secondary | ICD-10-CM | POA: Insufficient documentation

## 2014-01-23 DIAGNOSIS — F319 Bipolar disorder, unspecified: Secondary | ICD-10-CM | POA: Insufficient documentation

## 2014-01-23 DIAGNOSIS — IMO0002 Reserved for concepts with insufficient information to code with codable children: Secondary | ICD-10-CM | POA: Diagnosis not present

## 2014-01-23 DIAGNOSIS — Z87828 Personal history of other (healed) physical injury and trauma: Secondary | ICD-10-CM | POA: Insufficient documentation

## 2014-01-23 DIAGNOSIS — Z79899 Other long term (current) drug therapy: Secondary | ICD-10-CM | POA: Insufficient documentation

## 2014-01-23 MED ORDER — OXYCODONE HCL 5 MG PO TABS
5.0000 mg | ORAL_TABLET | Freq: Once | ORAL | Status: AC
  Administered 2014-01-23: 5 mg via ORAL
  Filled 2014-01-23: qty 1

## 2014-01-23 NOTE — ED Provider Notes (Signed)
CSN: [id number]     Arrival date & time 01/23/14  0129 History   First MD Initiated Contact with Patient 01/23/14 [phone]     Chief Complaint  Patient presents with  . Joint Swelling      Patient is a 53 y.o. male presenting with fall. The history is provided by the patient.  Fall This is a new problem. The current episode started 3 to 5 hours ago. Pertinent negatives include no chest pain, no abdominal pain and no headaches. Exacerbated by: movement. The symptoms are relieved by rest.  pt reports he tripped several hours ago injuring his left elbow and low back He reports he may have hit his head with brief LOC, but no current HA/nausea/vomiting No neck pain No chest or abd pain He can ambulate  He reports most of his pain is in his left elbow/low back  Past Medical History  Diagnosis Date  . COPD (chronic obstructive pulmonary disease)   . Hypothyroidism   . BPH (benign prostatic hyperplasia)   . Anxiety   . Depression   . Cirrhosis   . Pancreatitis chronic   . Epicondylitis     hips  . Hepatitis C   . Bipolar disorder   . Oxygen decrease   . Shortness of breath   . GERD (gastroesophageal reflux disease)   . Head injury    Past Surgical History  Procedure Laterality Date  . Total hip arthroplasty  1 year ago    right hip-[place]  . Appendectomy  age 56  . Femur fracture surgery      otif  . Transurethral resection of prostate  05/23/2011    Procedure: TRANSURETHRAL RESECTION OF THE PROSTATE (TURP);  Surgeon: Marissa Nestle;  Location: [facility];  Service: Urology;  Laterality: N/A;  placement of suprapubic catheter  . Colonoscopy  07/22/2012    Procedure: COLONOSCOPY;  Surgeon: Rogene Houston, MD;  Location: [facility];  Service: Endoscopy;  Laterality: N/A;  225-moved to 200 Ann notified pt  . Fasciotomy Left 08/30/2012    Procedure: FASCIOTOMY;  Surgeon: Johnn Hai, MD;  Location: [facility];  Service: Orthopedics;  Laterality: Left;  . I&d extremity Left  09/03/2012    Procedure: IRRIGATION AND DEBRIDEMENT EXTREMITY;  Surgeon: Johnny Bridge, MD;  Location: [place];  Service: Orthopedics;  Laterality: Left;  . Skin split graft Left 09/08/2012    Procedure:  SPLIT THICKNESS SKIN GRAFT LEFT LEG ;  Surgeon: Rozanna Box, MD;  Location: [place];  Service: Orthopedics;  Laterality: Left;   Family History  Problem Relation Age of Onset  . Anesthesia problems Neg Hx   . Hypotension Neg Hx   . Pseudochol deficiency Neg Hx   . Malignant hyperthermia Neg Hx    History  Substance Use Topics  . Smoking status: Current Every Day Smoker -- 1.00 packs/day for 30 years    Types: Cigarettes  . Smokeless tobacco: Not on file  . Alcohol Use: Yes    Review of Systems  Cardiovascular: Negative for chest pain.  Gastrointestinal: Negative for abdominal pain.  Neurological: Negative for headaches.      Allergies  Penicillins; Vancomycin; Codeine; and Tylenol  Home Medications   Prior to Admission medications   Medication Sig Start Date End Date Taking? Authorizing Provider  buPROPion (WELLBUTRIN XL) 150 MG 24 hr tablet Take 150 mg by mouth every morning.     Yes Historical Provider, MD  diazepam (VALIUM) 10 MG tablet Take  10 mg by mouth every 8 (eight) hours as needed. For anxiety    Yes Historical Provider, MD  DULoxetine (CYMBALTA) 60 MG capsule Take 60 mg by mouth 2 (two) times daily.     Yes Historical Provider, MD  fentaNYL (DURAGESIC - DOSED MCG/HR) 100 MCG/HR Place 3 patches onto the skin every other day. This dose was verified, it is correct   Yes Historical Provider, MD  levothyroxine (SYNTHROID, LEVOTHROID) 88 MCG tablet Take 88 mcg by mouth daily.   Yes Historical Provider, MD  lithium carbonate 300 MG capsule Take 300 mg by mouth 3 (three) times daily with meals.     Yes Historical Provider, MD  oxyCODONE 10 MG TABS Take 1-2 tablets (10-20 mg total) by mouth every 4 (four) hours as needed. 09/05/12  Yes Lisette Abu, PA-C   promethazine (PHENERGAN) 25 MG tablet Take 25 mg by mouth every 6 (six) hours as needed. Nausea and vomiting    Yes Historical Provider, MD  doxycycline (VIBRA-TABS) 100 MG tablet Take 1 tablet (100 mg total) by mouth every 12 (twelve) hours. 09/10/12   Jari Pigg, PA-C   BP 140/83  Pulse 110  Temp(Src) 97.8 F (36.6 C) (Oral)  Resp 20  Ht 5\' 7"  (1.702 m)  Wt 145 lb (65.772 kg)  BMI 22.71 kg/m2  SpO2 100% Physical Exam CONSTITUTIONAL: Well developed/well nourished HEAD: Normocephalic/atraumatic EYES: EOMI ENMT: Mucous membranes moist NECK: supple no meningeal signs SPINE:no cervical/thoracic tenderness.  Lumbar tenderness noted. No bruising/crepitance/stepoffs noted to spine CV: S1/S2 noted, no murmurs/rubs/gallops noted LUNGS: Lungs are clear to auscultation bilaterally, no apparent distress ABDOMEN: soft, nontender, no rebound or guarding NEURO: Pt is awake/alert, moves all extremitiesx4. He can ambulate. EXTREMITIES: pulses normal, full ROM Tenderness and swelling to left elbow.  No lacerations noted.  He can range left elbow.  No left wrist or elbow tenderness.   SKIN: warm, color normal PSYCH: no abnormalities of mood noted  ED Course  Procedures 3:15 AM Pt with mechanical fall reporting low back pain and left elbow pain He reports he hit head but no vomiting and no HA.  Defer neuroimaging  Pt stable for d/c home.   Imaging Review Dg Lumbar Spine Complete  01/23/2014   CLINICAL DATA:  Fall, low back pain.  EXAM: LUMBAR SPINE - COMPLETE 4+ VIEW  COMPARISON:  None.  FINDINGS: Five non rib-bearing lumbar-type vertebral bodies are intact and aligned with maintenance of the lumbar lordosis. Intervertebral disc heights are normal. No destructive bony lesions.  Sacroiliac joints are symmetric. Included prevertebral and paraspinal soft tissue planes are non-suspicious. Mild aortic calcific atherosclerosis Status post right hip total arthroplasty, incompletely imaged. Surgical  clips in the included right abdomen likely reflect cholecystectomy. Partially imaged remote right posterior rib fractures.  IMPRESSION: No acute fracture deformity or dislocation.   Electronically Signed   By: Elon Alas   On: 01/23/2014 03:28   Dg Elbow Complete Left  01/23/2014   CLINICAL DATA:  [place] and injured left elbow.  EXAM: LEFT ELBOW - COMPLETE 3+ VIEW  COMPARISON:  None.  FINDINGS: Soft tissue swelling laterally. No evidence of acute fracture or dislocation. Well-preserved joint spaces. No intrinsic osseous abnormalities. No posterior fat pad to confirm joint effusion or hemarthrosis.  IMPRESSION: No osseous abnormality.   Electronically Signed   By: Evangeline Dakin M.D.   On: 01/23/2014 02:14    MDM   Final diagnoses:  Sprain of left elbow, initial encounter  Lumbar strain, initial encounter  Nursing notes including past medical history and social history reviewed and considered in documentation xrays reviewed and considered     Sharyon Cable, MD 01/23/14 [phone]

## 2014-01-23 NOTE — Discharge Instructions (Signed)
Back Pain, Adult Low back pain is very common. About 1 in 5 people have back pain.The cause of low back pain is rarely dangerous. The pain often gets better over time.About half of people with a sudden onset of back pain feel better in just 2 weeks. About 8 in 10 people feel better by 6 weeks.  CAUSES Some common causes of back pain include:  Strain of the muscles or ligaments supporting the spine.  Wear and tear (degeneration) of the spinal discs.  Arthritis.  Direct injury to the back. DIAGNOSIS Most of the time, the direct cause of low back pain is not known.However, back pain can be treated effectively even when the exact cause of the pain is unknown.Answering your caregiver's questions about your overall health and symptoms is one of the most accurate ways to make sure the cause of your pain is not dangerous. If your caregiver needs more information, he or she may order lab work or imaging tests (X-rays or MRIs).However, even if imaging tests show changes in your back, this usually does not require surgery. HOME CARE INSTRUCTIONS For many people, back pain returns.Since low back pain is rarely dangerous, it is often a condition that people can learn to manageon their own.   Remain active. It is stressful on the back to sit or stand in one place. Do not sit, drive, or stand in one place for more than 30 minutes at a time. Take short walks on level surfaces as soon as pain allows.Try to increase the length of time you walk each day.  Do not stay in bed.Resting more than 1 or 2 days can delay your recovery.  Do not avoid exercise or work.Your body is made to move.It is not dangerous to be active, even though your back may hurt.Your back will likely heal faster if you return to being active before your pain is gone.  Pay attention to your body when you bend and lift. Many people have less discomfortwhen lifting if they bend their knees, keep the load close to their bodies,and  avoid twisting. Often, the most comfortable positions are those that put less stress on your recovering back.  Find a comfortable position to sleep. Use a firm mattress and lie on your side with your knees slightly bent. If you lie on your back, put a pillow under your knees.  Only take over-the-counter or prescription medicines as directed by your caregiver. Over-the-counter medicines to reduce pain and inflammation are often the most helpful.Your caregiver may prescribe muscle relaxant drugs.These medicines help dull your pain so you can more quickly return to your normal activities and healthy exercise.  Put ice on the injured area.  Put ice in a plastic bag.  Place a towel between your skin and the bag.  Leave the ice on for 15-20 minutes, 03-04 times a day for the first 2 to 3 days. After that, ice and heat may be alternated to reduce pain and spasms.  Ask your caregiver about trying back exercises and gentle massage. This may be of some benefit.  Avoid feeling anxious or stressed.Stress increases muscle tension and can worsen back pain.It is important to recognize when you are anxious or stressed and learn ways to manage it.Exercise is a great option. SEEK MEDICAL CARE IF:  You have pain that is not relieved with rest or medicine.  You have pain that does not improve in 1 week.  You have new symptoms.  You are generally not feeling well. SEEK   IMMEDIATE MEDICAL CARE IF:   You have pain that radiates from your back into your legs.  You develop new bowel or bladder control problems.  You have unusual weakness or numbness in your arms or legs.  You develop nausea or vomiting.  You develop abdominal pain.  You feel faint. Document Released: 06/10/2005 Document Revised: 12/10/2011 Document Reviewed: 10/12/2013 Exit[employer] Patient Information 2015 [employer]. This information is not intended to replace advice given to you by your health care provider. Make sure you  discuss any questions you have with your health care provider.  

## 2014-01-23 NOTE — ED Notes (Signed)
Pt denies relief from pain medication.  However pt kept dozing off while talking to mother

## 2014-01-23 NOTE — ED Notes (Signed)
Patient fell earlier in the evening.  C/o lower back pain and left elbow pain.

## 2014-02-01 ENCOUNTER — Emergency Department (HOSPITAL_COMMUNITY)
Admission: EM | Admit: 2014-02-01 | Discharge: 2014-02-01 | Discharge status: Against Medical Advice | Payer: Medicaid Other | Attending: Emergency Medicine | Admitting: Emergency Medicine

## 2014-02-01 ENCOUNTER — Emergency Department (HOSPITAL_COMMUNITY): Payer: Medicaid Other

## 2014-02-01 ENCOUNTER — Encounter (HOSPITAL_COMMUNITY): Payer: Self-pay | Admitting: Emergency Medicine

## 2014-02-01 DIAGNOSIS — R296 Repeated falls: Secondary | ICD-10-CM | POA: Insufficient documentation

## 2014-02-01 DIAGNOSIS — S59919A Unspecified injury of unspecified forearm, initial encounter: Secondary | ICD-10-CM

## 2014-02-01 DIAGNOSIS — Y929 Unspecified place or not applicable: Secondary | ICD-10-CM | POA: Diagnosis not present

## 2014-02-01 DIAGNOSIS — Y9389 Activity, other specified: Secondary | ICD-10-CM | POA: Insufficient documentation

## 2014-02-01 DIAGNOSIS — IMO0002 Reserved for concepts with insufficient information to code with codable children: Secondary | ICD-10-CM | POA: Insufficient documentation

## 2014-02-01 DIAGNOSIS — S6990XA Unspecified injury of unspecified wrist, hand and finger(s), initial encounter: Secondary | ICD-10-CM

## 2014-02-01 DIAGNOSIS — S59909A Unspecified injury of unspecified elbow, initial encounter: Secondary | ICD-10-CM | POA: Insufficient documentation
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
## 2014-02-01 NOTE — ED Notes (Signed)
Pt left without informing staff of leaving, unable to have AMA form signed
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
## 2014-02-16 ENCOUNTER — Ambulatory Visit (INDEPENDENT_AMBULATORY_CARE_PROVIDER_SITE_OTHER): Payer: Medicaid Other | Admitting: Internal Medicine

## 2014-02-16 ENCOUNTER — Encounter (INDEPENDENT_AMBULATORY_CARE_PROVIDER_SITE_OTHER): Payer: Self-pay | Admitting: Internal Medicine

## 2014-02-16 ENCOUNTER — Encounter (INDEPENDENT_AMBULATORY_CARE_PROVIDER_SITE_OTHER): Payer: Self-pay | Admitting: *Deleted

## 2014-02-16 VITALS — BP 122/80 | HR 80 | Temp 97.8°F | Ht 67.0 in | Wt 150.6 lb

## 2014-02-16 DIAGNOSIS — K7030 Alcoholic cirrhosis of liver without ascites: Secondary | ICD-10-CM

## 2014-02-16 DIAGNOSIS — B182 Chronic viral hepatitis C: Secondary | ICD-10-CM

## 2014-02-16 NOTE — Patient Instructions (Signed)
OV 1 yr 

## 2014-02-16 NOTE — Progress Notes (Signed)
Subjective:    Patient ID: [name], male    DOB: 1961/06/11, 53 y.o.   MRN: [id number]  HPI Presents today for f/u.  He tells me he is doing okay.  He is having a BM daily. He denies being constipated. His appetite is for the most part good. There has been no weight loss.  No melena or BRRB. Hx of Hepatitis C.  Last viral load in 2014 was undetectable.  He has never been treated.  Last etoh 2 weeks.  Hep C quaint negative in 2009.   07/22/2012 Colonoscopy: Hx of colon adenomas:  4. History of alcoholic pancreatitis. Liver biopsy January 01, 2008 (during cholecystectomy by Dr. Arnoldo Morale) revealed chronic active hepatitis with periportal fibrosis, bile  duct proliferation graded at grade 2, stage III-IV. fails to highlight the presence of increased iron storage.       Impression:  Two small anal papillae otherwise normal colonoscopy. 06/29/2012 AFP 3.7 CBC    Component Value Date/Time   WBC 13.3* 11/11/2012 0302   RBC 4.66 11/11/2012 0302   HGB 12.9* 11/11/2012 0302   HCT 40.6 11/11/2012 0302   PLT 417* 11/11/2012 0302   MCV 87.1 11/11/2012 0302   MCH 27.7 11/11/2012 0302   MCHC 31.8 11/11/2012 0302   RDW 13.8 11/11/2012 0302   LYMPHSABS 4.8* 11/11/2012 0302   MONOABS 1.0 11/11/2012 0302   EOSABS 0.4 11/11/2012 0302   BASOSABS 0.1 11/11/2012 0302   Hepatic Function Panel     Component Value Date/Time   PROT 6.7 09/05/2012 0943   ALBUMIN 2.8* 09/05/2012 0943   AST 21 09/05/2012 0943   ALT 15 09/05/2012 0943   ALKPHOS 66 09/05/2012 0943   BILITOT 1.0 09/05/2012 0943   BILIDIR 0.2 07/09/2012 1441   IBILI 0.7 07/09/2012 1441       08/30/2012 CT abdomen/pelvis wit CM: CT ABDOMEN AND PELVIS  Findings: Unremarkable liver. Absent gallbladder. Minimal  biliary ductal prominence post cholecystectomy is nonspecific.  Unremarkable spleen, pancreas, adrenal glands.  Symmetric renal enhancement. Subcentimeter hypodensity upper pole  left kidney, incompletely characterized. No  hydronephrosis or  hydroureter.  No CT evidence for colitis. Appendix not identified. No right  lower quadrant inflammation.  Small bowel loops are normal course and caliber.  No free intraperitoneal air or fluid. No lymphadenopathy.  There is scattered atherosclerotic calcification of the aorta and  its branches. No aneurysmal dilatation.  Thin-walled bladder.  Right hip arthroplasty. Fractures of the right to L2 and L3  transverse processes.  IMPRESSION:  Fractures of the right L2 and L3 transverse processes.  Otherwise, no acute abdominopelvic process identified.    Review of Systems Past Medical History  Diagnosis Date  . COPD (chronic obstructive pulmonary disease)   . Hypothyroidism   . BPH (benign prostatic hyperplasia)   . Anxiety   . Depression   . Cirrhosis   . Pancreatitis chronic   . Epicondylitis     hips  . Hepatitis C   . Bipolar disorder   . Oxygen decrease   . Shortness of breath   . GERD (gastroesophageal reflux disease)   . Head injury   . ETOH abuse     Past Surgical History  Procedure Laterality Date  . Total hip arthroplasty  1 year ago    right hip-[place]  . Appendectomy  age 58  . Femur fracture surgery      otif  . Transurethral resection of prostate  05/23/2011    Procedure: TRANSURETHRAL RESECTION  OF THE PROSTATE (TURP);  Surgeon: Marissa Nestle;  Location: [facility];  Service: Urology;  Laterality: N/A;  placement of suprapubic catheter  . Colonoscopy  07/22/2012    Procedure: COLONOSCOPY;  Surgeon: Rogene Houston, MD;  [facility];  Service: Endoscopy;  Laterality: N/A;  225-moved to 200 Ann notified pt  . Fasciotomy Left 08/30/2012    Procedure: FASCIOTOMY;  Surgeon: Johnn Hai, MD;  Location: [facility];  Service: Orthopedics;  Laterality: Left;  . I&d extremity Left 09/03/2012    Procedure: IRRIGATION AND DEBRIDEMENT EXTREMITY;  Surgeon: Johnny Bridge, MD;  Location: [place];  Service: Orthopedics;  Laterality: Left;    . Skin split graft Left 09/08/2012    Procedure:  SPLIT THICKNESS SKIN GRAFT LEFT LEG ;  Surgeon: Rozanna Box, MD;  Location: [place];  Service: Orthopedics;  Laterality: Left;    Allergies  Allergen Reactions  . Penicillins Anaphylaxis  . Vancomycin     redman syndrome  . Codeine Hives  . Tylenol [Acetaminophen] Other (See Comments)    Patient has hepatitis    Current Outpatient Prescriptions on File Prior to Visit  Medication Sig Dispense Refill  . buPROPion (WELLBUTRIN XL) 150 MG 24 hr tablet Take 150 mg by mouth every morning.        . diazepam (VALIUM) 10 MG tablet Take 10 mg by mouth every 8 (eight) hours as needed. For anxiety       . DULoxetine (CYMBALTA) 60 MG capsule Take 60 mg by mouth 2 (two) times daily.        . fentaNYL (DURAGESIC - DOSED MCG/HR) 100 MCG/HR Place 3 patches onto the skin every other day. This dose was verified, it is correct      . levothyroxine (SYNTHROID, LEVOTHROID) 88 MCG tablet Take 88 mcg by mouth daily.      Marland Kitchen lithium carbonate 300 MG capsule Take 300 mg by mouth 3 (three) times daily with meals.        . Oxycodone HCl 10 MG TABS Take 10-20 mg by mouth every 4 (four) hours as needed (pain).      . promethazine (PHENERGAN) 25 MG tablet Take 25 mg by mouth every 6 (six) hours as needed. Nausea and vomiting        No current facility-administered medications on file prior to visit.        Objective:   Physical Exam  Filed Vitals:   02/16/14 1117  BP: 122/80  Pulse: 80  Temp: 97.8 F (36.6 C)  Height: 5\' 7"  (1.702 m)  Weight: 150 lb 9.6 oz (68.312 kg)   Alert and oriented. Skin warm and dry. Oral mucosa is moist.   . Sclera anicteric, conjunctivae is pink. Thyroid not enlarged. No cervical lymphadenopathy. Lungs clear. Heart regular rate and rhythm.  Abdomen is soft. Bowel sounds are positive. No hepatomegaly. No abdominal masses felt. No tenderness.  No edema to lower extremities.      Assessment & Plan:  Hepatitis C. Viral load  undetectable last year. CBC, Hepatic function, AFP, [place] RUQ.  Hep C quaint and Genotype. OV in 1 yr

## 2014-02-18 ENCOUNTER — Ambulatory Visit (HOSPITAL_COMMUNITY): Payer: Medicaid Other | Attending: Internal Medicine

## 2014-03-26 ENCOUNTER — Encounter (HOSPITAL_COMMUNITY): Payer: Self-pay | Admitting: Emergency Medicine

## 2014-03-26 ENCOUNTER — Emergency Department (HOSPITAL_COMMUNITY)
Admission: EM | Admit: 2014-03-26 | Discharge: 2014-03-26 | Disposition: A | Discharge status: Home / Self Care | Payer: Medicaid Other | Attending: Emergency Medicine | Admitting: Emergency Medicine

## 2014-03-26 DIAGNOSIS — Z8619 Personal history of other infectious and parasitic diseases: Secondary | ICD-10-CM | POA: Diagnosis not present

## 2014-03-26 DIAGNOSIS — Z8719 Personal history of other diseases of the digestive system: Secondary | ICD-10-CM | POA: Diagnosis not present

## 2014-03-26 DIAGNOSIS — E039 Hypothyroidism, unspecified: Secondary | ICD-10-CM | POA: Insufficient documentation

## 2014-03-26 DIAGNOSIS — S81811A Laceration without foreign body, right lower leg, initial encounter: Secondary | ICD-10-CM | POA: Diagnosis present

## 2014-03-26 DIAGNOSIS — Z720 Tobacco use: Secondary | ICD-10-CM | POA: Diagnosis not present

## 2014-03-26 DIAGNOSIS — F319 Bipolar disorder, unspecified: Secondary | ICD-10-CM | POA: Diagnosis not present

## 2014-03-26 DIAGNOSIS — F419 Anxiety disorder, unspecified: Secondary | ICD-10-CM | POA: Insufficient documentation

## 2014-03-26 DIAGNOSIS — Z880 Allergy status to penicillin: Secondary | ICD-10-CM | POA: Insufficient documentation

## 2014-03-26 DIAGNOSIS — Y9289 Other specified places as the place of occurrence of the external cause: Secondary | ICD-10-CM | POA: Insufficient documentation

## 2014-03-26 DIAGNOSIS — Z8739 Personal history of other diseases of the musculoskeletal system and connective tissue: Secondary | ICD-10-CM | POA: Insufficient documentation

## 2014-03-26 DIAGNOSIS — J441 Chronic obstructive pulmonary disease with (acute) exacerbation: Secondary | ICD-10-CM | POA: Diagnosis not present

## 2014-03-26 DIAGNOSIS — W01198A Fall on same level from slipping, tripping and stumbling with subsequent striking against other object, initial encounter: Secondary | ICD-10-CM | POA: Diagnosis not present

## 2014-03-26 DIAGNOSIS — Z79899 Other long term (current) drug therapy: Secondary | ICD-10-CM | POA: Insufficient documentation

## 2014-03-26 DIAGNOSIS — Y9389 Activity, other specified: Secondary | ICD-10-CM | POA: Insufficient documentation

## 2014-03-26 DIAGNOSIS — Z87448 Personal history of other diseases of urinary system: Secondary | ICD-10-CM | POA: Diagnosis not present

## 2014-03-26 MED ORDER — OXYCODONE HCL 5 MG PO TABS: 5.0000 mg | ORAL_TABLET | ORAL | Status: DC | PRN

## 2014-03-26 MED ORDER — LIDOCAINE HCL (PF) 1 % IJ SOLN
INTRAMUSCULAR | Status: AC
  Filled 2014-03-26: qty 5

## 2014-03-26 MED ORDER — LIDOCAINE HCL (PF) 1 % IJ SOLN
INTRAMUSCULAR | Status: AC
  Administered 2014-03-26: 02:00:00
  Filled 2014-03-26: qty 5

## 2014-03-26 MED ORDER — DOXYCYCLINE HYCLATE 100 MG PO CAPS: 100.0000 mg | ORAL_CAPSULE | Freq: Two times a day (BID) | ORAL | Status: DC

## 2014-03-26 NOTE — ED Notes (Signed)
Pt reporting fall during which he cut his leg.  Laceration noted to right lower leg.  Bleeding controlled at this time.

## 2014-03-26 NOTE — ED Notes (Signed)
Pt fell and hit brick chimney with the rt leg.

## 2014-03-26 NOTE — Discharge Instructions (Signed)
Local wound care with dressing changes and bacitracin twice daily.  Doxycycline as prescribed.  Oxycodone as prescribed as needed for pain.  Staples are to be removed in 2 weeks. Return to the ER or see your primary Dr. for this. Return sooner if you develop redness, pus draining from the wound, streaking up or down the leg, or other new and concerning symptoms.   Laceration Care, Adult A laceration is a cut or lesion that goes through all layers of the skin and into the tissue just beneath the skin. TREATMENT  Some lacerations may not require closure. Some lacerations may not be able to be closed due to an increased risk of infection. It is important to see your caregiver as soon as possible after an injury to minimize the risk of infection and maximize the opportunity for successful closure. If closure is appropriate, pain medicines may be given, if needed. The wound will be cleaned to help prevent infection. Your caregiver will use stitches (sutures), staples, wound glue (adhesive), or skin adhesive strips to repair the laceration. These tools bring the skin edges together to allow for faster healing and a better cosmetic outcome. However, all wounds will heal with a scar. Once the wound has healed, scarring can be minimized by covering the wound with sunscreen during the day for 1 full year. HOME CARE INSTRUCTIONS  For sutures or staples:  Keep the wound clean and dry.  If you were given a bandage (dressing), you should change it at least once a day. Also, change the dressing if it becomes wet or dirty, or as directed by your caregiver.  Wash the wound with soap and water 2 times a day. Rinse the wound off with water to remove all soap. Pat the wound dry with a clean towel.  After cleaning, apply a thin layer of the antibiotic ointment as recommended by your caregiver. This will help prevent infection and keep the dressing from sticking.  You may shower as usual after the first 24 hours.  Do not soak the wound in water until the sutures are removed.  Only take over-the-counter or prescription medicines for pain, discomfort, or fever as directed by your caregiver.  Get your sutures or staples removed as directed by your caregiver. For skin adhesive strips:  Keep the wound clean and dry.  Do not get the skin adhesive strips wet. You may bathe carefully, using caution to keep the wound dry.  If the wound gets wet, pat it dry with a clean towel.  Skin adhesive strips will fall off on their own. You may trim the strips as the wound heals. Do not remove skin adhesive strips that are still stuck to the wound. They will fall off in time. For wound adhesive:  You may briefly wet your wound in the shower or bath. Do not soak or scrub the wound. Do not swim. Avoid periods of heavy perspiration until the skin adhesive has fallen off on its own. After showering or bathing, gently pat the wound dry with a clean towel.  Do not apply liquid medicine, cream medicine, or ointment medicine to your wound while the skin adhesive is in place. This may loosen the film before your wound is healed.  If a dressing is placed over the wound, be careful not to apply tape directly over the skin adhesive. This may cause the adhesive to be pulled off before the wound is healed.  Avoid prolonged exposure to sunlight or tanning lamps while the skin adhesive  is in place. Exposure to ultraviolet light in the first year will darken the scar.  The skin adhesive will usually remain in place for 5 to 10 days, then naturally fall off the skin. Do not pick at the adhesive film. You may need a tetanus shot if:  You cannot remember when you had your last tetanus shot.  You have never had a tetanus shot. If you get a tetanus shot, your arm may swell, get red, and feel warm to the touch. This is common and not a problem. If you need a tetanus shot and you choose not to have one, there is a rare chance of getting  tetanus. Sickness from tetanus can be serious. SEEK MEDICAL CARE IF:   You have redness, swelling, or increasing pain in the wound.  You see a red line that goes away from the wound.  You have yellowish-white fluid (pus) coming from the wound.  You have a fever.  You notice a bad smell coming from the wound or dressing.  Your wound breaks open before or after sutures have been removed.  You notice something coming out of the wound such as wood or glass.  Your wound is on your hand or foot and you cannot move a finger or toe. SEEK IMMEDIATE MEDICAL CARE IF:   Your pain is not controlled with prescribed medicine.  You have severe swelling around the wound causing pain and numbness or a change in color in your arm, hand, leg, or foot.  Your wound splits open and starts bleeding.  You have worsening numbness, weakness, or loss of function of any joint around or beyond the wound.  You develop painful lumps near the wound or on the skin anywhere on your body. MAKE SURE YOU:   Understand these instructions.  Will watch your condition.  Will get help right away if you are not doing well or get worse. Document Released: 06/10/2005 Document Revised: 09/02/2011 Document Reviewed: 12/04/2010 [facility] Patient Information 2015 [place], [place]. This information is not intended to replace advice given to you by your health care provider. Make sure you discuss any questions you have with your health care provider.

## 2014-03-26 NOTE — ED Provider Notes (Signed)
CSN: [id number]     Arrival date & time 03/26/14  0119 History   First MD Initiated Contact with Patient 03/26/14 0143     Chief Complaint  Patient presents with  . Fall     (Consider location/radiation/quality/duration/timing/severity/associated sxs/prior Treatment) HPI Comments: Patient is a 53 year old male who was getting up from a chair and struck his leg on the brick fireplace. He sustained a large laceration to the right lower leg.  Patient is a 53 y.o. male presenting with fall. The history is provided by the patient.  Fall This is a new problem. The current episode started less than 1 hour ago. The problem occurs constantly. The problem has not changed since onset.Nothing aggravates the symptoms. Nothing relieves the symptoms.    Past Medical History  Diagnosis Date  . COPD (chronic obstructive pulmonary disease)   . Hypothyroidism   . BPH (benign prostatic hyperplasia)   . Anxiety   . Depression   . Cirrhosis   . Pancreatitis chronic   . Epicondylitis     hips  . Hepatitis C   . Bipolar disorder   . Oxygen decrease   . Shortness of breath   . GERD (gastroesophageal reflux disease)   . Head injury   . ETOH abuse    Past Surgical History  Procedure Laterality Date  . Total hip arthroplasty  1 year ago    right hip-[place]  . Appendectomy  age 51  . Femur fracture surgery      otif  . Transurethral resection of prostate  05/23/2011    Procedure: TRANSURETHRAL RESECTION OF THE PROSTATE (TURP);  Surgeon: Marissa Nestle;  [facility]: [facility];  Service: Urology;  Laterality: N/A;  placement of suprapubic catheter  . Colonoscopy  07/22/2012    Procedure: COLONOSCOPY;  Surgeon: Rogene Houston, MD;  [facility];  Service: Endoscopy;  Laterality: N/A;  225-moved to 200 Ann notified pt  . Fasciotomy Left 08/30/2012    Procedure: FASCIOTOMY;  Surgeon: Johnn Hai, MD;  [facility];  Service: Orthopedics;  Laterality: Left;  . I&d extremity Left  09/03/2012    Procedure: IRRIGATION AND DEBRIDEMENT EXTREMITY;  Surgeon: Johnny Bridge, MD;  Location: [place];  Service: Orthopedics;  Laterality: Left;  . Skin split graft Left 09/08/2012    Procedure:  SPLIT THICKNESS SKIN GRAFT LEFT LEG ;  Surgeon: Rozanna Box, MD;  Location: [place];  Service: Orthopedics;  Laterality: Left;   Family History  Problem Relation Age of Onset  . Anesthesia problems Neg Hx   . Hypotension Neg Hx   . Pseudochol deficiency Neg Hx   . Malignant hyperthermia Neg Hx    History  Substance Use Topics  . Smoking status: Current Every Day Smoker -- 1.00 packs/day for 30 years    Types: Cigarettes  . Smokeless tobacco: Not on file  . Alcohol Use: Yes     Comment: quit drinking this month. Had been drinking "alot"    Review of Systems  All other systems reviewed and are negative.     Allergies  Penicillins; Vancomycin; Codeine; and Tylenol  Home Medications   Prior to Admission medications   Medication Sig Start Date End Date Taking? Authorizing Provider  buPROPion (WELLBUTRIN XL) 150 MG 24 hr tablet Take 150 mg by mouth every morning.     Yes Historical Provider, MD  diazepam (VALIUM) 10 MG tablet Take 10 mg by mouth every 8 (eight) hours as needed. For anxiety  Yes Historical Provider, MD  DULoxetine (CYMBALTA) 60 MG capsule Take 60 mg by mouth 2 (two) times daily.     Yes Historical Provider, MD  fentaNYL (DURAGESIC - DOSED MCG/HR) 100 MCG/HR Place 3 patches onto the skin every other day. This dose was verified, it is correct   Yes Historical Provider, MD  levothyroxine (SYNTHROID, LEVOTHROID) 88 MCG tablet Take 88 mcg by mouth daily.   Yes Historical Provider, MD  lithium carbonate 300 MG capsule Take 300 mg by mouth 3 (three) times daily with meals.     Yes Historical Provider, MD  Oxycodone HCl 10 MG TABS Take 10-20 mg by mouth every 4 (four) hours as needed (pain).   Yes Historical Provider, MD  promethazine (PHENERGAN) 25 MG tablet Take 25 mg  by mouth every 6 (six) hours as needed. Nausea and vomiting    Yes Historical Provider, MD  doxycycline (VIBRAMYCIN) 100 MG capsule Take 1 capsule (100 mg total) by mouth 2 (two) times daily. 03/26/14   Veryl Speak, MD  oxyCODONE (OXY IR/ROXICODONE) 5 MG immediate release tablet Take 1 tablet (5 mg total) by mouth every 4 (four) hours as needed for severe pain. 03/26/14   Veryl Speak, MD   There were no vitals taken for this visit. Physical Exam  Nursing note and vitals reviewed. Constitutional: He is oriented to person, place, and time. He appears well-developed and well-nourished. No distress.  HENT:  Head: Normocephalic and atraumatic.  Neck: Normal range of motion. Neck supple.  Musculoskeletal: Normal range of motion.  There is a 10 cm laceration to the right lower leg to the anterior/lateral aspect. Distal pulses are palpable. Motor and sensory are intact.  Neurological: He is alert and oriented to person, place, and time.  Skin: Skin is warm and dry. He is not diaphoretic.    ED Course  Procedures (including critical care time) Labs Review Labs Reviewed - No data to display  Imaging Review No results found.   EKG Interpretation None     LACERATION REPAIR Performed by: Veryl Speak Authorized by: Veryl Speak Consent: Verbal consent obtained. Risks and benefits: risks, benefits and alternatives were discussed Consent given by: patient Patient identity confirmed: provided demographic data Prepped and Draped in normal sterile fashion Wound explored  Laceration Location: Right lower leg  Laceration Length: 10 cm  No Foreign Bodies seen or palpated  Anesthesia: local infiltration  Local anesthetic: lidocaine 1 % without epinephrine  Anesthetic total: 8 ml  Irrigation method: syringe Amount of cleaning: standard  Skin closure: Staples   Number of sutures: 14   Technique: Staples   Patient tolerance: Patient tolerated the procedure well with no immediate  complications.   MDM   Final diagnoses:  Leg laceration, right, initial encounter    Patient with a long laceration to the right lower leg. His skin is somewhat brittle, however approximated well with the staples. He will be placed in a dressing and advised to perform dressing changes at home. I will also prescribe doxycycline as he has a severe penicillin allergy. He is to have the staples removed in 2 weeks, return for signs or symptoms of infection.    Veryl Speak, MD 03/26/14 [phone]

## 2014-03-31 ENCOUNTER — Emergency Department (HOSPITAL_COMMUNITY)
Admission: EM | Admit: 2014-03-31 | Discharge: 2014-03-31 | Disposition: A | Discharge status: Home / Self Care | Payer: Medicaid Other | Attending: Emergency Medicine | Admitting: Emergency Medicine

## 2014-03-31 ENCOUNTER — Encounter (HOSPITAL_COMMUNITY): Payer: Self-pay | Admitting: Emergency Medicine

## 2014-03-31 DIAGNOSIS — Z87448 Personal history of other diseases of urinary system: Secondary | ICD-10-CM | POA: Diagnosis not present

## 2014-03-31 DIAGNOSIS — Z8739 Personal history of other diseases of the musculoskeletal system and connective tissue: Secondary | ICD-10-CM | POA: Diagnosis not present

## 2014-03-31 DIAGNOSIS — Z4801 Encounter for change or removal of surgical wound dressing: Secondary | ICD-10-CM | POA: Insufficient documentation

## 2014-03-31 DIAGNOSIS — Z87828 Personal history of other (healed) physical injury and trauma: Secondary | ICD-10-CM | POA: Diagnosis not present

## 2014-03-31 DIAGNOSIS — Z880 Allergy status to penicillin: Secondary | ICD-10-CM | POA: Insufficient documentation

## 2014-03-31 DIAGNOSIS — E039 Hypothyroidism, unspecified: Secondary | ICD-10-CM | POA: Insufficient documentation

## 2014-03-31 DIAGNOSIS — Z8619 Personal history of other infectious and parasitic diseases: Secondary | ICD-10-CM | POA: Insufficient documentation

## 2014-03-31 DIAGNOSIS — J449 Chronic obstructive pulmonary disease, unspecified: Secondary | ICD-10-CM | POA: Diagnosis not present

## 2014-03-31 DIAGNOSIS — Z79899 Other long term (current) drug therapy: Secondary | ICD-10-CM | POA: Insufficient documentation

## 2014-03-31 DIAGNOSIS — Z792 Long term (current) use of antibiotics: Secondary | ICD-10-CM | POA: Insufficient documentation

## 2014-03-31 DIAGNOSIS — F419 Anxiety disorder, unspecified: Secondary | ICD-10-CM | POA: Insufficient documentation

## 2014-03-31 DIAGNOSIS — Z720 Tobacco use: Secondary | ICD-10-CM | POA: Diagnosis not present

## 2014-03-31 DIAGNOSIS — Z8719 Personal history of other diseases of the digestive system: Secondary | ICD-10-CM | POA: Diagnosis not present

## 2014-03-31 DIAGNOSIS — Z5189 Encounter for other specified aftercare: Secondary | ICD-10-CM

## 2014-03-31 NOTE — ED Notes (Addendum)
Seen here 10/3  Says he was told to return for recheck of his rt leg

## 2014-03-31 NOTE — ED Notes (Addendum)
Wound rewrapped. CMS intact prior to and post wrapping.

## 2014-03-31 NOTE — ED Provider Notes (Signed)
CSN: [id number]     Arrival date & time 03/31/14  1556 History   First MD Initiated Contact with Patient 03/31/14 1612     No chief complaint on file.    (Consider location/radiation/quality/duration/timing/severity/associated sxs/prior Treatment) HPI 53 y.o. male here for recheck after having staples placed. Patient states someone called the house today to ask him how he was doing and ask if he had any problems. His family member spoke with the person and voiced concern that she thought the area may be infected and was told to return if she had concerns. Patient states the area hurts some but no other problem.    Past Medical History  Diagnosis Date  . COPD (chronic obstructive pulmonary disease)   . Hypothyroidism   . BPH (benign prostatic hyperplasia)   . Anxiety   . Depression   . Cirrhosis   . Pancreatitis chronic   . Epicondylitis     hips  . Hepatitis C   . Bipolar disorder   . Oxygen decrease   . Shortness of breath   . GERD (gastroesophageal reflux disease)   . Head injury   . ETOH abuse    Past Surgical History  Procedure Laterality Date  . Total hip arthroplasty  1 year ago    right hip-[place]  . Appendectomy  age 11  . Femur fracture surgery      otif  . Transurethral resection of prostate  05/23/2011    Procedure: TRANSURETHRAL RESECTION OF THE PROSTATE (TURP);  Surgeon: Marissa Nestle;  [facility];  Service: Urology;  Laterality: N/A;  placement of suprapubic catheter  . Colonoscopy  07/22/2012    Procedure: COLONOSCOPY;  Surgeon: Rogene Houston, MD;  [facility];  Service: Endoscopy;  Laterality: N/A;  225-moved to 200 Ann notified pt  . Fasciotomy Left 08/30/2012    Procedure: FASCIOTOMY;  Surgeon: Johnn Hai, MD;  [facility];  Service: Orthopedics;  Laterality: Left;  . I&d extremity Left 09/03/2012    Procedure: IRRIGATION AND DEBRIDEMENT EXTREMITY;  Surgeon: Johnny Bridge, MD;  Location: [place];  Service: Orthopedics;   Laterality: Left;  . Skin split graft Left 09/08/2012    Procedure:  SPLIT THICKNESS SKIN GRAFT LEFT LEG ;  Surgeon: Rozanna Box, MD;  Location: [place];  Service: Orthopedics;  Laterality: Left;   Family History  Problem Relation Age of Onset  . Anesthesia problems Neg Hx   . Hypotension Neg Hx   . Pseudochol deficiency Neg Hx   . Malignant hyperthermia Neg Hx    History  Substance Use Topics  . Smoking status: Current Every Day Smoker -- 1.00 packs/day for 30 years    Types: Cigarettes  . Smokeless tobacco: Not on file  . Alcohol Use: Yes     Comment: quit drinking this month. Had been drinking "alot"    Review of Systems Negative except as stated in HPI   Allergies  Penicillins; Vancomycin; Codeine; and Tylenol  Home Medications   Prior to Admission medications   Medication Sig Start Date End Date Taking? Authorizing Provider  buPROPion (WELLBUTRIN XL) 150 MG 24 hr tablet Take 150 mg by mouth every morning.      Historical Provider, MD  diazepam (VALIUM) 10 MG tablet Take 10 mg by mouth every 8 (eight) hours as needed. For anxiety     Historical Provider, MD  doxycycline (VIBRAMYCIN) 100 MG capsule Take 1 capsule (100 mg total) by mouth 2 (two)  times daily. 03/26/14   Veryl Speak, MD  DULoxetine (CYMBALTA) 60 MG capsule Take 60 mg by mouth 2 (two) times daily.      Historical Provider, MD  fentaNYL (DURAGESIC - DOSED MCG/HR) 100 MCG/HR Place 3 patches onto the skin every other day. This dose was verified, it is correct    Historical Provider, MD  levothyroxine (SYNTHROID, LEVOTHROID) 88 MCG tablet Take 88 mcg by mouth daily.    Historical Provider, MD  lithium carbonate 300 MG capsule Take 300 mg by mouth 3 (three) times daily with meals.      Historical Provider, MD  oxyCODONE (OXY IR/ROXICODONE) 5 MG immediate release tablet Take 1 tablet (5 mg total) by mouth every 4 (four) hours as needed for severe pain. 03/26/14   Veryl Speak, MD  Oxycodone HCl 10 MG TABS Take 10-20  mg by mouth every 4 (four) hours as needed (pain).    Historical Provider, MD  promethazine (PHENERGAN) 25 MG tablet Take 25 mg by mouth every 6 (six) hours as needed. Nausea and vomiting     Historical Provider, MD   BP 139/90  Pulse 114  Temp(Src) 98 F (36.7 C) (Oral)  Resp 18  Ht 5\' 7"  (1.702 m)  Wt 160 lb (72.576 kg)  BMI 25.05 kg/m2  SpO2 97% Physical Exam  Nursing note and vitals reviewed. Constitutional: He is oriented to person, place, and time. He appears well-developed and well-nourished.  HENT:  Head: Normocephalic.  Eyes: Conjunctivae and EOM are normal.  Neck: Neck supple.  Cardiovascular: Normal rate.   Pulmonary/Chest: Effort normal.  Musculoskeletal:       Right lower leg: He exhibits tenderness and laceration. He exhibits no swelling.       Legs: 10 cm laceration to the right lower leg healing without signs of infection. Pedal pulse strong, adequate circulation. Staples in place, there is no red streaking, no drainage from the wound, no increased warmth of the area.   Neurological: He is alert and oriented to person, place, and time. No cranial nerve deficit.  Skin: Skin is warm and dry.  Psychiatric: He has a normal mood and affect. His behavior is normal.    ED Course  Procedures (including critical care time) Labs Review  MDM  53 y.o. male with healing wound to the right lower leg without signs of infection at this time. Discussed in detail with the patient and his family member signs of infection to watch for and wound care. Discussed with the patient importance of trying to stop smoking to help promote the healing process. He will plan to return for staple removal 2 weeks from the time of his initial visit. He will return sooner for any problems. He will continue his antibiotics.    [place], [place] 04/01/14 [phone]

## 2014-04-02 NOTE — ED Provider Notes (Signed)
Medical screening examination/treatment/procedure(s) were performed by non-physician practitioner and as supervising physician I was immediately available for consultation/collaboration.   EKG Interpretation None        Maudry Diego, MD 04/02/14 [phone]

## 2014-04-09 ENCOUNTER — Emergency Department (HOSPITAL_COMMUNITY)
Admission: EM | Admit: 2014-04-09 | Discharge: 2014-04-09 | Disposition: A | Discharge status: Home / Self Care | Payer: Medicaid Other | Attending: Emergency Medicine | Admitting: Emergency Medicine

## 2014-04-09 ENCOUNTER — Encounter (HOSPITAL_COMMUNITY): Payer: Self-pay | Admitting: Emergency Medicine

## 2014-04-09 DIAGNOSIS — Z880 Allergy status to penicillin: Secondary | ICD-10-CM | POA: Diagnosis not present

## 2014-04-09 DIAGNOSIS — F419 Anxiety disorder, unspecified: Secondary | ICD-10-CM | POA: Insufficient documentation

## 2014-04-09 DIAGNOSIS — R000 Tachycardia, unspecified: Secondary | ICD-10-CM | POA: Diagnosis not present

## 2014-04-09 DIAGNOSIS — Z87828 Personal history of other (healed) physical injury and trauma: Secondary | ICD-10-CM | POA: Diagnosis not present

## 2014-04-09 DIAGNOSIS — Z8739 Personal history of other diseases of the musculoskeletal system and connective tissue: Secondary | ICD-10-CM | POA: Diagnosis not present

## 2014-04-09 DIAGNOSIS — Z720 Tobacco use: Secondary | ICD-10-CM | POA: Diagnosis not present

## 2014-04-09 DIAGNOSIS — Z8619 Personal history of other infectious and parasitic diseases: Secondary | ICD-10-CM | POA: Diagnosis not present

## 2014-04-09 DIAGNOSIS — E039 Hypothyroidism, unspecified: Secondary | ICD-10-CM | POA: Diagnosis not present

## 2014-04-09 DIAGNOSIS — Z8719 Personal history of other diseases of the digestive system: Secondary | ICD-10-CM | POA: Diagnosis not present

## 2014-04-09 DIAGNOSIS — Z4802 Encounter for removal of sutures: Secondary | ICD-10-CM | POA: Insufficient documentation

## 2014-04-09 DIAGNOSIS — F319 Bipolar disorder, unspecified: Secondary | ICD-10-CM | POA: Insufficient documentation

## 2014-04-09 DIAGNOSIS — Z87448 Personal history of other diseases of urinary system: Secondary | ICD-10-CM | POA: Diagnosis not present

## 2014-04-09 DIAGNOSIS — J449 Chronic obstructive pulmonary disease, unspecified: Secondary | ICD-10-CM | POA: Insufficient documentation

## 2014-04-09 MED ORDER — OXYCODONE HCL 5 MG PO TABS
5.0000 mg | ORAL_TABLET | Freq: Four times a day (QID) | ORAL | Status: DC | PRN
Start: 2014-04-09 — End: 2014-11-12

## 2014-04-09 NOTE — ED Provider Notes (Signed)
Medical screening examination/treatment/procedure(s) were performed by non-physician practitioner and as supervising physician I was immediately available for consultation/collaboration.  Orpah Greek, MD 04/09/14 [phone]

## 2014-04-09 NOTE — ED Notes (Signed)
Pt has staples to right lower leg x 10 days need removal

## 2014-04-09 NOTE — ED Provider Notes (Signed)
CSN: [id number]     Arrival date & time 04/09/14  1858 History   First MD Initiated Contact with Patient 04/09/14 1900     Chief Complaint  Patient presents with  . Suture / Staple Removal     (Consider location/radiation/quality/duration/timing/severity/associated sxs/prior Treatment) Patient is a 53 y.o. male presenting with suture removal. The history is provided by the patient.  Suture / Staple Removal The current episode started 1 to 4 weeks ago.   [name] is a 53 y.o. male who presents to the ED for staple removal. The staples were placed to the right lower leg 10 days ago. He denies any problems associated with the staples. He does have chronic pain.   Past Medical History  Diagnosis Date  . COPD (chronic obstructive pulmonary disease)   . Hypothyroidism   . BPH (benign prostatic hyperplasia)   . Anxiety   . Depression   . Cirrhosis   . Pancreatitis chronic   . Epicondylitis     hips  . Hepatitis C   . Bipolar disorder   . Oxygen decrease   . Shortness of breath   . GERD (gastroesophageal reflux disease)   . Head injury   . ETOH abuse    Past Surgical History  Procedure Laterality Date  . Total hip arthroplasty  1 year ago    right hip-[place]  . Appendectomy  age 25  . Femur fracture surgery      otif  . Transurethral resection of prostate  05/23/2011    Procedure: TRANSURETHRAL RESECTION OF THE PROSTATE (TURP);  Surgeon: Marissa Nestle;  Location: [facility];  Service: Urology;  Laterality: N/A;  placement of suprapubic catheter  . Colonoscopy  07/22/2012    Procedure: COLONOSCOPY;  Surgeon: Rogene Houston, MD;  Location: [facility];  Service: Endoscopy;  Laterality: N/A;  225-moved to 200 Ann notified pt  . Fasciotomy Left 08/30/2012    Procedure: FASCIOTOMY;  Surgeon: Johnn Hai, MD;  Location: [facility];  Service: Orthopedics;  Laterality: Left;  . I&d extremity Left 09/03/2012    Procedure: IRRIGATION AND DEBRIDEMENT EXTREMITY;  Surgeon:  Johnny Bridge, MD;  Location: [place];  Service: Orthopedics;  Laterality: Left;  . Skin split graft Left 09/08/2012    Procedure:  SPLIT THICKNESS SKIN GRAFT LEFT LEG ;  Surgeon: Rozanna Box, MD;  Location: [place];  Service: Orthopedics;  Laterality: Left;   Family History  Problem Relation Age of Onset  . Anesthesia problems Neg Hx   . Hypotension Neg Hx   . Pseudochol deficiency Neg Hx   . Malignant hyperthermia Neg Hx    History  Substance Use Topics  . Smoking status: Current Every Day Smoker -- 1.00 packs/day for 30 years    Types: Cigarettes  . Smokeless tobacco: Not on file  . Alcohol Use: Yes     Comment: quit drinking this month. Had been drinking "alot"    Review of Systems Negative except as stated in HPI   Allergies  Penicillins; Vancomycin; Codeine; and Tylenol  Home Medications   Prior to Admission medications   Medication Sig Start Date End Date Taking? Authorizing Provider  buPROPion (WELLBUTRIN XL) 150 MG 24 hr tablet Take 150 mg by mouth every morning.      Historical Provider, MD  diazepam (VALIUM) 10 MG tablet Take 10 mg by mouth every 8 (eight) hours as needed. For anxiety     Historical Provider, MD  doxycycline (VIBRAMYCIN) 100  MG capsule Take 1 capsule (100 mg total) by mouth 2 (two) times daily. 03/26/14   Veryl Speak, MD  DULoxetine (CYMBALTA) 60 MG capsule Take 60 mg by mouth 2 (two) times daily.      Historical Provider, MD  fentaNYL (DURAGESIC - DOSED MCG/HR) 100 MCG/HR Place 3 patches onto the skin every other day. This dose was verified, it is correct    Historical Provider, MD  levothyroxine (SYNTHROID, LEVOTHROID) 88 MCG tablet Take 88 mcg by mouth daily.    Historical Provider, MD  lithium carbonate 300 MG capsule Take 300 mg by mouth 3 (three) times daily with meals.      Historical Provider, MD  oxyCODONE (OXY IR/ROXICODONE) 5 MG immediate release tablet Take 1 tablet (5 mg total) by mouth every 4 (four) hours as needed for severe pain.  03/26/14   Veryl Speak, MD  Oxycodone HCl 10 MG TABS Take 10-20 mg by mouth every 4 (four) hours as needed (pain).    Historical Provider, MD  promethazine (PHENERGAN) 25 MG tablet Take 25 mg by mouth every 6 (six) hours as needed. Nausea and vomiting     Historical Provider, MD   BP 154/92  Pulse 107  Temp(Src) 98.4 F (36.9 C) (Oral)  Resp 18  Ht 5\' 7"  (1.702 m)  Wt 160 lb (72.576 kg)  BMI 25.05 kg/m2  SpO2 98% Physical Exam  Nursing note and vitals reviewed. Constitutional: He is oriented to person, place, and time. He appears well-developed and well-nourished. No distress.  HENT:  Head: Normocephalic.  Eyes: EOM are normal.  Neck: Neck supple.  Cardiovascular: Tachycardia present.   Pulmonary/Chest: Effort normal.  Musculoskeletal:       Right lower leg: He exhibits laceration. He exhibits no swelling.       Legs: Healing laceration to the right lower leg. No red streaking, no drainage or other signs of infection.   Neurological: He is alert and oriented to person, place, and time. No cranial nerve deficit.  Skin: Skin is warm and dry.  Psychiatric: He has a normal mood and affect. His behavior is normal.    ED Course  Procedures (including critical care time) Labs Review Staples removed by me without difficulty.  Wound cleaned and dressing applied.  MDM  53 y.o. male here for staple removal. He will follow up with his PCP or return here as needed for problems.     Ashley Murrain, [place] 04/09/14 [phone]

## 2014-07-07 ENCOUNTER — Encounter (HOSPITAL_COMMUNITY): Payer: Self-pay | Admitting: Specialist

## 2014-11-08 ENCOUNTER — Encounter (INDEPENDENT_AMBULATORY_CARE_PROVIDER_SITE_OTHER): Payer: Self-pay | Admitting: *Deleted

## 2014-11-10 ENCOUNTER — Emergency Department (HOSPITAL_COMMUNITY): Payer: MEDICAID

## 2014-11-10 ENCOUNTER — Inpatient Hospital Stay (HOSPITAL_COMMUNITY)
Admission: EM | Admit: 2014-11-10 | Discharge: 2014-11-12 | DRG: 896 | Disposition: A | Discharge status: Home / Self Care | Payer: MEDICAID | Attending: Pulmonary Disease | Admitting: Pulmonary Disease

## 2014-11-10 ENCOUNTER — Encounter (HOSPITAL_COMMUNITY): Payer: Self-pay | Admitting: Cardiology

## 2014-11-10 DIAGNOSIS — J449 Chronic obstructive pulmonary disease, unspecified: Secondary | ICD-10-CM | POA: Diagnosis present

## 2014-11-10 DIAGNOSIS — E039 Hypothyroidism, unspecified: Secondary | ICD-10-CM | POA: Diagnosis present

## 2014-11-10 DIAGNOSIS — K7460 Unspecified cirrhosis of liver: Secondary | ICD-10-CM | POA: Diagnosis present

## 2014-11-10 DIAGNOSIS — K861 Other chronic pancreatitis: Secondary | ICD-10-CM | POA: Diagnosis present

## 2014-11-10 DIAGNOSIS — Z96641 Presence of right artificial hip joint: Secondary | ICD-10-CM | POA: Diagnosis present

## 2014-11-10 DIAGNOSIS — F10231 Alcohol dependence with withdrawal delirium: Secondary | ICD-10-CM

## 2014-11-10 DIAGNOSIS — J438 Other emphysema: Secondary | ICD-10-CM | POA: Diagnosis not present

## 2014-11-10 DIAGNOSIS — F1123 Opioid dependence with withdrawal: Principal | ICD-10-CM | POA: Diagnosis present

## 2014-11-10 DIAGNOSIS — F1010 Alcohol abuse, uncomplicated: Secondary | ICD-10-CM

## 2014-11-10 DIAGNOSIS — G9340 Encephalopathy, unspecified: Secondary | ICD-10-CM | POA: Diagnosis present

## 2014-11-10 DIAGNOSIS — F319 Bipolar disorder, unspecified: Secondary | ICD-10-CM | POA: Diagnosis present

## 2014-11-10 DIAGNOSIS — F17210 Nicotine dependence, cigarettes, uncomplicated: Secondary | ICD-10-CM | POA: Diagnosis present

## 2014-11-10 DIAGNOSIS — R404 Transient alteration of awareness: Secondary | ICD-10-CM

## 2014-11-10 DIAGNOSIS — G8929 Other chronic pain: Secondary | ICD-10-CM | POA: Diagnosis present

## 2014-11-10 DIAGNOSIS — R4182 Altered mental status, unspecified: Secondary | ICD-10-CM | POA: Diagnosis not present

## 2014-11-10 DIAGNOSIS — K219 Gastro-esophageal reflux disease without esophagitis: Secondary | ICD-10-CM | POA: Diagnosis present

## 2014-11-10 DIAGNOSIS — B1920 Unspecified viral hepatitis C without hepatic coma: Secondary | ICD-10-CM | POA: Diagnosis present

## 2014-11-10 DIAGNOSIS — F1193 Opioid use, unspecified with withdrawal: Secondary | ICD-10-CM | POA: Diagnosis present

## 2014-11-10 DIAGNOSIS — F11288 Opioid dependence with other opioid-induced disorder: Secondary | ICD-10-CM

## 2014-11-10 DIAGNOSIS — F10931 Alcohol use, unspecified with withdrawal delirium: Secondary | ICD-10-CM

## 2014-11-10 DIAGNOSIS — F1011 Alcohol abuse, in remission: Secondary | ICD-10-CM | POA: Diagnosis present

## 2014-11-10 LAB — COMPREHENSIVE METABOLIC PANEL
ALBUMIN: 3.6 g/dL (ref 3.5–5.0)
ALT: 39 U/L (ref 17–63)
AST: 41 U/L (ref 15–41)
Alkaline Phosphatase: 78 U/L (ref 38–126)
Anion gap: 9 (ref 5–15)
BUN: 8 mg/dL (ref 6–20)
CALCIUM: 9.5 mg/dL (ref 8.9–10.3)
CO2: 28 mmol/L (ref 22–32)
CREATININE: 0.84 mg/dL (ref 0.61–1.24)
Chloride: 104 mmol/L (ref 101–111)
GFR calc Af Amer: >60 mL/min (ref >60)
GFR calc non Af Amer: >60 mL/min (ref >60)
Glucose, Bld: 115 mg/dL — ABNORMAL HIGH (ref 65–99)
Potassium: 3.7 mmol/L (ref 3.5–5.1)
Sodium: 141 mmol/L (ref 135–145)
TOTAL PROTEIN: 6.9 g/dL (ref 6.5–8.1)
Total Bilirubin: 0.5 mg/dL (ref 0.3–1.2)

## 2014-11-10 LAB — I-STAT CHEM 8, ED
BUN: 5 mg/dL — ABNORMAL LOW (ref 6–20)
CHLORIDE: 101 mmol/L (ref 101–111)
Calcium, Ion: 1.25 mmol/L — ABNORMAL HIGH (ref 1.12–1.23)
Creatinine, Ser: 0.8 mg/dL (ref 0.61–1.24)
Glucose, Bld: 114 mg/dL — ABNORMAL HIGH (ref 65–99)
HCT: 46 % (ref 39.0–52.0)
Hemoglobin: 15.6 g/dL (ref 13.0–17.0)
POTASSIUM: 3.6 mmol/L (ref 3.5–5.1)
Sodium: 141 mmol/L (ref 135–145)
TCO2: 26 mmol/L (ref 0–100)

## 2014-11-10 LAB — CBC WITH DIFFERENTIAL/PLATELET
BASOS ABS: 0.1 10*3/uL (ref 0.0–0.1)
Basophils Relative: 1 % (ref 0–1)
Eosinophils Absolute: 0.1 10*3/uL (ref 0.0–0.7)
Eosinophils Relative: 1 % (ref 0–5)
HCT: 42.8 % (ref 39.0–52.0)
Hemoglobin: 14.1 g/dL (ref 13.0–17.0)
LYMPHS PCT: 14 % (ref 12–46)
Lymphs Abs: 1.4 10*3/uL (ref 0.7–4.0)
MCH: 31.9 pg (ref 26.0–34.0)
MCHC: 32.9 g/dL (ref 30.0–36.0)
MCV: 96.8 fL (ref 78.0–100.0)
Monocytes Absolute: 0.9 10*3/uL (ref 0.1–1.0)
Monocytes Relative: 9 % (ref 3–12)
NEUTROS PCT: 75 % (ref 43–77)
Neutro Abs: 7.5 10*3/uL (ref 1.7–7.7)
PLATELETS: 480 10*3/uL — AB (ref 150–400)
RBC: 4.42 MIL/uL (ref 4.22–5.81)
RDW: 12 % (ref 11.5–15.5)
WBC: 10 10*3/uL (ref 4.0–10.5)

## 2014-11-10 LAB — PROTIME-INR
INR: 0.96 (ref 0.00–1.49)
Prothrombin Time: 13 seconds (ref 11.6–15.2)

## 2014-11-10 LAB — CBG MONITORING, ED: GLUCOSE-CAPILLARY: 108 mg/dL — AB (ref 65–99)

## 2014-11-10 LAB — LITHIUM LEVEL: LITHIUM LVL: 0.67 mmol/L (ref 0.60–1.20)

## 2014-11-10 LAB — SALICYLATE LEVEL: Salicylate Lvl: <4 mg/dL (ref 2.8–30.0)

## 2014-11-10 LAB — BLOOD GAS, ARTERIAL
Acid-Base Excess: 5.1 mmol/L — ABNORMAL HIGH (ref 0.0–2.0)
Bicarbonate: 29.2 mEq/L — ABNORMAL HIGH (ref 20.0–24.0)
Drawn by: 23534
O2 CONTENT: 2.5 L/min
O2 SAT: 96.6 %
PATIENT TEMPERATURE: 37
TCO2: 25.6 mmol/L (ref 0–100)
pCO2 arterial: 44.2 mmHg (ref 35.0–45.0)
pH, Arterial: 7.435 (ref 7.350–7.450)
pO2, Arterial: 89.5 mmHg (ref 80.0–100.0)

## 2014-11-10 LAB — AMMONIA: AMMONIA: 7 umol/L — AB (ref 9–35)

## 2014-11-10 LAB — ACETAMINOPHEN LEVEL

## 2014-11-10 LAB — TSH: TSH: 12.805 u[IU]/mL — ABNORMAL HIGH (ref 0.350–4.500)

## 2014-11-10 LAB — I-STAT CG4 LACTIC ACID, ED: LACTIC ACID, VENOUS: 1.74 mmol/L (ref 0.5–2.0)

## 2014-11-10 LAB — ETHANOL

## 2014-11-10 LAB — MAGNESIUM: MAGNESIUM: 2.1 mg/dL (ref 1.7–2.4)

## 2014-11-10 MED ORDER — LORAZEPAM 1 MG PO TABS: 1.0000 mg | ORAL_TABLET | Freq: Four times a day (QID) | ORAL | Status: DC | PRN

## 2014-11-10 MED ORDER — BUPROPION HCL ER (XL) 150 MG PO TB24
150.0000 mg | ORAL_TABLET | Freq: Every day | ORAL | Status: DC
  Administered 2014-11-11: 150 mg via ORAL
  Filled 2014-11-10 (×4): qty 1

## 2014-11-10 MED ORDER — M.V.I. ADULT IV INJ
INJECTION | INTRAVENOUS | Status: AC
Start: 2014-11-10 — End: 2014-11-10
  Filled 2014-11-10: qty 10

## 2014-11-10 MED ORDER — NALOXONE HCL 0.4 MG/ML IJ SOLN
1.0000 mg | Freq: Once | INTRAMUSCULAR | Status: AC
  Administered 2014-11-10: 1 mg via INTRAVENOUS
  Filled 2014-11-10: qty 3

## 2014-11-10 MED ORDER — ADULT MULTIVITAMIN W/MINERALS CH
1.0000 | ORAL_TABLET | Freq: Every day | ORAL | Status: DC
  Administered 2014-11-11 – 2014-11-12 (×2): 1 via ORAL
  Filled 2014-11-10 (×2): qty 1

## 2014-11-10 MED ORDER — LORAZEPAM 2 MG/ML IJ SOLN
0.0000 mg | Freq: Four times a day (QID) | INTRAMUSCULAR | Status: DC
  Administered 2014-11-10 – 2014-11-11 (×2): 1 mg via INTRAVENOUS
  Filled 2014-11-10 (×2): qty 1

## 2014-11-10 MED ORDER — VITAMIN B-1 100 MG PO TABS
100.0000 mg | ORAL_TABLET | Freq: Every day | ORAL | Status: DC
  Administered 2014-11-11 – 2014-11-12 (×2): 100 mg via ORAL
  Filled 2014-11-10 (×2): qty 1

## 2014-11-10 MED ORDER — LORAZEPAM 2 MG/ML IJ SOLN: 0.0000 mg | Freq: Two times a day (BID) | INTRAMUSCULAR | Status: DC

## 2014-11-10 MED ORDER — SODIUM CHLORIDE 0.9 % IV SOLN
INTRAVENOUS | Status: DC
  Administered 2014-11-11: 06:00:00 via INTRAVENOUS

## 2014-11-10 MED ORDER — ALBUTEROL SULFATE (2.5 MG/3ML) 0.083% IN NEBU: 2.5000 mg | INHALATION_SOLUTION | RESPIRATORY_TRACT | Status: DC | PRN

## 2014-11-10 MED ORDER — ALBUTEROL SULFATE (2.5 MG/3ML) 0.083% IN NEBU: 2.5000 mg | INHALATION_SOLUTION | Freq: Four times a day (QID) | RESPIRATORY_TRACT | Status: DC

## 2014-11-10 MED ORDER — FOLIC ACID 5 MG/ML IJ SOLN
INTRAMUSCULAR | Status: AC
Start: 2014-11-10 — End: 2014-11-10
  Filled 2014-11-10: qty 0.2

## 2014-11-10 MED ORDER — FOLIC ACID 1 MG PO TABS
1.0000 mg | ORAL_TABLET | Freq: Every day | ORAL | Status: DC
  Administered 2014-11-11 – 2014-11-12 (×2): 1 mg via ORAL
  Filled 2014-11-10 (×2): qty 1

## 2014-11-10 MED ORDER — LORAZEPAM 2 MG/ML IJ SOLN: 1.0000 mg | Freq: Four times a day (QID) | INTRAMUSCULAR | Status: DC | PRN

## 2014-11-10 MED ORDER — ALUM & MAG HYDROXIDE-SIMETH 200-200-20 MG/5ML PO SUSP: 30.0000 mL | Freq: Four times a day (QID) | ORAL | Status: DC | PRN

## 2014-11-10 MED ORDER — ONDANSETRON HCL 4 MG PO TABS: 4.0000 mg | ORAL_TABLET | Freq: Four times a day (QID) | ORAL | Status: DC | PRN

## 2014-11-10 MED ORDER — LORAZEPAM 2 MG/ML IJ SOLN
INTRAMUSCULAR | Status: AC
  Administered 2014-11-10: 2 mg via INTRAVENOUS
  Filled 2014-11-10: qty 1

## 2014-11-10 MED ORDER — DULOXETINE HCL 60 MG PO CPEP
60.0000 mg | ORAL_CAPSULE | Freq: Two times a day (BID) | ORAL | Status: DC
  Administered 2014-11-11 – 2014-11-12 (×3): 60 mg via ORAL
  Filled 2014-11-10 (×3): qty 1

## 2014-11-10 MED ORDER — LEVOTHYROXINE SODIUM 88 MCG PO TABS
88.0000 ug | ORAL_TABLET | Freq: Every day | ORAL | Status: DC
  Administered 2014-11-11 – 2014-11-12 (×2): 88 ug via ORAL
  Filled 2014-11-10 (×2): qty 1

## 2014-11-10 MED ORDER — ONDANSETRON HCL 4 MG/2ML IJ SOLN: 4.0000 mg | Freq: Four times a day (QID) | INTRAMUSCULAR | Status: DC | PRN

## 2014-11-10 MED ORDER — LORAZEPAM 2 MG/ML IJ SOLN
0.0000 mg | Freq: Four times a day (QID) | INTRAMUSCULAR | Status: DC
  Administered 2014-11-10: 2 mg via INTRAVENOUS
  Filled 2014-11-10: qty 1

## 2014-11-10 MED ORDER — ENOXAPARIN SODIUM 40 MG/0.4ML ~~LOC~~ SOLN
40.0000 mg | SUBCUTANEOUS | Status: DC
  Administered 2014-11-10 – 2014-11-11 (×2): 40 mg via SUBCUTANEOUS
  Filled 2014-11-10 (×2): qty 0.4

## 2014-11-10 MED ORDER — ALBUTEROL SULFATE (2.5 MG/3ML) 0.083% IN NEBU
2.5000 mg | INHALATION_SOLUTION | RESPIRATORY_TRACT | Status: DC | PRN
Start: 2014-11-10 — End: 2014-11-10

## 2014-11-10 MED ORDER — LORAZEPAM 2 MG/ML IJ SOLN
2.0000 mg | Freq: Once | INTRAMUSCULAR | Status: AC
  Administered 2014-11-10: 2 mg via INTRAVENOUS

## 2014-11-10 MED ORDER — THIAMINE HCL 100 MG/ML IJ SOLN
Freq: Once | INTRAVENOUS | Status: DC
  Administered 2014-11-10: 23:00:00 via INTRAVENOUS
  Filled 2014-11-10: qty 1000

## 2014-11-10 MED ORDER — THIAMINE HCL 100 MG/ML IJ SOLN
INTRAMUSCULAR | Status: AC
  Filled 2014-11-10: qty 2

## 2014-11-10 MED ORDER — ONDANSETRON HCL 4 MG/2ML IJ SOLN: 4.0000 mg | Freq: Three times a day (TID) | INTRAMUSCULAR | Status: DC | PRN

## 2014-11-10 MED ORDER — THIAMINE HCL 100 MG/ML IJ SOLN
100.0000 mg | Freq: Every day | INTRAMUSCULAR | Status: DC
  Administered 2014-11-10: 100 mg via INTRAVENOUS
  Filled 2014-11-10: qty 2

## 2014-11-10 MED ORDER — LITHIUM CARBONATE 300 MG PO CAPS
300.0000 mg | ORAL_CAPSULE | Freq: Three times a day (TID) | ORAL | Status: DC
  Administered 2014-11-11 – 2014-11-12 (×4): 300 mg via ORAL
  Filled 2014-11-10 (×12): qty 1

## 2014-11-10 NOTE — ED Provider Notes (Signed)
54 year old male, history of alcohol abuse, according to his mother he stopped 1 week ago, he lives with his mother, he was recently admitted to an outside hospital because of what sounds like seizures according to the mother. She took him home from the hospital, he was able to ambulate with her assistance and has had normal speech until this afternoon when he became altered. According to the mother he was unable to remember that he had taken his oxycodone already today, he insisted that she give it to him again, she refused, a short time later he started to have incomprehensible mumbling speech, altered mental status and the inability to walk or follow commands. Paramedics were called, they found the patient to be agitated, tachycardic, he was given 2.5 mg of Versed prior to arrival.  On exam the patient appears agitated, does not follow commands, has diffuse tremor, points at the ceiling and mumbles, no peripheral edema, soft abdomen, clear lung sounds though this is minimally evaluated given the patient's poor inspiratory effort with request, tachycardic at 115 bpm with good pulses at the radial arteries, mucous membranes appear dry, red, there is a frothy sputum in his mouth, he has hyperreflexia at the knees and brachial radialis, reaches with bilateral hands to withdraw from exam, pupils are 1-2 mm and minimally reactive, no asymmetry, conjunctivae appear normal, no signs of head trauma  The patient has an abnormal EKG in that he has a tachycardia, he will need full workup for altered mental status though I suspect this is related to alcohol withdrawal, substance abuse or hepatic encephalopathy as the patient does have a history of hepatitis and cirrhosis.  The patient has responded to intravenous Ativan, significant improvement in his mental status though he still confused and hallucinating, still tachycardic, the patient is critically ill with delirium tremens, he will be admitted to the stepdown unit  under the care of Dr. Shanon Brow, banana bag given, continue Ativan as needed  CRITICAL CARE Performed by: Johnna Acosta Total critical care time: 35 Critical care time was exclusive of separately billable procedures and treating other patients. Critical care was necessary to treat or prevent imminent or life-threatening deterioration. Critical care was time spent personally by me on the following activities: development of treatment plan with patient and/or surrogate as well as nursing, discussions with consultants, evaluation of patient's response to treatment, examination of patient, obtaining history from patient or surrogate, ordering and performing treatments and interventions, ordering and review of laboratory studies, ordering and review of radiographic studies, pulse oximetry and re-evaluation of patient's condition.    EKG Interpretation  Date/Time:  Thursday Nov 10 2014 17:14:10 EDT Ventricular Rate:  121 PR Interval:  129 QRS Duration: 126 QT Interval:  388 QTC Calculation: 550 R Axis:   95 Text Interpretation:  Sinus tachycardia Nonspecific intraventricular conduction delay Artifact in lead(s) II III aVL aVF V6 Since last tracing artifact now present Confirmed by Billy Turvey  MD, Halton Neas ([id number]) on 11/10/2014 5:52:58 PM      Medical screening examination/treatment/procedure(s) were conducted as a shared visit with non-physician practitioner(s) and myself.  I personally evaluated the patient during the encounter.  Clinical Impression:   Final diagnoses:  Altered consciousness  Delirium tremens         Noemi Chapel, MD 11/11/14 [phone]

## 2014-11-10 NOTE — ED Provider Notes (Signed)
CSN: [id number]     Arrival date & time 11/10/14  1705 History   First MD Initiated Contact with Patient 11/10/14 1715     Chief Complaint  Patient presents with  . Shortness of Breath     (Consider location/radiation/quality/duration/timing/severity/associated sxs/prior Treatment) The history is limited by the condition of the patient.     Pt is a 54 year old man with complicated medical history including hepatitis, cirrhosis, COPD, bipolar, and is a known alcoholic recently released from [facility].  He presented to [facility] ED today after sister called 911 for shortness of breath.  Sister is not present to provide history, however, EMS states that upon arrival pt was combative and incomprehensible in his speech and they subsequently gave 2.5 of versed and 15L of oxygen.  Upon arrival to the ER he was less combative, but still mumbling incoherently and shaking.  His mother is at bedside, is very hard of hearing and is a poor historian.  His mother states that today he requested his medication from her, he is prescribed 5 oxy a day, and she gave him one, and a verbal altercation occurred over the phone, after which he had reported shortness of breath and gagging and then EMS was called by his sister who was home with him.   No futhur HPI or ROS is possible due to patient's altered mental status and difficult history given by present family.    Past Medical History  Diagnosis Date  . COPD (chronic obstructive pulmonary disease)   . Hypothyroidism   . BPH (benign prostatic hyperplasia)   . Anxiety   . Depression   . Cirrhosis   . Pancreatitis chronic   . Epicondylitis     hips  . Hepatitis C   . Bipolar disorder   . Oxygen decrease   . Shortness of breath   . GERD (gastroesophageal reflux disease)   . Head injury   . ETOH abuse    Past Surgical History  Procedure Laterality Date  . Total hip arthroplasty  1 year ago    right hip-[place]  . Appendectomy  age 49  . Femur  fracture surgery      otif  . Transurethral resection of prostate  05/23/2011    Procedure: TRANSURETHRAL RESECTION OF THE PROSTATE (TURP);  Surgeon: Marissa Nestle;  Location: [facility];  Service: Urology;  Laterality: N/A;  placement of suprapubic catheter  . Colonoscopy  07/22/2012    Procedure: COLONOSCOPY;  Surgeon: Rogene Houston, MD;  [facility];  Service: Endoscopy;  Laterality: N/A;  225-moved to 200 Ann notified pt  . Fasciotomy Left 08/30/2012    Procedure: FASCIOTOMY;  Surgeon: Johnn Hai, MD;  Location[facility];  Service: Orthopedics;  Laterality: Left;  . I&d extremity Left 09/03/2012    Procedure: IRRIGATION AND DEBRIDEMENT EXTREMITY;  Surgeon: Johnny Bridge, MD;  Location: [place];  Service: Orthopedics;  Laterality: Left;  . Skin split graft Left 09/08/2012    Procedure:  SPLIT THICKNESS SKIN GRAFT LEFT LEG ;  Surgeon: Rozanna Box, MD;  Location: [place];  Service: Orthopedics;  Laterality: Left;   Family History  Problem Relation Age of Onset  . Anesthesia problems Neg Hx   . Hypotension Neg Hx   . Pseudochol deficiency Neg Hx   . Malignant hyperthermia Neg Hx    History  Substance Use Topics  . Smoking status: Current Every Day Smoker -- 1.00 packs/day for 30 years  Types: Cigarettes  . Smokeless tobacco: Not on file  . Alcohol Use: Yes     Comment: quit drinking this month. Had been drinking "alot"    Review of Systems  Unable to perform ROS     Allergies  Penicillins; Vancomycin; Codeine; and Tylenol  Home Medications   Prior to Admission medications   Medication Sig Start Date End Date Taking? Authorizing Provider  buPROPion (WELLBUTRIN XL) 150 MG 24 hr tablet Take 150 mg by mouth every morning.     Yes Historical Provider, MD  DULoxetine (CYMBALTA) 60 MG capsule Take 60 mg by mouth 2 (two) times daily.     Yes Historical Provider, MD  levothyroxine (SYNTHROID, LEVOTHROID) 88 MCG tablet Take 88 mcg by mouth daily.   Yes Historical  Provider, MD  lithium carbonate 300 MG capsule Take 300 mg by mouth 3 (three) times daily with meals.     Yes Historical Provider, MD  Oxycodone HCl 10 MG TABS Take 10 mg by mouth 5 (five) times daily as needed (FOR PAIN).   Yes Historical Provider, MD  OXYGEN Inhale 2.5 L into the lungs daily.   Yes Historical Provider, MD  promethazine (PHENERGAN) 25 MG tablet Take 25 mg by mouth every 6 (six) hours as needed for nausea or vomiting. Nausea and vomiting   Yes Historical Provider, MD  diazepam (VALIUM) 10 MG tablet Take 10 mg by mouth every 8 (eight) hours as needed. For anxiety     Historical Provider, MD  doxycycline (VIBRAMYCIN) 100 MG capsule Take 1 capsule (100 mg total) by mouth 2 (two) times daily. Patient not taking: Reported on 11/10/2014 03/26/14   Veryl Speak, MD  fentaNYL (DURAGESIC - DOSED MCG/HR) 100 MCG/HR Place 3 patches onto the skin every other day. This dose was verified, it is correct    Historical Provider, MD  oxyCODONE (ROXICODONE) 5 MG immediate release tablet Take 1 tablet (5 mg total) by mouth every 6 (six) hours as needed for severe pain. Patient not taking: Reported on 11/10/2014 04/09/14   Ashley Murrain, NP   BP 169/111 mmHg  Pulse 122  Temp(Src) 98 F (36.7 C) (Oral)  Resp 24  SpO2 96% Physical Exam  Constitutional: He does not have a sickly appearance. He appears ill. He appears distressed.  Appears chronically ill and older than stated age, distressed and aggitated  HENT:  Head: Normocephalic and atraumatic.  Eyes: Lids are normal. Right eye exhibits no discharge. Left eye exhibits no discharge. Right conjunctiva is not injected. Left conjunctiva is not injected. No scleral icterus.  Pupils 2-3 mm, minimally reactive to light  Neck: Normal range of motion. No JVD present. No tracheal deviation present. No thyromegaly present.  Cardiovascular: Regular rhythm, normal heart sounds and intact distal pulses.  Tachycardia present.  Exam reveals no gallop and no  friction rub.   No murmur heard. Pulmonary/Chest: No accessory muscle usage. Tachypnea noted. No respiratory distress. He has no wheezes. He has no rales. He exhibits no tenderness and no retraction.  Abdominal: Soft. Bowel sounds are normal. He exhibits no distension and no mass. There is no tenderness. There is no rebound and no guarding.  Musculoskeletal: He exhibits tenderness. He exhibits no edema.  Pt tender in left knee, wearing compression stalking bilaterally   Lymphadenopathy:    He has no cervical adenopathy.  Neurological: He is disoriented. He displays abnormal reflex. No cranial nerve deficit. He exhibits abnormal muscle tone. Coordination normal.  Skin: Skin is warm and dry.  Bruising, ecchymosis, petechiae and rash noted. He is not diaphoretic. No erythema. No pallor.  Diffuse petechial rash bilaterally on lower extremities and on back  Psychiatric: He is agitated and actively hallucinating. Cognition and memory are impaired.  Intermittently hyperactive and lethargic, unintelligible speech and mumbling  Nursing note and vitals reviewed.   ED Course  Procedures (including critical care time) Labs Review Labs Reviewed  CBG MONITORING, ED - Abnormal; Notable for the following:    Glucose-Capillary 108 (*)    All other components within normal limits  PROTIME-INR  CBC WITH DIFFERENTIAL/PLATELET  SALICYLATE LEVEL  ACETAMINOPHEN LEVEL  COMPREHENSIVE METABOLIC PANEL  ETHANOL  URINALYSIS, ROUTINE W REFLEX MICROSCOPIC  BLOOD GAS, ARTERIAL  AMMONIA  I-STAT CG4 LACTIC ACID, ED  I-STAT CHEM 8, ED    Imaging Review No results found.   EKG Interpretation None      MDM   Final diagnoses:  Altered consciousness  Altered consciousness    Pt initially presented encephalopathic, tachycardic, tachypnic, with minimal pupillary response to light, and was also hallucinating, mumbling incoherently, with diffused tremor.  He is unable to follow commands.  He received 2.5 of  versed via EMS which they report calmed his aggitation and combativeness.  He received 2 of ativan in the ED. History is difficult to obtain from his mother, who is at bedside.  She states that he stopped drinking alcohol a week ago and was admitted to another facility for a similar presentation.  She was uncomfortable with him being discharged home, says he could walk at that time, but she had to assist him and he did well until today when his sister was home with him until she called 911 for shortness of breath.  His mother also mentions some kind of alteration or misunderstanding between her and the pt regarding his oxycodone prescription.  She administers his oxycodone as prescribed, but today patient forgot he had already taken it, and there was some kind of altercation.  The prescription bottle brought in with him by EMS should have had roughly 50 pills remaining in prescription, and instead there were a few different pills.    Altered mental status ddx is broad and includes DT's, overdose, metabolic encephalopathy due to hepatitis, infection, hypothyroid, or possibly a psychotic episode, suspect that most likely it is related to substance abuse and withdrawal.  He has no head trauma and CBG was normal.  Plan:  AMS work up  Pt responded to IV ativan, was oriented to person and able to answer questions and pupils were 3-4 mm, equal and more reactive to light.  His alertness and lethargy remains intermittent, and he will need to be admitted to ICU or stepdown for CIWA.    Delsa Grana, PA-C 11/11/14 0263  Noemi Chapel, MD 11/11/14 [phone]

## 2014-11-10 NOTE — H&P (Signed)
PCP:   Alonza Bogus, MD   Chief Complaint:  ams  HPI: 54 yo male h/o etoh abuse, cirrhosis, hep c, copd, bipolar disorder brought in by his mother for confusion, hallucinating.  Pt intitally tachycardic and confused and hallucinating in the ED.  No fevers.  Pt has received several rounds of versed and ativan and now he is in the stepdown unit.  His mentation is clear.  He says he was hospitalized last week at [facility] for alcohol withdrawal and seizure had has not drank any etoh in a week.  i have asked him this several times, and insists he has not drank in a week.  Denies any fevers.  No n/v/d.  He says he does this when he takes tylenol.  And has been taking percocet with tylenol.  He denies any pain right now.  No numb/tingling/weakness anywhere.    Review of Systems:  Positive and negative as per HPI otherwise all other systems are negative  Past Medical History: Past Medical History  Diagnosis Date  . COPD (chronic obstructive pulmonary disease)   . Hypothyroidism   . BPH (benign prostatic hyperplasia)   . Anxiety   . Depression   . Cirrhosis   . Pancreatitis chronic   . Epicondylitis     hips  . Hepatitis C   . Bipolar disorder   . Oxygen decrease   . Shortness of breath   . GERD (gastroesophageal reflux disease)   . Head injury   . ETOH abuse    Past Surgical History  Procedure Laterality Date  . Total hip arthroplasty  1 year ago    right hip-[place]  . Appendectomy  age 33  . Femur fracture surgery      otif  . Transurethral resection of prostate  05/23/2011    Procedure: TRANSURETHRAL RESECTION OF THE PROSTATE (TURP);  Surgeon: Marissa Nestle;  Location: [facility];  Service: Urology;  Laterality: N/A;  placement of suprapubic catheter  . Colonoscopy  07/22/2012    Procedure: COLONOSCOPY;  Surgeon: Rogene Houston, MD;  Location: [facility];  Service: Endoscopy;  Laterality: N/A;  225-moved to 200 Ann notified pt  . Fasciotomy Left 08/30/2012   Procedure: FASCIOTOMY;  Surgeon: Johnn Hai, MD;  Location: [facility];  Service: Orthopedics;  Laterality: Left;  . I&d extremity Left 09/03/2012    Procedure: IRRIGATION AND DEBRIDEMENT EXTREMITY;  Surgeon: Johnny Bridge, MD;  Location: [place];  Service: Orthopedics;  Laterality: Left;  . Skin split graft Left 09/08/2012    Procedure:  SPLIT THICKNESS SKIN GRAFT LEFT LEG ;  Surgeon: Rozanna Box, MD;  Location: [place];  Service: Orthopedics;  Laterality: Left;    Medications: Prior to Admission medications   Medication Sig Start Date End Date Taking? Authorizing Provider  buPROPion (WELLBUTRIN XL) 150 MG 24 hr tablet Take 150 mg by mouth every morning.     Yes Historical Provider, MD  DULoxetine (CYMBALTA) 60 MG capsule Take 60 mg by mouth 2 (two) times daily.     Yes Historical Provider, MD  fentaNYL (DURAGESIC - DOSED MCG/HR) 100 MCG/HR Place 3 patches onto the skin every other day. This dose was verified, it is correct   Yes Historical Provider, MD  levothyroxine (SYNTHROID, LEVOTHROID) 88 MCG tablet Take 88 mcg by mouth daily.   Yes Historical Provider, MD  lithium carbonate 300 MG capsule Take 300 mg by mouth 3 (three) times daily with meals.     Yes Historical  Provider, MD  methylPREDNISolone (MEDROL DOSEPAK) 4 MG TBPK tablet Take 4-24 mg by mouth See admin instructions. TAKE AS DIRECTED PER PACKAGE INSTRUCTIONS STARTING ON 11/08/2014 (6,5,4,3,2,1)   Yes Historical Provider, MD  Oxycodone HCl 10 MG TABS Take 10 mg by mouth 5 (five) times daily as needed (FOR PAIN).   Yes Historical Provider, MD  OXYGEN Inhale 2.5 L into the lungs daily.   Yes Historical Provider, MD  promethazine (PHENERGAN) 25 MG tablet Take 25 mg by mouth every 6 (six) hours as needed for nausea or vomiting. Nausea and vomiting   Yes Historical Provider, MD  doxycycline (VIBRAMYCIN) 100 MG capsule Take 1 capsule (100 mg total) by mouth 2 (two) times daily. Patient not taking: Reported on 11/10/2014 03/26/14   Veryl Speak, MD  oxyCODONE (ROXICODONE) 5 MG immediate release tablet Take 1 tablet (5 mg total) by mouth every 6 (six) hours as needed for severe pain. Patient not taking: Reported on 11/10/2014 04/09/14   Ashley Murrain, NP    Allergies:   Allergies  Allergen Reactions  . Penicillins Anaphylaxis  . Vancomycin     redman syndrome  . Codeine Hives  . Tylenol [Acetaminophen] Other (See Comments)    Patient has hepatitis    Social History:  reports that he has been smoking Cigarettes.  He has a 30 pack-year smoking history. He does not have any smokeless tobacco history on file. He reports that he drinks alcohol. He reports that he uses illicit drugs (Marijuana).  Family History: Family History  Problem Relation Age of Onset  . Anesthesia problems Neg Hx   . Hypotension Neg Hx   . Pseudochol deficiency Neg Hx   . Malignant hyperthermia Neg Hx     Physical Exam: Filed Vitals:   11/10/14 1900 11/10/14 1930 11/10/14 1954 11/10/14 1956  BP: 131/108 150/89 158/84 150/89  Pulse:   106 108  Temp:   98.3 F (36.8 C)   TempSrc:   Oral   Resp: 23 17 20    SpO2:   98%    General appearance: alert, cooperative and no distress Head: Normocephalic, without obvious abnormality, atraumatic Eyes: negative Nose: Nares normal. Septum midline. Mucosa normal. No drainage or sinus tenderness. Neck: no JVD and supple, symmetrical, trachea midline Lungs: clear to auscultation bilaterally Heart: regular rate and rhythm, S1, S2 normal, no murmur, click, rub or gallop Abdomen: soft, non-tender; bowel sounds normal; no masses,  no organomegaly Extremities: extremities normal, atraumatic, no cyanosis or edema Pulses: 2+ and symmetric Skin: Skin color, texture, turgor normal. No rashes or lesions petechia Neurologic: Grossly normal    Labs on Admission:   Recent Labs  11/10/14 1715 11/10/14 1828  NA 141 141  K 3.7 3.6  CL 104 101  CO2 28  --   GLUCOSE 115* 114*  BUN 8 5*  CREATININE 0.84 0.80   CALCIUM 9.5  --     Recent Labs  11/10/14 1715  AST 41  ALT 39  ALKPHOS 78  BILITOT 0.5  PROT 6.9  ALBUMIN 3.6    Recent Labs  11/10/14 1715 11/10/14 1828  WBC 10.0  --   NEUTROABS 7.5  --   HGB 14.1 15.6  HCT 42.8 46.0  MCV 96.8  --   PLT 480*  --     Recent Labs  11/10/14 1715  TSH 12.805*   Radiological Exams on Admission: Ct Head Wo Contrast  11/10/2014   CLINICAL DATA:  Altered consciousness.  Respiratory distress.  EXAM:  CT HEAD WITHOUT CONTRAST  TECHNIQUE: Contiguous axial images were obtained from the base of the skull through the vertex without intravenous contrast.  COMPARISON:  11/09/2013; 08/30/2012  FINDINGS: Similar findings of advanced atrophy with diffuse sulcal prominence. Gray-white differentiation is maintained without CT evidence of acute large territory infarct. No intraparenchymal or extra-axial mass or hemorrhage. Normal size and configuration of the ventricles and basilar cisterns. No midline shift. Limited visualization the paranasal sinuses and mastoid air cells is normal. No air-fluid levels. Regional soft tissues appear normal.  IMPRESSION: Age advanced atrophy without acute intracranial process.   Electronically Signed   By: Sandi Mariscal M.D.   On: 11/10/2014 18:12   Dg Chest Port 1 View  11/10/2014   CLINICAL DATA:  Respiratory distress  EXAM: PORTABLE CHEST - 1 VIEW  COMPARISON:  11/03/2014  FINDINGS: The heart size and mediastinal contours are within normal limits. Both lungs are clear. The visualized skeletal structures are unremarkable.  IMPRESSION: No active disease.   Electronically Signed   By: Kathreen Devoid   On: 11/10/2014 18:36   cxr and ekg independently reviewed by myself Old records reviewed by myself  Assessment/Plan  54 yo male with h/o etoh abuse, chronic pain here with confusion, and possible etoh withdrawal  Principal Problem:   Possible Delirium tremens-  Unsure if whether can trust his history of no etoh use in a week.   Has improved with ativan in the ED.  Place on ciwa protocol with iv ativan ordered.  Monitor closely for signs/sypttoms of withdrawal and seizures.   Was also given a dose of narcan earlier.  Active Problems:   Chronic pancreatitis-  stable   Cirrhosis-  stable   Hepatitis C-  stable   Hypothyroid-  Check free t4 level, resume home synthroid   COPD (chronic obstructive pulmonary disease)-  stable   Chronic pain-  On multiple controlled substances, hold for now   Bipolar disorder-  stable   H/O ETOH abuse   Encephalopathy acute-  As above,  Query if this has to do with his pain meds,  Doubt organic psych issue due to quick resolution of confusion.  No fevers or source of infection, ua is pending.  Lactic acid level is normal.    Admit to stepdown.  Full code.  Mikyla Schachter A 11/10/2014, 8:24 PM

## 2014-11-10 NOTE — ED Notes (Addendum)
Ems called out respiratory distress.  Pt shaking all over and has incomprehensible speech.   EMS gave 2.5 mg versed en route.   Pt known alcoholic.

## 2014-11-10 NOTE — ED Notes (Signed)
RT at bedside to collect blood gas.

## 2014-11-10 NOTE — ED Notes (Signed)
Pt is now opening eyes. Pt is shaking his head but no communicating with words.   Pt has airway intact.

## 2014-11-11 LAB — URINALYSIS, ROUTINE W REFLEX MICROSCOPIC
Bilirubin Urine: NEGATIVE
Glucose, UA: NEGATIVE mg/dL
HGB URINE DIPSTICK: NEGATIVE
KETONES UR: NEGATIVE mg/dL
Leukocytes, UA: NEGATIVE
NITRITE: NEGATIVE
PROTEIN: NEGATIVE mg/dL
Specific Gravity, Urine: 1.015 (ref 1.005–1.030)
Urobilinogen, UA: 0.2 mg/dL (ref 0.0–1.0)
pH: 7.5 (ref 5.0–8.0)

## 2014-11-11 LAB — BASIC METABOLIC PANEL
ANION GAP: 5 (ref 5–15)
BUN: 7 mg/dL (ref 6–20)
CO2: 30 mmol/L (ref 22–32)
CREATININE: 0.8 mg/dL (ref 0.61–1.24)
Calcium: 8.7 mg/dL — ABNORMAL LOW (ref 8.9–10.3)
Chloride: 109 mmol/L (ref 101–111)
GFR calc non Af Amer: >60 mL/min (ref >60)
GLUCOSE: 93 mg/dL (ref 65–99)
Potassium: 3.5 mmol/L (ref 3.5–5.1)
Sodium: 144 mmol/L (ref 135–145)

## 2014-11-11 LAB — RAPID URINE DRUG SCREEN, HOSP PERFORMED
Amphetamines: NOT DETECTED
Barbiturates: NOT DETECTED
Benzodiazepines: POSITIVE — AB
Cocaine: NOT DETECTED
OPIATES: NOT DETECTED
Tetrahydrocannabinol: POSITIVE — AB

## 2014-11-11 LAB — CBC
HCT: 39.3 % (ref 39.0–52.0)
HEMOGLOBIN: 12.7 g/dL — AB (ref 13.0–17.0)
MCH: 31.8 pg (ref 26.0–34.0)
MCHC: 32.3 g/dL (ref 30.0–36.0)
MCV: 98.3 fL (ref 78.0–100.0)
Platelets: 440 10*3/uL — ABNORMAL HIGH (ref 150–400)
RBC: 4 MIL/uL — ABNORMAL LOW (ref 4.22–5.81)
RDW: 12.3 % (ref 11.5–15.5)
WBC: 8.3 10*3/uL (ref 4.0–10.5)

## 2014-11-11 LAB — CLOSTRIDIUM DIFFICILE BY PCR: Toxigenic C. Difficile by PCR: NEGATIVE

## 2014-11-11 LAB — T4, FREE: FREE T4: 0.91 ng/dL (ref 0.61–1.12)

## 2014-11-11 LAB — PROTIME-INR
INR: 1.05 (ref 0.00–1.49)
Prothrombin Time: 13.9 seconds (ref 11.6–15.2)

## 2014-11-11 LAB — MRSA PCR SCREENING: MRSA by PCR: NEGATIVE

## 2014-11-11 MED ORDER — OXYCODONE HCL 5 MG PO TABS
5.0000 mg | ORAL_TABLET | Freq: Four times a day (QID) | ORAL | Status: DC | PRN
  Administered 2014-11-11 – 2014-11-12 (×4): 5 mg via ORAL
  Filled 2014-11-11 (×4): qty 1

## 2014-11-11 MED ORDER — SODIUM CHLORIDE 0.9 % IV SOLN
INTRAVENOUS | Status: AC
Start: 2014-11-11 — End: 2014-11-11
  Administered 2014-11-11: 50 mL/h via INTRAVENOUS

## 2014-11-11 MED ORDER — FENTANYL 75 MCG/HR TD PT72
150.0000 ug | MEDICATED_PATCH | TRANSDERMAL | Status: DC
  Administered 2014-11-11: 150 ug via TRANSDERMAL
  Filled 2014-11-11: qty 2

## 2014-11-11 NOTE — Progress Notes (Addendum)
Subjective: I think the events are as follows. He had a seizure and was taken to [facility] and treated there. That may have been an alcohol withdrawal seizure. Since then he has not had any alcohol and he decided to take himself off of his oral pain medication and I think what happened to him yesterday was probably withdrawal from pain medication. He is back to baseline now. He also had what appeared to be an allergic reaction from Percocet. He has tolerated oxycodone immediate release  Objective: Vital signs in last 24 hours: Temp:  [98 F (36.7 C)-98.3 F (36.8 C)] 98.1 F (36.7 C) (05/20 0400) Pulse Rate:  [84-122] 94 (05/20 0630) Resp:  [16-25] 21 (05/20 0630) BP: (130-169)/(71-111) 146/77 mmHg (05/20 0630) SpO2:  [95 %-100 %] 97 % (05/20 0630) FiO2 (%):  [21 %] 21 % (05/20 0400) Weight:  [63.6 kg (140 lb 3.4 oz)-64.4 kg (141 lb 15.6 oz)] 64.4 kg (141 lb 15.6 oz) (05/20 0430) Weight change:  Last BM Date: 11/10/14  Intake/Output from previous day: 05/19 0701 - 05/20 0700 In: 1041.7 [I.V.:1041.7] Out: 900 [Urine:900]  PHYSICAL EXAM General appearance: alert, cooperative and no distress Resp: clear to auscultation bilaterally Cardio: regular rate and rhythm, S1, S2 normal, no murmur, click, rub or gallop GI: soft, non-tender; bowel sounds normal; no masses,  no organomegaly Extremities: extremities normal, atraumatic, no cyanosis or edema  Lab Results:  Results for orders placed or performed during the hospital encounter of 11/10/14 (from the past 48 hour(s))  Protime-INR     Status: None   Collection Time: 11/10/14  5:15 PM  Result Value Ref Range   Prothrombin Time 13.0 11.6 - 15.2 seconds   INR 0.96 0.00 - 1.49  CBC with Differential     Status: Abnormal   Collection Time: 11/10/14  5:15 PM  Result Value Ref Range   WBC 10.0 4.0 - 10.5 K/uL   RBC 4.42 4.22 - 5.81 MIL/uL   Hemoglobin 14.1 13.0 - 17.0 g/dL   HCT 42.8 39.0 - 52.0 %   MCV 96.8 78.0 - 100.0 fL    MCH 31.9 26.0 - 34.0 pg   MCHC 32.9 30.0 - 36.0 g/dL   RDW 12.0 11.5 - 15.5 %   Platelets 480 (H) 150 - 400 K/uL   Neutrophils Relative % 75 43 - 77 %   Neutro Abs 7.5 1.7 - 7.7 K/uL   Lymphocytes Relative 14 12 - 46 %   Lymphs Abs 1.4 0.7 - 4.0 K/uL   Monocytes Relative 9 3 - 12 %   Monocytes Absolute 0.9 0.1 - 1.0 K/uL   Eosinophils Relative 1 0 - 5 %   Eosinophils Absolute 0.1 0.0 - 0.7 K/uL   Basophils Relative 1 0 - 1 %   Basophils Absolute 0.1 0.0 - 0.1 K/uL  Salicylate level     Status: None   Collection Time: 11/10/14  5:15 PM  Result Value Ref Range   Salicylate Lvl <8.5 2.8 - 30.0 mg/dL  Acetaminophen level     Status: Abnormal   Collection Time: 11/10/14  5:15 PM  Result Value Ref Range   Acetaminophen (Tylenol), Serum <10 (L) 10 - 30 ug/mL    Comment:        THERAPEUTIC CONCENTRATIONS VARY SIGNIFICANTLY. A RANGE OF 10-30 ug/mL MAY BE AN EFFECTIVE CONCENTRATION FOR MANY PATIENTS. HOWEVER, SOME ARE BEST TREATED AT CONCENTRATIONS OUTSIDE THIS RANGE. ACETAMINOPHEN CONCENTRATIONS >150 ug/mL AT 4 HOURS AFTER INGESTION AND >50 ug/mL AT  12 HOURS AFTER INGESTION ARE OFTEN ASSOCIATED WITH TOXIC REACTIONS.   Comprehensive metabolic panel     Status: Abnormal   Collection Time: 11/10/14  5:15 PM  Result Value Ref Range   Sodium 141 135 - 145 mmol/L   Potassium 3.7 3.5 - 5.1 mmol/L   Chloride 104 101 - 111 mmol/L   CO2 28 22 - 32 mmol/L   Glucose, Bld 115 (H) 65 - 99 mg/dL   BUN 8 6 - 20 mg/dL   Creatinine, Ser 0.84 0.61 - 1.24 mg/dL   Calcium 9.5 8.9 - 10.3 mg/dL   Total Protein 6.9 6.5 - 8.1 g/dL   Albumin 3.6 3.5 - 5.0 g/dL   AST 41 15 - 41 U/L   ALT 39 17 - 63 U/L   Alkaline Phosphatase 78 38 - 126 U/L   Total Bilirubin 0.5 0.3 - 1.2 mg/dL   GFR calc non Af Amer >60 >60 mL/min   GFR calc Af Amer >60 >60 mL/min    Comment: (NOTE) The eGFR has been calculated using the CKD EPI equation. This calculation has not been validated in all clinical  situations. eGFR's persistently <60 mL/min signify possible Chronic Kidney Disease.    Anion gap 9 5 - 15  Ethanol     Status: None   Collection Time: 11/10/14  5:15 PM  Result Value Ref Range   Alcohol, Ethyl (B) <5 <5 mg/dL    Comment:        LOWEST DETECTABLE LIMIT FOR SERUM ALCOHOL IS 11 mg/dL FOR MEDICAL PURPOSES ONLY   TSH     Status: Abnormal   Collection Time: 11/10/14  5:15 PM  Result Value Ref Range   TSH 12.805 (H) 0.350 - 4.500 uIU/mL  Magnesium     Status: None   Collection Time: 11/10/14  5:15 PM  Result Value Ref Range   Magnesium 2.1 1.7 - 2.4 mg/dL  CBG monitoring, ED     Status: Abnormal   Collection Time: 11/10/14  5:48 PM  Result Value Ref Range   Glucose-Capillary 108 (H) 65 - 99 mg/dL  Blood gas, arterial     Status: Abnormal   Collection Time: 11/10/14  6:22 PM  Result Value Ref Range   O2 Content 2.5 L/min   Delivery systems NASAL CANNULA    pH, Arterial 7.435 7.350 - 7.450   pCO2 arterial 44.2 35.0 - 45.0 mmHg   pO2, Arterial 89.5 80.0 - 100.0 mmHg   Bicarbonate 29.2 (H) 20.0 - 24.0 mEq/L   TCO2 25.6 0 - 100 mmol/L   Acid-Base Excess 5.1 (H) 0.0 - 2.0 mmol/L   O2 Saturation 96.6 %   Patient temperature 37.0    Collection site RIGHT RADIAL    Drawn by [phone]    Sample type ARTERIAL    Allens test (pass/fail) PASS PASS  I-stat Chem 8, ED     Status: Abnormal   Collection Time: 11/10/14  6:28 PM  Result Value Ref Range   Sodium 141 135 - 145 mmol/L   Potassium 3.6 3.5 - 5.1 mmol/L   Chloride 101 101 - 111 mmol/L   BUN 5 (L) 6 - 20 mg/dL   Creatinine, Ser 0.80 0.61 - 1.24 mg/dL   Glucose, Bld 114 (H) 65 - 99 mg/dL   Calcium, Ion 1.25 (H) 1.12 - 1.23 mmol/L   TCO2 26 0 - 100 mmol/L   Hemoglobin 15.6 13.0 - 17.0 g/dL   HCT 46.0 39.0 - 52.0 %  I-Stat CG4  Lactic Acid, ED     Status: None   Collection Time: 11/10/14  6:29 PM  Result Value Ref Range   Lactic Acid, Venous 1.74 0.5 - 2.0 mmol/L  Ammonia     Status: Abnormal   Collection Time:  11/10/14  6:46 PM  Result Value Ref Range   Ammonia 7 (L) 9 - 35 umol/L  Lithium level     Status: None   Collection Time: 11/10/14  6:46 PM  Result Value Ref Range   Lithium Lvl 0.67 0.60 - 1.20 mmol/L  MRSA PCR Screening     Status: None   Collection Time: 11/10/14  9:20 PM  Result Value Ref Range   MRSA by PCR NEGATIVE NEGATIVE    Comment:        The GeneXpert MRSA Assay (FDA approved for NASAL specimens only), is one component of a comprehensive MRSA colonization surveillance program. It is not intended to diagnose MRSA infection nor to guide or monitor treatment for MRSA infections.   Urinalysis, Routine w reflex microscopic     Status: None   Collection Time: 11/11/14  3:15 AM  Result Value Ref Range   Color, Urine YELLOW YELLOW   APPearance CLEAR CLEAR   Specific Gravity, Urine 1.015 1.005 - 1.030   pH 7.5 5.0 - 8.0   Glucose, UA NEGATIVE NEGATIVE mg/dL   Hgb urine dipstick NEGATIVE NEGATIVE   Bilirubin Urine NEGATIVE NEGATIVE   Ketones, ur NEGATIVE NEGATIVE mg/dL   Protein, ur NEGATIVE NEGATIVE mg/dL   Urobilinogen, UA 0.2 0.0 - 1.0 mg/dL   Nitrite NEGATIVE NEGATIVE   Leukocytes, UA NEGATIVE NEGATIVE    Comment: MICROSCOPIC NOT DONE ON URINES WITH NEGATIVE PROTEIN, BLOOD, LEUKOCYTES, NITRITE, OR GLUCOSE <1000 mg/dL.  Urine rapid drug screen (hosp performed)     Status: Abnormal   Collection Time: 11/11/14  3:15 AM  Result Value Ref Range   Opiates NONE DETECTED NONE DETECTED   Cocaine NONE DETECTED NONE DETECTED   Benzodiazepines POSITIVE (A) NONE DETECTED   Amphetamines NONE DETECTED NONE DETECTED   Tetrahydrocannabinol POSITIVE (A) NONE DETECTED   Barbiturates NONE DETECTED NONE DETECTED    Comment:        DRUG SCREEN FOR MEDICAL PURPOSES ONLY.  IF CONFIRMATION IS NEEDED FOR ANY PURPOSE, NOTIFY LAB WITHIN 5 DAYS.        LOWEST DETECTABLE LIMITS FOR URINE DRUG SCREEN Drug Class       Cutoff (ng/mL) Amphetamine      1000 Barbiturate       200 Benzodiazepine   389 Tricyclics       373 Opiates          300 Cocaine          300 THC              50   Basic metabolic panel     Status: Abnormal   Collection Time: 11/11/14  4:41 AM  Result Value Ref Range   Sodium 144 135 - 145 mmol/L   Potassium 3.5 3.5 - 5.1 mmol/L   Chloride 109 101 - 111 mmol/L   CO2 30 22 - 32 mmol/L   Glucose, Bld 93 65 - 99 mg/dL   BUN 7 6 - 20 mg/dL   Creatinine, Ser 0.80 0.61 - 1.24 mg/dL   Calcium 8.7 (L) 8.9 - 10.3 mg/dL   GFR calc non Af Amer >60 >60 mL/min   GFR calc Af Amer >60 >60 mL/min    Comment: (NOTE) The  eGFR has been calculated using the CKD EPI equation. This calculation has not been validated in all clinical situations. eGFR's persistently <60 mL/min signify possible Chronic Kidney Disease.    Anion gap 5 5 - 15  CBC     Status: Abnormal   Collection Time: 11/11/14  4:41 AM  Result Value Ref Range   WBC 8.3 4.0 - 10.5 K/uL   RBC 4.00 (L) 4.22 - 5.81 MIL/uL   Hemoglobin 12.7 (L) 13.0 - 17.0 g/dL   HCT 39.3 39.0 - 52.0 %   MCV 98.3 78.0 - 100.0 fL   MCH 31.8 26.0 - 34.0 pg   MCHC 32.3 30.0 - 36.0 g/dL   RDW 12.3 11.5 - 15.5 %   Platelets 440 (H) 150 - 400 K/uL  Protime-INR     Status: None   Collection Time: 11/11/14  4:41 AM  Result Value Ref Range   Prothrombin Time 13.9 11.6 - 15.2 seconds   INR 1.05 0.00 - 1.49    ABGS  Recent Labs  11/10/14 1822 11/10/14 1828  PHART 7.435  --   PO2ART 89.5  --   TCO2 25.6 26  HCO3 29.2*  --    CULTURES Recent Results (from the past 240 hour(s))  MRSA PCR Screening     Status: None   Collection Time: 11/10/14  9:20 PM  Result Value Ref Range Status   MRSA by PCR NEGATIVE NEGATIVE Final    Comment:        The GeneXpert MRSA Assay (FDA approved for NASAL specimens only), is one component of a comprehensive MRSA colonization surveillance program. It is not intended to diagnose MRSA infection nor to guide or monitor treatment for MRSA infections.     Studies/Results: Ct Head Wo Contrast  11/10/2014   CLINICAL DATA:  Altered consciousness.  Respiratory distress.  EXAM: CT HEAD WITHOUT CONTRAST  TECHNIQUE: Contiguous axial images were obtained from the base of the skull through the vertex without intravenous contrast.  COMPARISON:  11/09/2013; 08/30/2012  FINDINGS: Similar findings of advanced atrophy with diffuse sulcal prominence. Gray-white differentiation is maintained without CT evidence of acute large territory infarct. No intraparenchymal or extra-axial mass or hemorrhage. Normal size and configuration of the ventricles and basilar cisterns. No midline shift. Limited visualization the paranasal sinuses and mastoid air cells is normal. No air-fluid levels. Regional soft tissues appear normal.  IMPRESSION: Age advanced atrophy without acute intracranial process.   Electronically Signed   By: Sandi Mariscal M.D.   On: 11/10/2014 18:12   Dg Chest Port 1 View  11/10/2014   CLINICAL DATA:  Respiratory distress  EXAM: PORTABLE CHEST - 1 VIEW  COMPARISON:  11/03/2014  FINDINGS: The heart size and mediastinal contours are within normal limits. Both lungs are clear. The visualized skeletal structures are unremarkable.  IMPRESSION: No active disease.   Electronically Signed   By: Kathreen Devoid   On: 11/10/2014 18:36    Medications:  Prior to Admission:  Prescriptions prior to admission  Medication Sig Dispense Refill Last Dose  . buPROPion (WELLBUTRIN XL) 150 MG 24 hr tablet Take 150 mg by mouth every morning.     11/10/2014 at Unknown time  . DULoxetine (CYMBALTA) 60 MG capsule Take 60 mg by mouth 2 (two) times daily.     11/10/2014 at Unknown time  . fentaNYL (DURAGESIC - DOSED MCG/HR) 100 MCG/HR Place 3 patches onto the skin every other day. This dose was verified, it is correct   Past Week at  Unknown time  . levothyroxine (SYNTHROID, LEVOTHROID) 88 MCG tablet Take 88 mcg by mouth daily.   11/10/2014 at Unknown time  . lithium carbonate 300 MG capsule  Take 300 mg by mouth 3 (three) times daily with meals.     11/10/2014 at Unknown time  . methylPREDNISolone (MEDROL DOSEPAK) 4 MG TBPK tablet Take 4-24 mg by mouth See admin instructions. TAKE AS DIRECTED PER PACKAGE INSTRUCTIONS STARTING ON 11/08/2014 (6,5,4,3,2,1)     . Oxycodone HCl 10 MG TABS Take 10 mg by mouth 5 (five) times daily as needed (FOR PAIN).   11/10/2014 at Unknown time  . OXYGEN Inhale 2.5 L into the lungs daily.   11/10/2014 at Unknown time  . promethazine (PHENERGAN) 25 MG tablet Take 25 mg by mouth every 6 (six) hours as needed for nausea or vomiting. Nausea and vomiting   11/10/2014 at Unknown time  . doxycycline (VIBRAMYCIN) 100 MG capsule Take 1 capsule (100 mg total) by mouth 2 (two) times daily. (Patient not taking: Reported on 11/10/2014) 20 capsule 0 03/31/2014 at Unknown time  . oxyCODONE (ROXICODONE) 5 MG immediate release tablet Take 1 tablet (5 mg total) by mouth every 6 (six) hours as needed for severe pain. (Patient not taking: Reported on 11/10/2014) 10 tablet 0    Scheduled: . buPROPion  150 mg Oral Daily  . DULoxetine  60 mg Oral BID  . enoxaparin (LOVENOX) injection  40 mg Subcutaneous Q24H  . folic acid  1 mg Oral Daily  . levothyroxine  88 mcg Oral QAC breakfast  . lithium carbonate  300 mg Oral TID WC  . LORazepam  0-4 mg Intravenous Q6H   Followed by  . [START ON 11/12/2014] LORazepam  0-4 mg Intravenous Q12H  . multivitamin with minerals  1 tablet Oral Daily  . thiamine  100 mg Oral Daily   Or  . thiamine  100 mg Intravenous Daily   Continuous: . sodium chloride 100 mL/hr at 11/11/14 0600   QHU:[id number], alum & mag hydroxide-simeth, LORazepam **OR** LORazepam, ondansetron **OR** ondansetron (ZOFRAN) IV  Assesment: I think he actually had narcotic withdrawal not delirium tremens from alcohol withdrawal. He has chronic pain from multiple sources having had a severe injury to his leg, cirrhosis of the liver and chronic pancreatitis. This is all complicated  by the fact that he has bipolar disorder. Principal Problem:   Delirium tremens Active Problems:   Chronic pancreatitis   Cirrhosis   Hepatitis C   Hypothyroid   COPD (chronic obstructive pulmonary disease)   Chronic pain   Bipolar disorder   H/O ETOH abuse   Encephalopathy acute    Plan: Since he is better and going to transfuse for him from stepdown put him on lower dose of his pain medication and I think we can try to get him into a program to get him off narcotics . I'm going to cut his Duragesic patch dose in half and cut his oxycodone dose in half    LOS: 1 day   Hiro Vipond L 11/11/2014, 7:42 AM

## 2014-11-11 NOTE — Care Management Note (Signed)
Case Management Note  Patient Details  Name: [name] MRN: [id number] Date of Birth: 1961-04-14  Expected Discharge Date:                  Expected Discharge Plan:  Home/Self Care  In-House Referral:  NA  Discharge planning Services  CM Consult  Post Acute Care Choice:  NA Choice offered to:  NA  DME Arranged:    DME Agency:     HH Arranged:    [place] Agency:     Status of Service:  Completed, signed off  Medicare Important Message Given:    Date Medicare IM Given:    Medicare IM give by:    Date Additional Medicare IM Given:    Additional Medicare Important Message give by:     If discussed at [place] of Stay Meetings, dates discussed:    Additional Comments: Pt is from home and independent at baseline. Possible discharge over weekend. Pt plans to discharge home with self care. No CM needs identified.   Sherald Barge, RN 11/11/2014, 3:02 PM

## 2014-11-12 DIAGNOSIS — F11288 Opioid dependence with other opioid-induced disorder: Secondary | ICD-10-CM

## 2014-11-12 DIAGNOSIS — F1123 Opioid dependence with withdrawal: Secondary | ICD-10-CM | POA: Diagnosis present

## 2014-11-12 DIAGNOSIS — F1193 Opioid use, unspecified with withdrawal: Secondary | ICD-10-CM | POA: Diagnosis present

## 2014-11-12 MED ORDER — LAMOTRIGINE 100 MG PO TABS
100.0000 mg | ORAL_TABLET | Freq: Two times a day (BID) | ORAL | Status: DC
  Filled 2014-11-12 (×5): qty 1

## 2014-11-12 MED ORDER — ADULT MULTIVITAMIN W/MINERALS CH: 1.0000 | ORAL_TABLET | Freq: Every day | ORAL | Status: DC

## 2014-11-12 MED ORDER — LITHIUM CARBONATE 300 MG PO CAPS
300.0000 mg | ORAL_CAPSULE | Freq: Two times a day (BID) | ORAL | Status: DC
  Filled 2014-11-12 (×5): qty 1

## 2014-11-12 MED ORDER — LAMOTRIGINE 100 MG PO TABS: 100.0000 mg | ORAL_TABLET | Freq: Two times a day (BID) | ORAL | Status: DC

## 2014-11-12 MED ORDER — FENTANYL 100 MCG/HR TD PT72: 100.0000 ug | MEDICATED_PATCH | TRANSDERMAL | Status: DC

## 2014-11-12 MED ORDER — LITHIUM CARBONATE 300 MG PO CAPS: 300.0000 mg | ORAL_CAPSULE | Freq: Two times a day (BID) | ORAL | Status: DC

## 2014-11-12 NOTE — Progress Notes (Signed)
Patient discharged with instructions, prescription, and care notes.  Verbalized understanding via teach back.  IV was removed and the site was WNL. Patient voiced no further complaints or concerns at the time of discharge.  Appointments scheduled per instructions.  Patient left the floor via w/c with staff and family in stable condition. 

## 2014-11-12 NOTE — Discharge Summary (Signed)
Physician Discharge Summary  Patient ID: [name] MRN: [id number] DOB/AGE: 1960-08-21 54 y.o. Primary Care Physician:Tatianna Ibbotson L, MD Admit date: 11/10/2014 Discharge date: 11/12/2014    Discharge Diagnoses:   Principal Problem:   Acute narcotic withdrawal with delirium Active Problems:   Chronic pancreatitis   Cirrhosis   Hepatitis C   Hypothyroid   COPD (chronic obstructive pulmonary disease)   Chronic pain   Bipolar disorder   H/O ETOH abuse   Encephalopathy acute     Medication List    STOP taking these medications        buPROPion 150 MG 24 hr tablet  Commonly known as:  WELLBUTRIN XL     doxycycline 100 MG capsule  Commonly known as:  VIBRAMYCIN      TAKE these medications        DULoxetine 60 MG capsule  Commonly known as:  CYMBALTA  Take 60 mg by mouth 2 (two) times daily.     fentaNYL 100 MCG/HR  Commonly known as:  DURAGESIC - dosed mcg/hr  Place 1 patch (100 mcg total) onto the skin every other day. This dose was verified, it is correct     lamoTRIgine 100 MG tablet  Commonly known as:  LAMICTAL  Take 1 tablet (100 mg total) by mouth 2 (two) times daily.     levothyroxine 88 MCG tablet  Commonly known as:  SYNTHROID, LEVOTHROID  Take 88 mcg by mouth daily.     lithium carbonate 300 MG capsule  Take 1 capsule (300 mg total) by mouth 2 (two) times daily with a meal.     methylPREDNISolone 4 MG Tbpk tablet  Commonly known as:  MEDROL DOSEPAK  Take 4-24 mg by mouth See admin instructions. TAKE AS DIRECTED PER PACKAGE INSTRUCTIONS STARTING ON 11/08/2014 (6,5,4,3,2,1)     multivitamin with minerals Tabs tablet  Take 1 tablet by mouth daily.     Oxycodone HCl 10 MG Tabs  Take 10 mg by mouth 5 (five) times daily as needed (FOR PAIN).     OXYGEN  Inhale 2.5 L into the lungs daily.     promethazine 25 MG tablet  Commonly known as:  PHENERGAN  Take 25 mg by mouth every 6 (six) hours as needed for nausea or vomiting. Nausea and vomiting         Discharged Condition: Improved    Consults: None  Significant Diagnostic Studies: Ct Head Wo Contrast  11/10/2014   CLINICAL DATA:  Altered consciousness.  Respiratory distress.  EXAM: CT HEAD WITHOUT CONTRAST  TECHNIQUE: Contiguous axial images were obtained from the base of the skull through the vertex without intravenous contrast.  COMPARISON:  11/09/2013; 08/30/2012  FINDINGS: Similar findings of advanced atrophy with diffuse sulcal prominence. Gray-white differentiation is maintained without CT evidence of acute large territory infarct. No intraparenchymal or extra-axial mass or hemorrhage. Normal size and configuration of the ventricles and basilar cisterns. No midline shift. Limited visualization the paranasal sinuses and mastoid air cells is normal. No air-fluid levels. Regional soft tissues appear normal.  IMPRESSION: Age advanced atrophy without acute intracranial process.   Electronically Signed   By: Sandi Mariscal M.D.   On: 11/10/2014 18:12   Dg Chest Port 1 View  11/10/2014   CLINICAL DATA:  Respiratory distress  EXAM: PORTABLE CHEST - 1 VIEW  COMPARISON:  11/03/2014  FINDINGS: The heart size and mediastinal contours are within normal limits. Both lungs are clear. The visualized skeletal structures are unremarkable.  IMPRESSION: No active  disease.   Electronically Signed   By: Kathreen Devoid   On: 11/10/2014 18:36    Lab Results: Basic Metabolic Panel:  Recent Labs  11/10/14 1715 11/10/14 1828 11/11/14 0441  NA 141 141 144  K 3.7 3.6 3.5  CL 104 101 109  CO2 28  --  30  GLUCOSE 115* 114* 93  BUN 8 5* 7  CREATININE 0.84 0.80 0.80  CALCIUM 9.5  --  8.7*  MG 2.1  --   --    Liver Function Tests:  Recent Labs  11/10/14 1715  AST 41  ALT 39  ALKPHOS 78  BILITOT 0.5  PROT 6.9  ALBUMIN 3.6     CBC:  Recent Labs  11/10/14 1715 11/10/14 1828 11/11/14 0441  WBC 10.0  --  8.3  NEUTROABS 7.5  --   --   HGB 14.1 15.6 12.7*  HCT 42.8 46.0 39.3  MCV  96.8  --  98.3  PLT 480*  --  440*    Recent Results (from the past 240 hour(s))  MRSA PCR Screening     Status: None   Collection Time: 11/10/14  9:20 PM  Result Value Ref Range Status   MRSA by PCR NEGATIVE NEGATIVE Final    Comment:        The GeneXpert MRSA Assay (FDA approved for NASAL specimens only), is one component of a comprehensive MRSA colonization surveillance program. It is not intended to diagnose MRSA infection nor to guide or monitor treatment for MRSA infections.   Clostridium Difficile by PCR     Status: None   Collection Time: 11/11/14  1:30 PM  Result Value Ref Range Status   C difficile by pcr NEGATIVE NEGATIVE Final     Hospital Course: This is a 55 year old who has multiple medical problems including chronic pain, bipolar disease, COPD. He had been in his usual state of poor health at home had a grand mal seizure about 10 days ago and was admitted to [facility] in [facility] He was treated there and released. Once he got home he decided to take himself off of his pain medication in about 3 or 4 days later became confused and agitated. He was brought to the emergency department here and treated. He is much improved after receiving treatment in the ER. His delirium has resolved. He is interested in coming off of his pain medications and he is concerned about taking lithium because of the toxicity. By the time of discharge she was back at baseline  Discharge Exam: Blood pressure 139/88, pulse 94, temperature 98.1 F (36.7 C), temperature source Oral, resp. rate 20, height 5\' 7"  (1.702 m), weight 64.4 kg (141 lb 15.6 oz), SpO2 97 %. He is awake and alert. Chest is clear. Heart is regular.  Disposition: Home he is going to decrease his fentanyl patch and will decrease his lithium. He will start lamotrigine and eventually will wean off the patch and off lithium.      Discharge Instructions    Discharge patient    Complete by:  As directed               Signed: Seraphim Trow L   11/12/2014, 10:06 AM

## 2014-11-12 NOTE — Progress Notes (Signed)
Subjective: He feels well. He has no complaints. He is concerned about remaining on lithium.  Objective: Vital signs in last 24 hours: Temp:  [97.4 F (36.3 C)-98.2 F (36.8 C)] 98.1 F (36.7 C) (05/21 0527) Pulse Rate:  [94-107] 94 (05/21 0527) Resp:  [18-20] 20 (05/21 0527) BP: (132-158)/(73-92) 139/88 mmHg (05/21 0527) SpO2:  [97 %-100 %] 97 % (05/21 0527) Weight change:  Last BM Date: 11/11/14  Intake/Output from previous day: 05/20 0701 - 05/21 0700 In: 1020 [P.O.:420; I.V.:600] Out: 2250 [Urine:2250]  PHYSICAL EXAM General appearance: alert, cooperative and no distress Resp: clear to auscultation bilaterally Cardio: regular rate and rhythm, S1, S2 normal, no murmur, click, rub or gallop GI: soft, non-tender; bowel sounds normal; no masses,  no organomegaly Extremities: extremities normal, atraumatic, no cyanosis or edema  Lab Results:  Results for orders placed or performed during the hospital encounter of 11/10/14 (from the past 48 hour(s))  Protime-INR     Status: None   Collection Time: 11/10/14  5:15 PM  Result Value Ref Range   Prothrombin Time 13.0 11.6 - 15.2 seconds   INR 0.96 0.00 - 1.49  CBC with Differential     Status: Abnormal   Collection Time: 11/10/14  5:15 PM  Result Value Ref Range   WBC 10.0 4.0 - 10.5 K/uL   RBC 4.42 4.22 - 5.81 MIL/uL   Hemoglobin 14.1 13.0 - 17.0 g/dL   HCT 42.8 39.0 - 52.0 %   MCV 96.8 78.0 - 100.0 fL   MCH 31.9 26.0 - 34.0 pg   MCHC 32.9 30.0 - 36.0 g/dL   RDW 12.0 11.5 - 15.5 %   Platelets 480 (H) 150 - 400 K/uL   Neutrophils Relative % 75 43 - 77 %   Neutro Abs 7.5 1.7 - 7.7 K/uL   Lymphocytes Relative 14 12 - 46 %   Lymphs Abs 1.4 0.7 - 4.0 K/uL   Monocytes Relative 9 3 - 12 %   Monocytes Absolute 0.9 0.1 - 1.0 K/uL   Eosinophils Relative 1 0 - 5 %   Eosinophils Absolute 0.1 0.0 - 0.7 K/uL   Basophils Relative 1 0 - 1 %   Basophils Absolute 0.1 0.0 - 0.1 K/uL  Salicylate level     Status: None   Collection  Time: 11/10/14  5:15 PM  Result Value Ref Range   Salicylate Lvl <4.0 2.8 - 30.0 mg/dL  Acetaminophen level     Status: Abnormal   Collection Time: 11/10/14  5:15 PM  Result Value Ref Range   Acetaminophen (Tylenol), Serum <10 (L) 10 - 30 ug/mL    Comment:        THERAPEUTIC CONCENTRATIONS VARY SIGNIFICANTLY. A RANGE OF 10-30 ug/mL MAY BE AN EFFECTIVE CONCENTRATION FOR MANY PATIENTS. HOWEVER, SOME ARE BEST TREATED AT CONCENTRATIONS OUTSIDE THIS RANGE. ACETAMINOPHEN CONCENTRATIONS >150 ug/mL AT 4 HOURS AFTER INGESTION AND >50 ug/mL AT 12 HOURS AFTER INGESTION ARE OFTEN ASSOCIATED WITH TOXIC REACTIONS.   Comprehensive metabolic panel     Status: Abnormal   Collection Time: 11/10/14  5:15 PM  Result Value Ref Range   Sodium 141 135 - 145 mmol/L   Potassium 3.7 3.5 - 5.1 mmol/L   Chloride 104 101 - 111 mmol/L   CO2 28 22 - 32 mmol/L   Glucose, Bld 115 (H) 65 - 99 mg/dL   BUN 8 6 - 20 mg/dL   Creatinine, Ser 0.84 0.61 - 1.24 mg/dL   Calcium 9.5 8.9 - 10.3 mg/dL  Total Protein 6.9 6.5 - 8.1 g/dL   Albumin 3.6 3.5 - 5.0 g/dL   AST 41 15 - 41 U/L   ALT 39 17 - 63 U/L   Alkaline Phosphatase 78 38 - 126 U/L   Total Bilirubin 0.5 0.3 - 1.2 mg/dL   GFR calc non Af Amer >60 >60 mL/min   GFR calc Af Amer >60 >60 mL/min    Comment: (NOTE) The eGFR has been calculated using the CKD EPI equation. This calculation has not been validated in all clinical situations. eGFR's persistently <60 mL/min signify possible Chronic Kidney Disease.    Anion gap 9 5 - 15  Ethanol     Status: None   Collection Time: 11/10/14  5:15 PM  Result Value Ref Range   Alcohol, Ethyl (B) <5 <5 mg/dL    Comment:        LOWEST DETECTABLE LIMIT FOR SERUM ALCOHOL IS 11 mg/dL FOR MEDICAL PURPOSES ONLY   TSH     Status: Abnormal   Collection Time: 11/10/14  5:15 PM  Result Value Ref Range   TSH 12.805 (H) 0.350 - 4.500 uIU/mL  Magnesium     Status: None   Collection Time: 11/10/14  5:15 PM  Result  Value Ref Range   Magnesium 2.1 1.7 - 2.4 mg/dL  T4, free     Status: None   Collection Time: 11/10/14  5:15 PM  Result Value Ref Range   Free T4 0.91 0.61 - 1.12 ng/dL    Comment: Performed at [facility]  CBG monitoring, ED     Status: Abnormal   Collection Time: 11/10/14  5:48 PM  Result Value Ref Range   Glucose-Capillary 108 (H) 65 - 99 mg/dL  Blood gas, arterial     Status: Abnormal   Collection Time: 11/10/14  6:22 PM  Result Value Ref Range   O2 Content 2.5 L/min   Delivery systems NASAL CANNULA    pH, Arterial 7.435 7.350 - 7.450   pCO2 arterial 44.2 35.0 - 45.0 mmHg   pO2, Arterial 89.5 80.0 - 100.0 mmHg   Bicarbonate 29.2 (H) 20.0 - 24.0 mEq/L   TCO2 25.6 0 - 100 mmol/L   Acid-Base Excess 5.1 (H) 0.0 - 2.0 mmol/L   O2 Saturation 96.6 %   Patient temperature 37.0    Collection site RIGHT RADIAL    Drawn by [phone]    Sample type ARTERIAL    Allens test (pass/fail) PASS PASS  I-stat Chem 8, ED     Status: Abnormal   Collection Time: 11/10/14  6:28 PM  Result Value Ref Range   Sodium 141 135 - 145 mmol/L   Potassium 3.6 3.5 - 5.1 mmol/L   Chloride 101 101 - 111 mmol/L   BUN 5 (L) 6 - 20 mg/dL   Creatinine, Ser 9.49 0.61 - 1.24 mg/dL   Glucose, Bld 971 (H) 65 - 99 mg/dL   Calcium, Ion 8.20 (H) 1.12 - 1.23 mmol/L   TCO2 26 0 - 100 mmol/L   Hemoglobin 15.6 13.0 - 17.0 g/dL   HCT 99.0 68.9 - 34.0 %  I-Stat CG4 Lactic Acid, ED     Status: None   Collection Time: 11/10/14  6:29 PM  Result Value Ref Range   Lactic Acid, Venous 1.74 0.5 - 2.0 mmol/L  Ammonia     Status: Abnormal   Collection Time: 11/10/14  6:46 PM  Result Value Ref Range   Ammonia 7 (L) 9 - 35 umol/L  Lithium level     Status: None   Collection Time: 11/10/14  6:46 PM  Result Value Ref Range   Lithium Lvl 0.67 0.60 - 1.20 mmol/L  MRSA PCR Screening     Status: None   Collection Time: 11/10/14  9:20 PM  Result Value Ref Range   MRSA by PCR NEGATIVE NEGATIVE    Comment:        The  GeneXpert MRSA Assay (FDA approved for NASAL specimens only), is one component of a comprehensive MRSA colonization surveillance program. It is not intended to diagnose MRSA infection nor to guide or monitor treatment for MRSA infections.   Urinalysis, Routine w reflex microscopic     Status: None   Collection Time: 11/11/14  3:15 AM  Result Value Ref Range   Color, Urine YELLOW YELLOW   APPearance CLEAR CLEAR   Specific Gravity, Urine 1.015 1.005 - 1.030   pH 7.5 5.0 - 8.0   Glucose, UA NEGATIVE NEGATIVE mg/dL   Hgb urine dipstick NEGATIVE NEGATIVE   Bilirubin Urine NEGATIVE NEGATIVE   Ketones, ur NEGATIVE NEGATIVE mg/dL   Protein, ur NEGATIVE NEGATIVE mg/dL   Urobilinogen, UA 0.2 0.0 - 1.0 mg/dL   Nitrite NEGATIVE NEGATIVE   Leukocytes, UA NEGATIVE NEGATIVE    Comment: MICROSCOPIC NOT DONE ON URINES WITH NEGATIVE PROTEIN, BLOOD, LEUKOCYTES, NITRITE, OR GLUCOSE <1000 mg/dL.  Urine rapid drug screen (hosp performed)     Status: Abnormal   Collection Time: 11/11/14  3:15 AM  Result Value Ref Range   Opiates NONE DETECTED NONE DETECTED   Cocaine NONE DETECTED NONE DETECTED   Benzodiazepines POSITIVE (A) NONE DETECTED   Amphetamines NONE DETECTED NONE DETECTED   Tetrahydrocannabinol POSITIVE (A) NONE DETECTED   Barbiturates NONE DETECTED NONE DETECTED    Comment:        DRUG SCREEN FOR MEDICAL PURPOSES ONLY.  IF CONFIRMATION IS NEEDED FOR ANY PURPOSE, NOTIFY LAB WITHIN 5 DAYS.        LOWEST DETECTABLE LIMITS FOR URINE DRUG SCREEN Drug Class       Cutoff (ng/mL) Amphetamine      1000 Barbiturate      200 Benzodiazepine   426 Tricyclics       834 Opiates          300 Cocaine          300 THC              50   Basic metabolic panel     Status: Abnormal   Collection Time: 11/11/14  4:41 AM  Result Value Ref Range   Sodium 144 135 - 145 mmol/L   Potassium 3.5 3.5 - 5.1 mmol/L   Chloride 109 101 - 111 mmol/L   CO2 30 22 - 32 mmol/L   Glucose, Bld 93 65 - 99 mg/dL    BUN 7 6 - 20 mg/dL   Creatinine, Ser 0.80 0.61 - 1.24 mg/dL   Calcium 8.7 (L) 8.9 - 10.3 mg/dL   GFR calc non Af Amer >60 >60 mL/min   GFR calc Af Amer >60 >60 mL/min    Comment: (NOTE) The eGFR has been calculated using the CKD EPI equation. This calculation has not been validated in all clinical situations. eGFR's persistently <60 mL/min signify possible Chronic Kidney Disease.    Anion gap 5 5 - 15  CBC     Status: Abnormal   Collection Time: 11/11/14  4:41 AM  Result Value Ref Range   WBC 8.3 4.0 -  10.5 K/uL   RBC 4.00 (L) 4.22 - 5.81 MIL/uL   Hemoglobin 12.7 (L) 13.0 - 17.0 g/dL   HCT 39.3 39.0 - 52.0 %   MCV 98.3 78.0 - 100.0 fL   MCH 31.8 26.0 - 34.0 pg   MCHC 32.3 30.0 - 36.0 g/dL   RDW 12.3 11.5 - 15.5 %   Platelets 440 (H) 150 - 400 K/uL  Protime-INR     Status: None   Collection Time: 11/11/14  4:41 AM  Result Value Ref Range   Prothrombin Time 13.9 11.6 - 15.2 seconds   INR 1.05 0.00 - 1.49  Clostridium Difficile by PCR     Status: None   Collection Time: 11/11/14  1:30 PM  Result Value Ref Range   C difficile by pcr NEGATIVE NEGATIVE    ABGS  Recent Labs  11/10/14 1822 11/10/14 1828  PHART 7.435  --   PO2ART 89.5  --   TCO2 25.6 26  HCO3 29.2*  --    CULTURES Recent Results (from the past 240 hour(s))  MRSA PCR Screening     Status: None   Collection Time: 11/10/14  9:20 PM  Result Value Ref Range Status   MRSA by PCR NEGATIVE NEGATIVE Final    Comment:        The GeneXpert MRSA Assay (FDA approved for NASAL specimens only), is one component of a comprehensive MRSA colonization surveillance program. It is not intended to diagnose MRSA infection nor to guide or monitor treatment for MRSA infections.   Clostridium Difficile by PCR     Status: None   Collection Time: 11/11/14  1:30 PM  Result Value Ref Range Status   C difficile by pcr NEGATIVE NEGATIVE Final   Studies/Results: Ct Head Wo Contrast  11/10/2014   CLINICAL DATA:  Altered  consciousness.  Respiratory distress.  EXAM: CT HEAD WITHOUT CONTRAST  TECHNIQUE: Contiguous axial images were obtained from the base of the skull through the vertex without intravenous contrast.  COMPARISON:  11/09/2013; 08/30/2012  FINDINGS: Similar findings of advanced atrophy with diffuse sulcal prominence. Gray-white differentiation is maintained without CT evidence of acute large territory infarct. No intraparenchymal or extra-axial mass or hemorrhage. Normal size and configuration of the ventricles and basilar cisterns. No midline shift. Limited visualization the paranasal sinuses and mastoid air cells is normal. No air-fluid levels. Regional soft tissues appear normal.  IMPRESSION: Age advanced atrophy without acute intracranial process.   Electronically Signed   By: Sandi Mariscal M.D.   On: 11/10/2014 18:12   Dg Chest Port 1 View  11/10/2014   CLINICAL DATA:  Respiratory distress  EXAM: PORTABLE CHEST - 1 VIEW  COMPARISON:  11/03/2014  FINDINGS: The heart size and mediastinal contours are within normal limits. Both lungs are clear. The visualized skeletal structures are unremarkable.  IMPRESSION: No active disease.   Electronically Signed   By: Kathreen Devoid   On: 11/10/2014 18:36    Medications:  Prior to Admission:  Prescriptions prior to admission  Medication Sig Dispense Refill Last Dose  . buPROPion (WELLBUTRIN XL) 150 MG 24 hr tablet Take 150 mg by mouth every morning.     11/10/2014 at Unknown time  . DULoxetine (CYMBALTA) 60 MG capsule Take 60 mg by mouth 2 (two) times daily.     11/10/2014 at Unknown time  . levothyroxine (SYNTHROID, LEVOTHROID) 88 MCG tablet Take 88 mcg by mouth daily.   11/10/2014 at Unknown time  . methylPREDNISolone (MEDROL DOSEPAK) 4 MG TBPK  tablet Take 4-24 mg by mouth See admin instructions. TAKE AS DIRECTED PER PACKAGE INSTRUCTIONS STARTING ON 11/08/2014 (6,5,4,3,2,1)     . Oxycodone HCl 10 MG TABS Take 10 mg by mouth 5 (five) times daily as needed (FOR PAIN).    11/10/2014 at Unknown time  . OXYGEN Inhale 2.5 L into the lungs daily.   11/10/2014 at Unknown time  . promethazine (PHENERGAN) 25 MG tablet Take 25 mg by mouth every 6 (six) hours as needed for nausea or vomiting. Nausea and vomiting   11/10/2014 at Unknown time  . [DISCONTINUED] fentaNYL (DURAGESIC - DOSED MCG/HR) 100 MCG/HR Place 3 patches onto the skin every other day. This dose was verified, it is correct   Past Week at Unknown time  . [DISCONTINUED] lithium carbonate 300 MG capsule Take 300 mg by mouth 3 (three) times daily with meals.     11/10/2014 at Unknown time  . doxycycline (VIBRAMYCIN) 100 MG capsule Take 1 capsule (100 mg total) by mouth 2 (two) times daily. (Patient not taking: Reported on 11/10/2014) 20 capsule 0 03/31/2014 at Unknown time  . oxyCODONE (ROXICODONE) 5 MG immediate release tablet Take 1 tablet (5 mg total) by mouth every 6 (six) hours as needed for severe pain. (Patient not taking: Reported on 11/10/2014) 10 tablet 0    Scheduled: . DULoxetine  60 mg Oral BID  . enoxaparin (LOVENOX) injection  40 mg Subcutaneous Q24H  . fentaNYL  150 mcg Transdermal Q48H  . folic acid  1 mg Oral Daily  . lamoTRIgine  100 mg Oral BID  . levothyroxine  88 mcg Oral QAC breakfast  . lithium carbonate  300 mg Oral BID WC  . multivitamin with minerals  1 tablet Oral Daily  . thiamine  100 mg Oral Daily   Or  . thiamine  100 mg Intravenous Daily   Continuous:  PTW:SFKCLEXNT, alum & mag hydroxide-simeth, LORazepam **OR** LORazepam, ondansetron **OR** ondansetron (ZOFRAN) IV, oxyCODONE  Assesment: He was admitted with what was thought to be delirium tremens. However he has not been drinking any alcohol for at least 10 days. His situation is that he had a grand mal seizure was admitted to the hospital at [facility]. After he was discharged he's not had any more alcohol. However he decided that he wanted to stop his chronic pain medication as well and then became confused and agitated. I  think he had acute narcotic withdrawal. He is much improved. He has multiple other medical problems including bipolar disorder which complicates his situation he had acute encephalopathy that has improved. Principal Problem:   Delirium tremens Active Problems:   Chronic pancreatitis   Cirrhosis   Hepatitis C   Hypothyroid   COPD (chronic obstructive pulmonary disease)   Chronic pain   Bipolar disorder   H/O ETOH abuse   Encephalopathy acute    Plan: He is ready for discharge. Please see discharge summary for details    LOS: 2 days   Gonzalo Waymire L 11/12/2014, 10:02 AM

## 2014-11-12 NOTE — Discharge Instructions (Signed)
Alcohol Intoxication °Alcohol intoxication occurs when you drink enough alcohol that it affects your ability to function. It can be mild or very severe. Drinking a lot of alcohol in a short time is called binge drinking. This can be very harmful. Drinking alcohol can also be more dangerous if you are taking medicines or other drugs. Some of the effects caused by alcohol may include: °· Loss of coordination. °· Changes in mood and behavior. °· Unclear thinking. °· Trouble talking (slurred speech). °· Throwing up (vomiting). °· Confusion. °· Slowed breathing. °· Twitching and shaking (seizures). °· Loss of consciousness. °HOME CARE °· Do not drive after drinking alcohol. °· Drink enough water and fluids to keep your pee (urine) clear or pale yellow. Avoid caffeine. °· Only take medicine as told by your doctor. °GET HELP IF: °· You throw up (vomit) many times. °· You do not feel better after a few days. °· You frequently have alcohol intoxication. Your doctor can help decide if you should see a substance use treatment counselor. °GET HELP RIGHT AWAY IF: °· You become shaky when you stop drinking. °· You have twitching and shaking. °· You throw up blood. It may look bright red or like coffee grounds. °· You notice blood in your poop (bowel movements). °· You become lightheaded or pass out (faint). °MAKE SURE YOU:  °· Understand these instructions. °· Will watch your condition. °· Will get help right away if you are not doing well or get worse. °Document Released: 11/27/2007 Document Revised: 02/10/2013 Document Reviewed: 11/13/2012 °[employer]® Patient Information ©2015 [employer]. This information is not intended to replace advice given to you by your health care provider. Make sure you discuss any questions you have with your health care provider. ° °

## 2014-11-13 ENCOUNTER — Encounter (HOSPITAL_COMMUNITY): Payer: Self-pay | Admitting: Emergency Medicine

## 2014-11-13 ENCOUNTER — Emergency Department (HOSPITAL_COMMUNITY)
Admission: EM | Admit: 2014-11-13 | Discharge: 2014-11-13 | Disposition: A | Discharge status: Home / Self Care | Payer: MEDICAID | Attending: Emergency Medicine | Admitting: Emergency Medicine

## 2014-11-13 ENCOUNTER — Emergency Department (HOSPITAL_COMMUNITY): Payer: MEDICAID

## 2014-11-13 DIAGNOSIS — Z8719 Personal history of other diseases of the digestive system: Secondary | ICD-10-CM | POA: Diagnosis not present

## 2014-11-13 DIAGNOSIS — Z9981 Dependence on supplemental oxygen: Secondary | ICD-10-CM | POA: Insufficient documentation

## 2014-11-13 DIAGNOSIS — Z79899 Other long term (current) drug therapy: Secondary | ICD-10-CM | POA: Diagnosis not present

## 2014-11-13 DIAGNOSIS — Z8739 Personal history of other diseases of the musculoskeletal system and connective tissue: Secondary | ICD-10-CM | POA: Insufficient documentation

## 2014-11-13 DIAGNOSIS — E039 Hypothyroidism, unspecified: Secondary | ICD-10-CM | POA: Diagnosis not present

## 2014-11-13 DIAGNOSIS — Z87448 Personal history of other diseases of urinary system: Secondary | ICD-10-CM | POA: Insufficient documentation

## 2014-11-13 DIAGNOSIS — Z8619 Personal history of other infectious and parasitic diseases: Secondary | ICD-10-CM | POA: Diagnosis not present

## 2014-11-13 DIAGNOSIS — Z87828 Personal history of other (healed) physical injury and trauma: Secondary | ICD-10-CM | POA: Insufficient documentation

## 2014-11-13 DIAGNOSIS — Z880 Allergy status to penicillin: Secondary | ICD-10-CM | POA: Diagnosis not present

## 2014-11-13 DIAGNOSIS — F419 Anxiety disorder, unspecified: Secondary | ICD-10-CM | POA: Diagnosis not present

## 2014-11-13 DIAGNOSIS — J449 Chronic obstructive pulmonary disease, unspecified: Secondary | ICD-10-CM | POA: Diagnosis not present

## 2014-11-13 DIAGNOSIS — R4182 Altered mental status, unspecified: Secondary | ICD-10-CM | POA: Diagnosis present

## 2014-11-13 DIAGNOSIS — Z720 Tobacco use: Secondary | ICD-10-CM | POA: Diagnosis not present

## 2014-11-13 DIAGNOSIS — F319 Bipolar disorder, unspecified: Secondary | ICD-10-CM | POA: Diagnosis not present

## 2014-11-13 LAB — CBC WITH DIFFERENTIAL/PLATELET
Basophils Absolute: 0.1 10*3/uL (ref 0.0–0.1)
Basophils Relative: 1 % (ref 0–1)
Eosinophils Absolute: 0.6 10*3/uL (ref 0.0–0.7)
Eosinophils Relative: 4 % (ref 0–5)
HCT: 42.8 % (ref 39.0–52.0)
HEMOGLOBIN: 14.2 g/dL (ref 13.0–17.0)
LYMPHS ABS: 4.2 10*3/uL — AB (ref 0.7–4.0)
LYMPHS PCT: 28 % (ref 12–46)
MCH: 32.2 pg (ref 26.0–34.0)
MCHC: 33.2 g/dL (ref 30.0–36.0)
MCV: 97.1 fL (ref 78.0–100.0)
MONO ABS: 1 10*3/uL (ref 0.1–1.0)
Monocytes Relative: 7 % (ref 3–12)
Neutro Abs: 9.1 10*3/uL — ABNORMAL HIGH (ref 1.7–7.7)
Neutrophils Relative %: 60 % (ref 43–77)
Platelets: 480 10*3/uL — ABNORMAL HIGH (ref 150–400)
RBC: 4.41 MIL/uL (ref 4.22–5.81)
RDW: 12.2 % (ref 11.5–15.5)
WBC: 15 10*3/uL — AB (ref 4.0–10.5)

## 2014-11-13 LAB — BASIC METABOLIC PANEL
ANION GAP: 7 (ref 5–15)
BUN: 8 mg/dL (ref 6–20)
CO2: 27 mmol/L (ref 22–32)
Calcium: 8.9 mg/dL (ref 8.9–10.3)
Chloride: 106 mmol/L (ref 101–111)
Creatinine, Ser: 0.87 mg/dL (ref 0.61–1.24)
GFR calc Af Amer: >60 mL/min (ref >60)
GFR calc non Af Amer: >60 mL/min (ref >60)
Glucose, Bld: 101 mg/dL — ABNORMAL HIGH (ref 65–99)
POTASSIUM: 3.6 mmol/L (ref 3.5–5.1)
Sodium: 140 mmol/L (ref 135–145)

## 2014-11-13 LAB — ETHANOL: Alcohol, Ethyl (B): <5 mg/dL (ref <5)

## 2014-11-13 MED ORDER — LORAZEPAM 2 MG/ML IJ SOLN: 1.0000 mg | Freq: Once | INTRAMUSCULAR | Status: AC

## 2014-11-13 MED ORDER — LORAZEPAM 2 MG/ML IJ SOLN
1.0000 mg | Freq: Once | INTRAMUSCULAR | Status: AC
  Administered 2014-11-13: 1 mg via INTRAVENOUS

## 2014-11-13 MED ORDER — LORAZEPAM 1 MG PO TABS: ORAL_TABLET | ORAL | Status: DC

## 2014-11-13 MED ORDER — LORAZEPAM 2 MG/ML IJ SOLN
1.0000 mg | Freq: Once | INTRAMUSCULAR | Status: AC
  Administered 2014-11-13: 1 mg via INTRAVENOUS
  Filled 2014-11-13: qty 1

## 2014-11-13 MED ORDER — LORAZEPAM 2 MG/ML IJ SOLN
INTRAMUSCULAR | Status: AC
  Filled 2014-11-13: qty 1

## 2014-11-13 NOTE — Discharge Instructions (Signed)
Follow up with dr. Luan Pulling this week

## 2014-11-13 NOTE — ED Notes (Signed)
Patient now forming sentences and stating, "I'm sorry"

## 2014-11-13 NOTE — ED Notes (Signed)
Patient able to talk and answering questions at this time. States wants to go home. Denies suicidal or homicidal ideation.

## 2014-11-13 NOTE — ED Provider Notes (Signed)
CSN: [id number]     Arrival date & time 11/13/14  2102 History   First MD Initiated Contact with Patient 11/13/14 2110     This chart was scribed for Milton Ferguson, MD by Forrestine Him, ED Scribe. This patient was seen in room APA02/APA02 and the patient's care was started 9:12 PM.   Chief Complaint  Patient presents with  . Delirium Tremens (DTS)   Patient is a 55 y.o. male presenting with altered mental status. The history is provided by a relative (pt started shaking and having difficulty speaking). No language interpreter was used.  Altered Mental Status Presenting symptoms: no combativeness   Severity:  Moderate Most recent episode:  Today Episode history:  Continuous Timing:  Constant Progression:  Unchanged Chronicity:  Recurrent Context: not dementia   Associated symptoms: abdominal pain    LEVEL 5 CAVEAT DUE TO CONDITION  HPI Comments: [name] is a 54 y.o. male with a PMHx of COPD, hypothyroidism, hepatitis C, GERD, and ETOH abuse who presents to the Emergency Department here for delirium tremens onset 1 hour. Pt was recently discharged from ICU on 5/19 after a 3 day stay for same. Mother states pt was doing well until this evenings episode. Pt is unable to speak and not answering questions at this time. He presents shaking all over and anxious. Mother felt pt was on the verge of seizing so decided to bring him in for evaluation.  Past Medical History  Diagnosis Date  . COPD (chronic obstructive pulmonary disease)   . Hypothyroidism   . BPH (benign prostatic hyperplasia)   . Anxiety   . Depression   . Cirrhosis   . Pancreatitis chronic   . Epicondylitis     hips  . Hepatitis C   . Bipolar disorder   . Oxygen decrease   . Shortness of breath   . GERD (gastroesophageal reflux disease)   . Head injury   . ETOH abuse    Past Surgical History  Procedure Laterality Date  . Total hip arthroplasty  1 year ago    right hip-[place]  . Appendectomy  age 18  .  Femur fracture surgery      otif  . Transurethral resection of prostate  05/23/2011    Procedure: TRANSURETHRAL RESECTION OF THE PROSTATE (TURP);  Surgeon: Marissa Nestle;  Location: [facility];  Service: Urology;  Laterality: N/A;  placement of suprapubic catheter  . Colonoscopy  07/22/2012    Procedure: COLONOSCOPY;  Surgeon: Rogene Houston, MD;  [facility];  Service: Endoscopy;  Laterality: N/A;  225-moved to 200 Ann notified pt  . Fasciotomy Left 08/30/2012    Procedure: FASCIOTOMY;  Surgeon: Johnn Hai, MD;  Location: [facility];  Service: Orthopedics;  Laterality: Left;  . I&d extremity Left 09/03/2012    Procedure: IRRIGATION AND DEBRIDEMENT EXTREMITY;  Surgeon: Johnny Bridge, MD;  Location: [facility];  Service: Orthopedics;  Laterality: Left;  . Skin split graft Left 09/08/2012    Procedure:  SPLIT THICKNESS SKIN GRAFT LEFT LEG ;  Surgeon: Rozanna Box, MD;  Location: [place];  Service: Orthopedics;  Laterality: Left;   Family History  Problem Relation Age of Onset  . Anesthesia problems Neg Hx   . Hypotension Neg Hx   . Pseudochol deficiency Neg Hx   . Malignant hyperthermia Neg Hx    History  Substance Use Topics  . Smoking status: Current Every Day Smoker -- 1.00 packs/day for 30 years  Types: Cigarettes  . Smokeless tobacco: Not on file  . Alcohol Use: Yes     Comment: quit drinking this month. Had been drinking "alot"    Review of Systems  Unable to perform ROS: Mental status change  Gastrointestinal: Positive for abdominal pain.      Allergies  Penicillins; Vancomycin; Codeine; and Tylenol  Home Medications   Prior to Admission medications   Medication Sig Start Date End Date Taking? Authorizing Provider  DULoxetine (CYMBALTA) 60 MG capsule Take 60 mg by mouth 2 (two) times daily.      Historical Provider, MD  fentaNYL (DURAGESIC - DOSED MCG/HR) 100 MCG/HR Place 1 patch (100 mcg total) onto the skin every other day. This dose was verified, it is  correct 11/12/14   Sinda Du, MD  lamoTRIgine (LAMICTAL) 100 MG tablet Take 1 tablet (100 mg total) by mouth 2 (two) times daily. 11/12/14   Sinda Du, MD  levothyroxine (SYNTHROID, LEVOTHROID) 88 MCG tablet Take 88 mcg by mouth daily.    Historical Provider, MD  lithium carbonate 300 MG capsule Take 1 capsule (300 mg total) by mouth 2 (two) times daily with a meal. 11/12/14   Sinda Du, MD  methylPREDNISolone (MEDROL DOSEPAK) 4 MG TBPK tablet Take 4-24 mg by mouth See admin instructions. TAKE AS DIRECTED PER PACKAGE INSTRUCTIONS STARTING ON 11/08/2014 (6,5,4,3,2,1)    Historical Provider, MD  Multiple Vitamin (MULTIVITAMIN WITH MINERALS) TABS tablet Take 1 tablet by mouth daily. 11/12/14   Sinda Du, MD  Oxycodone HCl 10 MG TABS Take 10 mg by mouth 5 (five) times daily as needed (FOR PAIN).    Historical Provider, MD  OXYGEN Inhale 2.5 L into the lungs daily.    Historical Provider, MD  promethazine (PHENERGAN) 25 MG tablet Take 25 mg by mouth every 6 (six) hours as needed for nausea or vomiting. Nausea and vomiting    Historical Provider, MD   Triage Vitals: BP 205/126 mmHg  Pulse 109  Temp(Src) 98.5 F (36.9 C)  Resp 24  Ht 5\' 6"  (1.676 m)  Wt 141 lb (63.957 kg)  BMI 22.77 kg/m2  SpO2 94%   Physical Exam  Constitutional: He is oriented to person, place, and time. He appears well-developed.  Pt shaking all over and unable to answer questions  HENT:  Head: Normocephalic.  Eyes: Conjunctivae and EOM are normal. No scleral icterus.  Neck: Neck supple. No thyromegaly present.  Cardiovascular: Normal rate and regular rhythm.  Exam reveals no gallop and no friction rub.   No murmur heard. Pulmonary/Chest: No stridor. He has no wheezes. He has no rales. He exhibits no tenderness.  Abdominal: He exhibits no distension. There is no tenderness. There is no rebound.  Musculoskeletal: Normal range of motion. He exhibits no edema.  Lymphadenopathy:    He has no cervical  adenopathy.  Neurological: He is alert and oriented to person, place, and time. He exhibits normal muscle tone. Coordination normal.  Skin: No rash noted. No erythema.  Psychiatric: His mood appears anxious.    ED Course  Procedures (including critical care time)  DIAGNOSTIC STUDIES: Oxygen Saturation is 94% on RA, adequate by my interpretation.    COORDINATION OF CARE: 9:15 PM- Will give Ativan. Will order CXR, CBC, BMP, EKG, and ethanol. Discussed treatment plan with pt at bedside and pt agreed to plan.     Labs Review Labs Reviewed - No data to display  Imaging Review No results found.   EKG Interpretation None  MDM   Final diagnoses:  None   Anxiety,  Pt improved with ativan,  Pt did not want to be admitted.  rx ativan and follow up with pcp this week.  Milton Ferguson, MD 11/13/14 [phone]
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
## 2014-11-13 NOTE — ED Notes (Signed)
Pt was discharged from ICU, mother state pt was fine until an hour ago he felt like he was going to have a seizure, and could not talk, ask mother to bring to ED, pt talking at present

## 2015-02-22 ENCOUNTER — Ambulatory Visit (INDEPENDENT_AMBULATORY_CARE_PROVIDER_SITE_OTHER): Payer: Medicaid Other | Admitting: Internal Medicine

## 2015-07-14 ENCOUNTER — Ambulatory Visit (HOSPITAL_COMMUNITY)
Admission: RE | Admit: 2015-07-14 | Discharge: 2015-07-14 | Disposition: A | Discharge status: Home / Self Care | Payer: Medicaid Other | Source: Ambulatory Visit | Attending: Pulmonary Disease | Admitting: Pulmonary Disease

## 2015-07-14 ENCOUNTER — Other Ambulatory Visit (HOSPITAL_COMMUNITY): Payer: Self-pay | Admitting: Pulmonary Disease

## 2015-07-14 DIAGNOSIS — J440 Chronic obstructive pulmonary disease with acute lower respiratory infection: Secondary | ICD-10-CM | POA: Diagnosis not present

## 2015-07-14 DIAGNOSIS — J209 Acute bronchitis, unspecified: Secondary | ICD-10-CM | POA: Diagnosis present

## 2015-10-10 ENCOUNTER — Other Ambulatory Visit (HOSPITAL_COMMUNITY): Payer: Self-pay | Admitting: Pulmonary Disease

## 2015-10-10 ENCOUNTER — Ambulatory Visit (HOSPITAL_COMMUNITY)
Admission: RE | Admit: 2015-10-10 | Discharge: 2015-10-10 | Disposition: A | Discharge status: Home / Self Care | Payer: Medicaid Other | Source: Ambulatory Visit | Attending: Pulmonary Disease | Admitting: Pulmonary Disease

## 2015-10-10 DIAGNOSIS — R0781 Pleurodynia: Secondary | ICD-10-CM

## 2015-10-10 DIAGNOSIS — J449 Chronic obstructive pulmonary disease, unspecified: Secondary | ICD-10-CM | POA: Insufficient documentation

## 2016-04-15 ENCOUNTER — Emergency Department (HOSPITAL_COMMUNITY)
Admission: EM | Admit: 2016-04-15 | Discharge: 2016-04-17 | Disposition: A | Discharge status: Home / Self Care | Payer: Medicaid Other | Attending: Emergency Medicine | Admitting: Emergency Medicine

## 2016-04-15 ENCOUNTER — Encounter (HOSPITAL_COMMUNITY): Payer: Self-pay | Admitting: Emergency Medicine

## 2016-04-15 DIAGNOSIS — F1010 Alcohol abuse, uncomplicated: Secondary | ICD-10-CM | POA: Diagnosis not present

## 2016-04-15 DIAGNOSIS — F918 Other conduct disorders: Secondary | ICD-10-CM | POA: Diagnosis not present

## 2016-04-15 DIAGNOSIS — F1019 Alcohol abuse with unspecified alcohol-induced disorder: Secondary | ICD-10-CM

## 2016-04-15 DIAGNOSIS — F19250 Other psychoactive substance dependence with psychoactive substance-induced psychotic disorder with delusions: Secondary | ICD-10-CM | POA: Diagnosis not present

## 2016-04-15 DIAGNOSIS — Z046 Encounter for general psychiatric examination, requested by authority: Secondary | ICD-10-CM | POA: Diagnosis present

## 2016-04-15 DIAGNOSIS — F17210 Nicotine dependence, cigarettes, uncomplicated: Secondary | ICD-10-CM | POA: Insufficient documentation

## 2016-04-15 DIAGNOSIS — Z79899 Other long term (current) drug therapy: Secondary | ICD-10-CM | POA: Diagnosis not present

## 2016-04-15 DIAGNOSIS — J449 Chronic obstructive pulmonary disease, unspecified: Secondary | ICD-10-CM | POA: Diagnosis not present

## 2016-04-15 DIAGNOSIS — F1994 Other psychoactive substance use, unspecified with psychoactive substance-induced mood disorder: Secondary | ICD-10-CM

## 2016-04-15 DIAGNOSIS — R4689 Other symptoms and signs involving appearance and behavior: Secondary | ICD-10-CM

## 2016-04-15 LAB — CBC
HEMATOCRIT: 50.8 % (ref 39.0–52.0)
HEMOGLOBIN: 17.7 g/dL — AB (ref 13.0–17.0)
MCH: 34.9 pg — AB (ref 26.0–34.0)
MCHC: 34.8 g/dL (ref 30.0–36.0)
MCV: 100.2 fL — ABNORMAL HIGH (ref 78.0–100.0)
Platelets: 280 10*3/uL (ref 150–400)
RBC: 5.07 MIL/uL (ref 4.22–5.81)
RDW: 12.9 % (ref 11.5–15.5)
WBC: 9.5 10*3/uL (ref 4.0–10.5)

## 2016-04-15 LAB — COMPREHENSIVE METABOLIC PANEL
ALBUMIN: 4.2 g/dL (ref 3.5–5.0)
ALK PHOS: 115 U/L (ref 38–126)
ALT: 38 U/L (ref 17–63)
AST: 64 U/L — AB (ref 15–41)
Anion gap: 10 (ref 5–15)
BUN: <5 mg/dL — ABNORMAL LOW (ref 6–20)
CALCIUM: 9.7 mg/dL (ref 8.9–10.3)
CHLORIDE: 105 mmol/L (ref 101–111)
CO2: 27 mmol/L (ref 22–32)
CREATININE: 0.68 mg/dL (ref 0.61–1.24)
GFR calc non Af Amer: >60 mL/min (ref >60)
GLUCOSE: 93 mg/dL (ref 65–99)
Potassium: 4.4 mmol/L (ref 3.5–5.1)
Sodium: 142 mmol/L (ref 135–145)
Total Bilirubin: 0.9 mg/dL (ref 0.3–1.2)
Total Protein: 7.7 g/dL (ref 6.5–8.1)

## 2016-04-15 LAB — ACETAMINOPHEN LEVEL: Acetaminophen (Tylenol), Serum: <10 ug/mL — ABNORMAL LOW (ref 10–30)

## 2016-04-15 LAB — ETHANOL: Alcohol, Ethyl (B): 220 mg/dL — ABNORMAL HIGH (ref <5)

## 2016-04-15 LAB — RAPID URINE DRUG SCREEN, HOSP PERFORMED
Amphetamines: NOT DETECTED
BARBITURATES: NOT DETECTED
Benzodiazepines: POSITIVE — AB
COCAINE: NOT DETECTED
Opiates: NOT DETECTED
TETRAHYDROCANNABINOL: POSITIVE — AB

## 2016-04-15 LAB — LITHIUM LEVEL: Lithium Lvl: <0.06 mmol/L — ABNORMAL LOW (ref 0.60–1.20)

## 2016-04-15 LAB — SALICYLATE LEVEL: Salicylate Lvl: <7 mg/dL (ref 2.8–30.0)

## 2016-04-15 MED ORDER — VITAMIN B-1 100 MG PO TABS
100.0000 mg | ORAL_TABLET | Freq: Every day | ORAL | Status: DC
  Administered 2016-04-15 – 2016-04-17 (×3): 100 mg via ORAL
  Filled 2016-04-15 (×3): qty 1

## 2016-04-15 MED ORDER — FENTANYL 50 MCG/HR TD PT72
100.0000 ug | MEDICATED_PATCH | TRANSDERMAL | Status: DC
  Administered 2016-04-15: 100 ug via TRANSDERMAL
  Filled 2016-04-15: qty 2

## 2016-04-15 MED ORDER — DULOXETINE HCL 30 MG PO CPEP
60.0000 mg | ORAL_CAPSULE | Freq: Two times a day (BID) | ORAL | Status: DC
  Administered 2016-04-15 – 2016-04-17 (×4): 60 mg via ORAL
  Filled 2016-04-15 (×4): qty 2

## 2016-04-15 MED ORDER — DIPHENHYDRAMINE HCL 25 MG PO CAPS
25.0000 mg | ORAL_CAPSULE | Freq: Once | ORAL | Status: AC
  Administered 2016-04-16: 25 mg via ORAL
  Filled 2016-04-15: qty 1

## 2016-04-15 MED ORDER — LEVOTHYROXINE SODIUM 88 MCG PO TABS
ORAL_TABLET | ORAL | Status: AC
Start: 2016-04-15 — End: 2016-04-15
  Filled 2016-04-15: qty 1

## 2016-04-15 MED ORDER — LORAZEPAM 1 MG PO TABS: 0.0000 mg | ORAL_TABLET | Freq: Two times a day (BID) | ORAL | Status: DC

## 2016-04-15 MED ORDER — TRAZODONE HCL 50 MG PO TABS
50.0000 mg | ORAL_TABLET | Freq: Every day | ORAL | Status: DC
  Administered 2016-04-16 (×2): 50 mg via ORAL
  Filled 2016-04-15 (×2): qty 1

## 2016-04-15 MED ORDER — LITHIUM CARBONATE 300 MG PO CAPS
300.0000 mg | ORAL_CAPSULE | Freq: Two times a day (BID) | ORAL | Status: DC
  Administered 2016-04-16 – 2016-04-17 (×3): 300 mg via ORAL
  Filled 2016-04-15 (×3): qty 1

## 2016-04-15 MED ORDER — THIAMINE HCL 100 MG/ML IJ SOLN: 100.0000 mg | Freq: Every day | INTRAMUSCULAR | Status: DC

## 2016-04-15 MED ORDER — LAMOTRIGINE 25 MG PO TABS
100.0000 mg | ORAL_TABLET | Freq: Two times a day (BID) | ORAL | Status: DC
  Administered 2016-04-15 – 2016-04-17 (×4): 100 mg via ORAL
  Filled 2016-04-15 (×4): qty 4

## 2016-04-15 MED ORDER — LEVOTHYROXINE SODIUM 88 MCG PO TABS
88.0000 ug | ORAL_TABLET | Freq: Every day | ORAL | Status: DC
  Administered 2016-04-16: 88 ug via ORAL
  Filled 2016-04-15 (×2): qty 1

## 2016-04-15 MED ORDER — ADULT MULTIVITAMIN W/MINERALS CH
1.0000 | ORAL_TABLET | Freq: Every day | ORAL | Status: DC
  Administered 2016-04-15 – 2016-04-17 (×3): 1 via ORAL
  Filled 2016-04-15 (×3): qty 1

## 2016-04-15 MED ORDER — LORAZEPAM 1 MG PO TABS
0.0000 mg | ORAL_TABLET | Freq: Four times a day (QID) | ORAL | Status: DC
  Administered 2016-04-16 (×3): 1 mg via ORAL
  Filled 2016-04-15 (×3): qty 1

## 2016-04-15 NOTE — ED Notes (Signed)
AC called for Synthroid.

## 2016-04-15 NOTE — ED Notes (Signed)
Wanded by security 

## 2016-04-15 NOTE — ED Notes (Signed)
Patients belongings collected and placed in locked locker.
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
## 2016-04-16 DIAGNOSIS — F1019 Alcohol abuse with unspecified alcohol-induced disorder: Secondary | ICD-10-CM | POA: Diagnosis not present

## 2016-04-16 DIAGNOSIS — Z79899 Other long term (current) drug therapy: Secondary | ICD-10-CM

## 2016-04-16 DIAGNOSIS — F17210 Nicotine dependence, cigarettes, uncomplicated: Secondary | ICD-10-CM

## 2016-04-16 MED ORDER — ONDANSETRON 8 MG PO TBDP
8.0000 mg | ORAL_TABLET | Freq: Once | ORAL | Status: AC
  Administered 2016-04-16: 8 mg via ORAL

## 2016-04-16 MED ORDER — ONDANSETRON 8 MG PO TBDP
ORAL_TABLET | ORAL | Status: AC
  Filled 2016-04-16: qty 1

## 2016-04-16 MED ORDER — LEVOTHYROXINE SODIUM 88 MCG PO TABS
88.0000 ug | ORAL_TABLET | Freq: Every day | ORAL | Status: DC
  Administered 2016-04-17: 88 ug via ORAL
  Filled 2016-04-16 (×3): qty 1

## 2016-04-16 NOTE — Progress Notes (Addendum)
Referral has been sent to [facility] and [facility] on 04/16/16. Left voicemail and call back number.  Please refer patient to [facility] in the morning on 10/25 after 9:00am. Per intake Juanita, that's when they will take a look at it. Referral was refaxed to [facility], per Gerald Stabs, beds are open and accepting referrals.  At capacity: [facility] has no male beds tonight. [place] [facility] [place], [place] Disposition staff 04/16/2016 11:00 PM

## 2016-04-16 NOTE — Progress Notes (Signed)
Pt reviewed for admission at [facility] via Dr Bary Leriche.  Declined [No appropriate Beds] [facility] K. Nash Shearer, LPC-A, [facility]  Counselor 04/16/2016 3:10 PM

## 2016-04-16 NOTE — Progress Notes (Signed)
Referred pt to the following facilities for inpatient treatment. [facility]- per Claria Dice- per Joanie  Left voicemails with Tildon Husky, and [facility] and will refer if there is bed availability. No bed availability at others contacted.  Sharren Bridge, MSW, LCSW Clinical Social Work, Disposition  04/16/2016 [phone]

## 2016-04-16 NOTE — BH Assessment (Signed)
Assessor spoke with Tiffany from [facility] who reports they do not accept Medicaid for [facility] pt's so the pt would have to pay out of pocket which ranges from $1200-$1500 per day. Follow up needed with the family in order to determine the pt's willingness to pay out of pocket. Note left for AM shift to follow up.  Lind Covert, MSW, Latanya Presser
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
## 2016-04-17 NOTE — ED Notes (Signed)
Pt undergoing TTS at this time 
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
## 2016-04-17 NOTE — ED Notes (Signed)
Notified Marcene Brawn, SW, that pt's daughter plans to pick pt up and take him to her aunt's house.

## 2016-04-17 NOTE — ED Provider Notes (Signed)
Please see previous physicians note regarding patient's presenting history and physical, initial ED course, and associated medical decision making.  Patient observed in ED and re-evaluated by TTS. At this time, he is sober and appropriate. Denies SI/HI, AVH, and not responding or internal stimuli. Is answering questions appropriately. No concern for psychosis. He is felt to be safe for discharge by TTS and myself. IVC is rescinded. Strict return and follow-up instructions reviewed. He expressed understanding of all discharge instructions and felt comfortable with the plan of care.  Diagnosis: substance induced mood disorder    Forde Dandy, MD 04/17/16 1433

## 2016-04-17 NOTE — ED Notes (Signed)
[name], daughter, states she will be here after 5 pm to get him. States he will be staying with his sister, [name].

## 2016-04-17 NOTE — Discharge Instructions (Signed)
Please return for worsening symptoms, including feelings of wanting to hurt self or others, feeling unsafe, or any other symptoms concerning to you.
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
## 2016-04-17 NOTE — ED Notes (Signed)
Pt sleeping at this time.

## 2016-04-17 NOTE — ED Notes (Signed)
MD at bedside. 

## 2016-04-17 NOTE — ED Notes (Signed)
Pt ambulatory to the bathroom. No difficulties noted. Pt using cane as assist to walk.

## 2016-04-17 NOTE — ED Notes (Signed)
Spoke with Caryl Pina, [place] at [facility], and was told pt was cleared by psychiatry and at this point [facility] social work can get involved to help find appropriate placement of pt.  Spoke with Moshe Salisbury, CSW at [facility] and she will talk to pt's daughter to determine disposition.

## 2016-09-14 ENCOUNTER — Emergency Department (HOSPITAL_COMMUNITY): Payer: Medicaid Other

## 2016-09-14 ENCOUNTER — Emergency Department (HOSPITAL_COMMUNITY)
Admission: EM | Admit: 2016-09-14 | Discharge: 2016-09-14 | Disposition: A | Discharge status: Home / Self Care | Payer: Medicaid Other | Attending: Emergency Medicine | Admitting: Emergency Medicine

## 2016-09-14 ENCOUNTER — Encounter (HOSPITAL_COMMUNITY): Payer: Self-pay | Admitting: Emergency Medicine

## 2016-09-14 DIAGNOSIS — Y999 Unspecified external cause status: Secondary | ICD-10-CM | POA: Diagnosis not present

## 2016-09-14 DIAGNOSIS — X58XXXA Exposure to other specified factors, initial encounter: Secondary | ICD-10-CM | POA: Diagnosis not present

## 2016-09-14 DIAGNOSIS — S73034A Other anterior dislocation of right hip, initial encounter: Secondary | ICD-10-CM | POA: Diagnosis not present

## 2016-09-14 DIAGNOSIS — S79911A Unspecified injury of right hip, initial encounter: Secondary | ICD-10-CM | POA: Diagnosis present

## 2016-09-14 DIAGNOSIS — F17210 Nicotine dependence, cigarettes, uncomplicated: Secondary | ICD-10-CM | POA: Diagnosis not present

## 2016-09-14 DIAGNOSIS — Y929 Unspecified place or not applicable: Secondary | ICD-10-CM | POA: Diagnosis not present

## 2016-09-14 DIAGNOSIS — Z79899 Other long term (current) drug therapy: Secondary | ICD-10-CM | POA: Diagnosis not present

## 2016-09-14 DIAGNOSIS — Z96641 Presence of right artificial hip joint: Secondary | ICD-10-CM | POA: Insufficient documentation

## 2016-09-14 DIAGNOSIS — Y939 Activity, unspecified: Secondary | ICD-10-CM | POA: Diagnosis not present

## 2016-09-14 DIAGNOSIS — J449 Chronic obstructive pulmonary disease, unspecified: Secondary | ICD-10-CM | POA: Diagnosis not present

## 2016-09-14 DIAGNOSIS — E039 Hypothyroidism, unspecified: Secondary | ICD-10-CM | POA: Insufficient documentation

## 2016-09-14 LAB — ETHANOL: ALCOHOL ETHYL (B): 128 mg/dL — AB (ref <5)

## 2016-09-14 MED ORDER — FENTANYL CITRATE (PF) 100 MCG/2ML IJ SOLN: 100.0000 ug | Freq: Once | INTRAMUSCULAR | Status: DC

## 2016-09-14 MED ORDER — IBUPROFEN 200 MG PO TABS
600.0000 mg | ORAL_TABLET | Freq: Once | ORAL | Status: AC
  Administered 2016-09-14: 600 mg via ORAL
  Filled 2016-09-14: qty 3

## 2016-09-14 MED ORDER — PROPOFOL 10 MG/ML IV BOLUS
40.0000 mg | Freq: Once | INTRAVENOUS | Status: AC
  Administered 2016-09-14: 40 mg via INTRAVENOUS
  Filled 2016-09-14: qty 20

## 2016-09-14 MED ORDER — FENTANYL CITRATE (PF) 100 MCG/2ML IJ SOLN
50.0000 ug | Freq: Once | INTRAMUSCULAR | Status: AC
  Administered 2016-09-14: 50 ug via INTRAVENOUS
  Filled 2016-09-14: qty 2

## 2016-09-14 NOTE — ED Notes (Signed)
Pt awake, talking. VSS.

## 2016-09-14 NOTE — ED Provider Notes (Signed)
[facility] DEPT Provider Note   CSN: [id number] Arrival date & time: 09/14/16  1827     History   Chief Complaint Chief Complaint  Patient presents with  . Hip Pain    HPI [name] is a 56 y.o. male.  The history is provided by the patient.  Hip Pain  This is a new problem. The current episode started less than 1 hour ago. The problem occurs rarely. The problem has not changed since onset.Pertinent negatives include no chest pain, no abdominal pain and no shortness of breath. The symptoms are aggravated by bending, walking, twisting and standing. Nothing relieves the symptoms. He has tried nothing (given 8 mg morphine by EMS) for the symptoms. The treatment provided no relief.   56 year old male who presents with right hip pain. 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 Per EMS, patient was standing and felt his right hip pop. He tells me he was sitting and felt a pop in the right hip causing him to fall off to the side. Does endorse drinking better 1 hour prior to arrival. No headstrike or LOC. No other injuries. No numbness or weakness.   Past Medical History:  Diagnosis Date  . Anxiety   . Bipolar disorder ([place])   . BPH (benign prostatic hyperplasia)   . Cirrhosis ([place])   . COPD (chronic obstructive pulmonary disease) ([place])   . Depression   . Epicondylitis    hips  . ETOH abuse   . GERD (gastroesophageal reflux disease)   . Head injury   . Hepatitis C   . Hypothyroidism   . Oxygen decrease   . Pancreatitis chronic   . Shortness of breath     Patient Active Problem List   Diagnosis Date Noted  . Alcohol abuse with alcohol-induced disorder ([place]) 04/16/2016  . Acute narcotic withdrawal with delirium ([place]) 11/12/2014  . H/O ETOH abuse 11/10/2014  . Encephalopathy acute 11/10/2014  . Chronic pain 09/10/2012  . Bipolar disorder ([place]) 09/10/2012  . Open wound, L leg 09/10/2012  . Fall 09/01/2012  . Fracture of ribs, right 11 & 12 08/30/2012  .  Closed fracture of transverse process of lumbar vertebra L2 & L3 08/30/2012  . Tibia/fibula fracture 08/30/2012  . Traumatic compartment syndrome of left leg 08/30/2012  . COPD (chronic obstructive pulmonary disease) ([place])   . Anxiety   . Hepatitis C 07/09/2012  . Hypothyroid 07/09/2012  . Constipation 03/17/2012  . Cirrhosis ([place]) 03/17/2012  . Chronic pancreatitis ([place]) 03/14/2008    Past Surgical History:  Procedure Laterality Date  . APPENDECTOMY  age 54  . COLONOSCOPY  07/22/2012   Procedure: COLONOSCOPY;  Surgeon: Rogene Houston, MD;  Location: [facility];  Service: Endoscopy;  Laterality: N/A;  225-moved to 200 Ann notified pt  . FASCIOTOMY Left 08/30/2012   Procedure: FASCIOTOMY;  Surgeon: Johnn Hai, MD;  Location: [facility];  Service: Orthopedics;  Laterality: Left;  . FEMUR FRACTURE SURGERY     otif  . I&D EXTREMITY Left 09/03/2012   Procedure: IRRIGATION AND DEBRIDEMENT EXTREMITY;  Surgeon: Johnny Bridge, MD;  Location: [facility];  Service: Orthopedics;  Laterality: Left;  . SKIN SPLIT GRAFT Left 09/08/2012   Procedure:  SPLIT THICKNESS SKIN GRAFT LEFT LEG ;  Surgeon: Rozanna Box, MD;  Location: [facility];  Service: Orthopedics;  Laterality: Left;  . TOTAL HIP ARTHROPLASTY  1 year ago   right hip-[place]  . TRANSURETHRAL RESECTION OF PROSTATE  05/23/2011   Procedure: TRANSURETHRAL RESECTION OF THE PROSTATE (TURP);  Surgeon: Marissa Nestle;  Location: [facility];  Service: Urology;  Laterality: N/A;  placement of suprapubic catheter       Home Medications    Prior to Admission medications   Medication Sig Start Date End Date Taking? Authorizing Provider  lamoTRIgine (LAMICTAL) 100 MG tablet Take 1 tablet (100 mg total) by mouth 2 (two) times daily. 11/12/14  Yes Sinda Du, MD  levothyroxine (SYNTHROID, LEVOTHROID) 88 MCG tablet Take 88 mcg by mouth daily.   Yes Historical Provider, MD  lithium carbonate 300 MG capsule Take 1 capsule (300 mg total) by mouth 2  (two) times daily with a meal. 11/12/14  Yes Sinda Du, MD  LORazepam (ATIVAN) 1 MG tablet Take one every 6-8 hours as needed for shaking and anxiety Patient taking differently: Take 0.5 mg by mouth 2 (two) times daily.  11/13/14  Yes Milton Ferguson, MD  Oxycodone HCl 10 MG TABS Take 10 mg by mouth 5 (five) times daily as needed (FOR PAIN).   Yes Historical Provider, MD  OXYGEN Inhale 2.5 L into the lungs daily as needed (for breathing).    Yes Historical Provider, MD  fentaNYL (DURAGESIC - DOSED MCG/HR) 100 MCG/HR Place 1 patch (100 mcg total) onto the skin every other day. This dose was verified, it is correct Patient taking differently: Place 100 mcg onto the skin every other day.  11/12/14   Sinda Du, MD    Family History Family History  Problem Relation Age of Onset  . Anesthesia problems Neg Hx   . Hypotension Neg Hx   . Pseudochol deficiency Neg Hx   . Malignant hyperthermia Neg Hx     Social History Social History  Substance Use Topics  . Smoking status: Current Every Day Smoker    Packs/day: 1.00    Years: 30.00    Types: Cigarettes  . Smokeless tobacco: Not on file  . Alcohol use Yes     Comment: quit drinking this month. Had been drinking "alot"     Allergies   Penicillins; Vancomycin; Codeine; and Tylenol [acetaminophen]   Review of Systems Review of Systems  Constitutional: Negative for fever.  HENT: Negative for congestion.   Respiratory: Negative for shortness of breath.   Cardiovascular: Negative for chest pain.  Gastrointestinal: Negative for abdominal pain.  Genitourinary: Negative for difficulty urinating.  Musculoskeletal: Positive for arthralgias (right hip pain).  Skin: Negative for wound.  Neurological: Negative for syncope.  Hematological: Does not bruise/bleed easily.  All other systems reviewed and are negative.    Physical Exam Updated Vital Signs BP (!) 146/87   Pulse 76   Temp 97.9 F (36.6 C) (Oral)   Resp 16   SpO2 100%     Physical Exam Physical Exam  Nursing note and vitals reviewed. Constitutional: Disheveled, malnourished appearing,  non-toxic, and in no acute distress Head: Normocephalic and atraumatic.  Mouth/Throat: Oropharynx is clear and moist.  Neck: Normal range of motion. Neck supple.  Cardiovascular: Normal rate and regular rhythm. +2 DP pulses   Pulmonary/Chest: Effort normal and breath sounds normal.  Abdominal: Soft. There is no tenderness. There is no rebound and no guarding.  Musculoskeletal: Limited range of motion of the right hip due to pain. Hip is rotated inwardly, slightly shortned, tender to palpation laterally Neurological: Alert, no facial droop, fluent speech, sensation to light touch grossly in tact in bilateral lower extremities, full strength large toe dorsi/plantarflexion bilaterally Skin: Skin  is warm and dry.  Psychiatric: Cooperative   ED Treatments / Results  Labs (all labs ordered are listed, but only abnormal results are displayed) Labs Reviewed  ETHANOL - Abnormal; Notable for the following:       Result Value   Alcohol, Ethyl (B) 128 (*)    All other components within normal limits    EKG  EKG Interpretation None       Radiology Dg Hip Port Unilat W Or Wo Pelvis 1 View Right  Result Date: 09/14/2016 CLINICAL DATA:  Post reduction right hip. EXAM: DG HIP (WITH OR WITHOUT PELVIS) 1V PORT RIGHT COMPARISON:  09/14/2016, 02/01/2014, and CT 11/03/2014 FINDINGS: There has been closed reduction of the right hip following dislocation. Patient has had right total hip arthroplasty with constrained liner. The metallic locking ring associated with the liner is dislocated, likely slipped posteriorly or anteriorly. No acute fracture. IMPRESSION: 1. Interval reduction of the femoral head component. 2. Dislocated constrained liner or locking ring of the liner. Electronically Signed   By: Nolon Nations M.D.   On: 09/14/2016 21:44   Dg Hip Unilat W Or Wo Pelvis 2-3 Views  Right  Result Date: 09/14/2016 CLINICAL DATA:  Right hip pain history of arthroplasty 10 years ago EXAM: DG HIP (WITH OR WITHOUT PELVIS) 2-3V RIGHT COMPARISON:  None. FINDINGS: Three views of the right hip submitted. There is superior subluxation of femoral component from acetabular component of right hip prosthesis. IMPRESSION: Superior dislocation of right hip prosthesis femoral component. Electronically Signed   By: Lahoma Crocker M.D.   On: 09/14/2016 19:44    Procedures ORTHOPEDIC INJURY TREATMENT Date/Time: 09/14/2016 9:10 PM Performed by: Brantley Stage DUO Authorized by: Brantley Stage DUO  Consent: Verbal consent obtained. Consent given by: patient Patient understanding: patient states understanding of the procedure being performed Patient consent: the patient's understanding of the procedure matches consent given Procedure consent: procedure consent matches procedure scheduled Relevant documents: relevant documents present and verified Test results: test results available and properly labeled Site marked: the operative site was marked Imaging studies: imaging studies available Patient identity confirmed: verbally with patient and arm band Time out: Immediately prior to procedure a "time out" was called to verify the correct patient, procedure, equipment, support staff and site/side marked as required. Injury location: hip Location details: right hip Injury type: dislocation Dislocation type: anterior Spontaneous dislocation: yes Prosthesis: yes Pre-procedure neurovascular assessment: neurovascularly intact Pre-procedure distal perfusion: normal Pre-procedure neurological function: normal Pre-procedure range of motion: normal  Anesthesia: Local anesthesia used: no  Sedation: Patient sedated: yes Sedation type: moderate (conscious) sedation Sedatives: propofol Analgesia: fentanyl Vitals: Vital signs were monitored during sedation. Manipulation performed: yes Reduction method:  traction and counter traction Reduction successful: yes X-ray confirmed reduction: yes Immobilization: brace Splint type: knee immbolizer. Post-procedure neurovascular assessment: post-procedure neurovascularly intact Post-procedure distal perfusion: normal Post-procedure neurological function: normal Post-procedure range of motion: normal Patient tolerance: Patient tolerated the procedure well with no immediate complications     Procedural sedation Performed by: Forde Dandy Consent: Verbal consent obtained. Risks and benefits: risks, benefits and alternatives were discussed Required items: required blood products, implants, devices, and special equipment available Patient identity confirmed: arm band and provided demographic data Time out: Immediately prior to procedure a "time out" was called to verify the correct patient, procedure, equipment, support staff and site/side marked as required.  Sedation type: moderate (conscious) sedation NPO time confirmed and considedered  Sedatives: PROPOFOL  Physician Time at Bedside: 30 minutes  Vitals:  Vital signs were monitored during sedation. Cardiac Monitor, pulse oximeter Patient tolerance: Patient tolerated the procedure well with no immediate complications. Comments: Pt with uneventful recovered. Returned to pre-procedural sedation baseline    (including critical care time)  Medications Ordered in ED Medications  fentaNYL (SUBLIMAZE) injection 50 mcg (50 mcg Intravenous Given 09/14/16 1909)  propofol (DIPRIVAN) 10 mg/mL bolus/IV push 40 mg (40 mg Intravenous Given 09/14/16 2052)     Initial Impression / Assessment and Plan / ED Course  I have reviewed the triage vital signs and the nursing notes.  Pertinent labs & imaging results that were available during my care of the patient were reviewed by me and considered in my medical decision making (see chart for details).     Right hip arthoplasty 10 years ago with spontaneous  right hip dislocation. XR hip visualized with anterior superior dislocation. Did endorse alcohol with etoh level of 128. Observed until clinically sober. Sedation and reduction performed. X-ray with successful reduction. To follow-up with Dr. Wynelle Link who performed his original surgery. Observed in ED until fully awake and appropriate.   Final Clinical Impressions(s) / ED Diagnoses   Final diagnoses:  Anterior dislocation of right hip, initial encounter [facility])    New Prescriptions New Prescriptions   No medications on file     Forde Dandy, MD 09/14/16 2220

## 2016-09-14 NOTE — ED Notes (Signed)
ED Provider at bedside. Dr. Oleta Mouse at bedside.

## 2016-09-14 NOTE — ED Notes (Signed)
Portable xray at bedside.

## 2016-09-14 NOTE — ED Notes (Signed)
Patient transported to X-ray 

## 2016-09-14 NOTE — Discharge Instructions (Signed)
Call Dr. Wynelle Link for follow-up regarding need for hip revision.  Please return for worsening symptoms, including recurrent dislocation of the hip

## 2016-09-14 NOTE — ED Triage Notes (Signed)
Pt BIB EMS. Pt is from home. He states he was standing and felt his R hip pop. He has a hx of R hip replacement. EMS notes R hip shortening and rotation. Pt had morphine 8 mg PTA by EMS. Pt rates pain 10/10.

## 2016-09-14 NOTE — ED Notes (Signed)
Bed: EZ50 Expected date:  Expected time:  Means of arrival:  Comments: 56 yo hip injury-8 morphine given en route

## 2016-10-15 ENCOUNTER — Ambulatory Visit: Payer: Self-pay | Admitting: Orthopedic Surgery

## 2016-10-15 NOTE — Progress Notes (Signed)
Preop on 4/27.  Need orders in epic.

## 2016-10-16 NOTE — Patient Instructions (Addendum)
[name]  10/16/2016   Your procedure is scheduled on: 10-25-16  Report to [facility] Main  Entrance Report to admitting at 2 PM  Call this number if you have problems the morning of surgery  [phone]   Remember: ONLY 1 PERSON MAY GO WITH YOU TO SHORT STAY TO GET  READY MORNING OF [name].  Do not eat food or drink liquids :After Midnight. After midnight, you may have a clear liquid diet until 10AM. Then nothing by mouth     CLEAR LIQUID DIET   Foods Allowed                                                                     Foods Excluded  Coffee and tea, regular and decaf                             liquids that you cannot  Plain Jell-O in any flavor                                             see through such as: Fruit ices (not with fruit pulp)                                     milk, soups, orange juice  Iced Popsicles                                    All solid food Carbonated beverages, regular and diet                                    Cranberry, grape and apple juices Sports drinks like Gatorade Lightly seasoned clear broth or consume(fat free) Sugar, honey syrup  Sample Menu Breakfast                                Lunch                                     Supper Cranberry juice                    Beef broth                            Chicken broth Jell-O                                     Grape juice  Apple juice Coffee or tea                        Jell-O                                      Popsicle                                                Coffee or tea                        Coffee or tea  _____________________________________________________________________     Take these medicines the morning of surgery with A SIP OF WATER: Levothyroxine (Synthroid), Lithium Carbonate, Oxycodone as needed for pain                               You may not have any metal on your body including hair pins  and              piercings  Do not wear jewelry, make-up, lotions, powders or perfumes, deodorant             Do not wear nail polish.  Do not shave  48 hours prior to surgery.              Men may shave face and neck.   Do not bring valuables to the hospital. [place].  Contacts, dentures or bridgework may not be worn into surgery.  Leave suitcase in the car. After surgery it may be brought to your room.                  Please read over the following fact sheets you were given: _____________________________________________________________________             [facility] - Preparing for Surgery Before surgery, you can play an important role.  Because skin is not sterile, your skin needs to be as free of germs as possible.  You can reduce the number of germs on your skin by washing with CHG (chlorahexidine gluconate) soap before surgery.  CHG is an antiseptic cleaner which kills germs and bonds with the skin to continue killing germs even after washing. Please DO NOT use if you have an allergy to CHG or antibacterial soaps.  If your skin becomes reddened/irritated stop using the CHG and inform your nurse when you arrive at Short Stay. Do not shave (including legs and underarms) for at least 48 hours prior to the first CHG shower.  You may shave your face/neck. Please follow these instructions carefully:  1.  Shower with CHG Soap the night before surgery and the  morning of Surgery.  2.  If you choose to wash your hair, wash your hair first as usual with your  normal  shampoo.  3.  After you shampoo, rinse your hair and body thoroughly to remove the  shampoo.                           4.  Use CHG as you would any other liquid soap.  You can apply chg directly  to the skin and wash                       Gently with a scrungie or clean washcloth.  5.  Apply the CHG Soap to your body ONLY FROM THE NECK DOWN.   Do not use on face/ open                            Wound or open sores. Avoid contact with eyes, ears mouth and genitals (private parts).                       Wash face,  Genitals (private parts) with your normal soap.             6.  Wash thoroughly, paying special attention to the area where your surgery  will be performed.  7.  Thoroughly rinse your body with warm water from the neck down.  8.  DO NOT shower/wash with your normal soap after using and rinsing off  the CHG Soap.                9.  Pat yourself dry with a clean towel.            10.  Wear clean pajamas.            11.  Place clean sheets on your bed the night of your first shower and do not  sleep with pets. Day of Surgery : Do not apply any lotions/deodorants the morning of surgery.  Please wear clean clothes to the hospital/surgery center.  FAILURE TO FOLLOW THESE INSTRUCTIONS MAY RESULT IN THE CANCELLATION OF YOUR SURGERY PATIENT SIGNATURE_________________________________  NURSE SIGNATURE__________________________________  ________________________________________________________________________   [name]  An incentive spirometer is a tool that can help keep your lungs clear and active. This tool measures how well you are filling your lungs with each breath. Taking long deep breaths may help reverse or decrease the chance of developing breathing (pulmonary) problems (especially infection) following:  A long period of time when you are unable to move or be active. BEFORE THE PROCEDURE   If the spirometer includes an indicator to show your best effort, your nurse or respiratory therapist will set it to a desired goal.  If possible, sit up straight or lean slightly forward. Try not to slouch.  Hold the incentive spirometer in an upright position. INSTRUCTIONS FOR USE  1. Sit on the edge of your bed if possible, or sit up as far as you can in bed or on a chair. 2. Hold the incentive spirometer in an upright position. 3. Breathe out  normally. 4. Place the mouthpiece in your mouth and seal your lips tightly around it. 5. Breathe in slowly and as deeply as possible, raising the piston or the ball toward the top of the column. 6. Hold your breath for 3-5 seconds or for as long as possible. Allow the piston or ball to fall to the bottom of the column. 7. Remove the mouthpiece from your mouth and breathe out normally. 8. Rest for a few seconds and repeat Steps 1 through 7 at least 10 times every 1-2 hours when you are awake. Take your time and take a few normal breaths between deep breaths. 9. The spirometer may include an indicator to show your  best effort. Use the indicator as a goal to work toward during each repetition. 10. After each set of 10 deep breaths, practice coughing to be sure your lungs are clear. If you have an incision (the cut made at the time of surgery), support your incision when coughing by placing a pillow or rolled up towels firmly against it. Once you are able to get out of bed, walk around indoors and cough well. You may stop using the incentive spirometer when instructed by your caregiver.  RISKS AND COMPLICATIONS  Take your time so you do not get dizzy or light-headed.  If you are in pain, you may need to take or ask for pain medication before doing incentive spirometry. It is harder to take a deep breath if you are having pain. AFTER USE  Rest and breathe slowly and easily.  It can be helpful to keep track of a log of your progress. Your caregiver can provide you with a simple table to help with this. If you are using the spirometer at home, follow these instructions: [place] IF:   You are having difficultly using the spirometer.  You have trouble using the spirometer as often as instructed.  Your pain medication is not giving enough relief while using the spirometer.  You develop fever of 100.5 F (38.1 C) or higher. SEEK IMMEDIATE MEDICAL CARE IF:   You cough up bloody sputum  that had not been present before.  You develop fever of 102 F (38.9 C) or greater.  You develop worsening pain at or near the incision site. MAKE SURE YOU:   Understand these instructions.  Will watch your condition.  Will get help right away if you are not doing well or get worse. Document Released: 10/21/2006 Document Revised: 09/02/2011 Document Reviewed: 12/22/2006 Exit[employer] Patient Information 2014 [employer], [place].   ________________________________________________________________________  WHAT IS A BLOOD TRANSFUSION? Blood Transfusion Information  A transfusion is the replacement of blood or some of its parts. Blood is made up of multiple cells which provide different functions.  Red blood cells carry oxygen and are used for blood loss replacement.  White blood cells fight against infection.  Platelets control bleeding.  Plasma helps clot blood.  Other blood products are available for specialized needs, such as hemophilia or other clotting disorders. BEFORE THE TRANSFUSION  Who gives blood for transfusions?   Healthy volunteers who are fully evaluated to make sure their blood is safe. This is blood bank blood. Transfusion therapy is the safest it has ever been in the practice of medicine. Before blood is taken from a donor, a complete history is taken to make sure that person has no history of diseases nor engages in risky social behavior (examples are intravenous drug use or sexual activity with multiple partners). The donor's travel history is screened to minimize risk of transmitting infections, such as malaria. The donated blood is tested for signs of infectious diseases, such as HIV and hepatitis. The blood is then tested to be sure it is compatible with you in order to minimize the chance of a transfusion reaction. If you or a relative donates blood, this is often done in anticipation of surgery and is not appropriate for emergency situations. It takes many days to  process the donated blood. RISKS AND COMPLICATIONS Although transfusion therapy is very safe and saves many lives, the main dangers of transfusion include:   Getting an infectious disease.  Developing a transfusion reaction. This is an allergic reaction to something  in the blood you were given. Every precaution is taken to prevent this. The decision to have a blood transfusion has been considered carefully by your caregiver before blood is given. Blood is not given unless the benefits outweigh the risks. AFTER THE TRANSFUSION  Right after receiving a blood transfusion, you will usually feel much better and more energetic. This is especially true if your red blood cells have gotten low (anemic). The transfusion raises the level of the red blood cells which carry oxygen, and this usually causes an energy increase.  The nurse administering the transfusion will monitor you carefully for complications. HOME CARE INSTRUCTIONS  No special instructions are needed after a transfusion. You may find your energy is better. Speak with your caregiver about any limitations on activity for underlying diseases you may have. SEEK MEDICAL CARE IF:   Your condition is not improving after your transfusion.  You develop redness or irritation at the intravenous (IV) site. SEEK IMMEDIATE MEDICAL CARE IF:  Any of the following symptoms occur over the next 12 hours:  Shaking chills.  You have a temperature by mouth above 102 F (38.9 C), not controlled by medicine.  Chest, back, or muscle pain.  People around you feel you are not acting correctly or are confused.  Shortness of breath or difficulty breathing.  Dizziness and fainting.  You get a rash or develop hives.  You have a decrease in urine output.  Your urine turns a dark color or changes to pink, red, or brown. Any of the following symptoms occur over the next 10 days:  You have a temperature by mouth above 102 F (38.9 C), not controlled by  medicine.  Shortness of breath.  Weakness after normal activity.  The white part of the eye turns yellow (jaundice).  You have a decrease in the amount of urine or are urinating less often.  Your urine turns a dark color or changes to pink, red, or brown. Document Released: 06/07/2000 Document Revised: 09/02/2011 Document Reviewed: 01/25/2008 [facility] Patient Information 2014 [place], [place].  _______________________________________________________________________

## 2016-10-18 ENCOUNTER — Encounter (HOSPITAL_COMMUNITY): Payer: Self-pay

## 2016-10-18 ENCOUNTER — Encounter (HOSPITAL_COMMUNITY)
Admission: RE | Admit: 2016-10-18 | Discharge: 2016-10-18 | Disposition: A | Discharge status: Home / Self Care | Payer: Medicaid Other | Source: Ambulatory Visit | Attending: Orthopedic Surgery | Admitting: Orthopedic Surgery

## 2016-10-18 DIAGNOSIS — Z01812 Encounter for preprocedural laboratory examination: Secondary | ICD-10-CM | POA: Diagnosis present

## 2016-10-18 DIAGNOSIS — T84020A Dislocation of internal right hip prosthesis, initial encounter: Secondary | ICD-10-CM | POA: Insufficient documentation

## 2016-10-18 LAB — COMPREHENSIVE METABOLIC PANEL
ALK PHOS: 81 U/L (ref 38–126)
ALT: 13 U/L — AB (ref 17–63)
AST: 21 U/L (ref 15–41)
Albumin: 3.6 g/dL (ref 3.5–5.0)
Anion gap: 9 (ref 5–15)
BILIRUBIN TOTAL: 0.4 mg/dL (ref 0.3–1.2)
BUN: 6 mg/dL (ref 6–20)
CALCIUM: 9.8 mg/dL (ref 8.9–10.3)
CHLORIDE: 106 mmol/L (ref 101–111)
CO2: 29 mmol/L (ref 22–32)
Creatinine, Ser: 0.77 mg/dL (ref 0.61–1.24)
GLUCOSE: 75 mg/dL (ref 65–99)
Potassium: 4.1 mmol/L (ref 3.5–5.1)
Sodium: 144 mmol/L (ref 135–145)
TOTAL PROTEIN: 7.3 g/dL (ref 6.5–8.1)

## 2016-10-18 LAB — CBC
HEMATOCRIT: 43.5 % (ref 39.0–52.0)
Hemoglobin: 14.3 g/dL (ref 13.0–17.0)
MCH: 33.3 pg (ref 26.0–34.0)
MCHC: 32.9 g/dL (ref 30.0–36.0)
MCV: 101.2 fL — AB (ref 78.0–100.0)
PLATELETS: 375 10*3/uL (ref 150–400)
RBC: 4.3 MIL/uL (ref 4.22–5.81)
RDW: 12.3 % (ref 11.5–15.5)
WBC: 8.7 10*3/uL (ref 4.0–10.5)

## 2016-10-18 LAB — SURGICAL PCR SCREEN
MRSA, PCR: NEGATIVE
STAPHYLOCOCCUS AUREUS: POSITIVE — AB

## 2016-10-18 LAB — PROTIME-INR
INR: 0.95
Prothrombin Time: 12.6 seconds (ref 11.4–15.2)

## 2016-10-18 LAB — APTT: aPTT: 31 seconds (ref 24–36)

## 2016-10-24 ENCOUNTER — Ambulatory Visit: Payer: Self-pay | Admitting: Orthopedic Surgery

## 2016-10-24 NOTE — H&P (Signed)
[name] DOB: [birth year] Single / Language: English / Race: White Male Date of Admission:  10/25/2016 CC: right hip dislocation History of Present Illness The patient is a 56 year old male who comes in for a preoperative History and Physical. The patient is scheduled for a right total hip arthroplasty (revision constrained liner) to be performed by Dr. Dione Plover. Aluisio, MD at [facility] on 10-25-2016. The patient is a 56 year old male who presented with a hip problem. The symptoms began following a specific injury (states he was standing and felt a pop in his hip). Symptoms reported include hip pain and difficulty ambulating. The symptoms are described as severe.The patient feels as if their symptoms are notes that they are unchanged. Current treatment includes restricted activity (wearing knee immobilizer) and opioid analgesics (Fentanyl and Oxycodone). He states that his pain management physician plans to reduce the Fentanyl patch to 75mg ; and the Oxycodone to 5mg  at the end of the month. He is concerned that this will not be enough to manage his pain due to the right hip). Pertinent medical history includes total hip replacement (revision 08/21/07). Prior to being seen, the patient was previously evaluated in the emergency room. Previous workup for this problem has included hip x-rays (hip was dislocated and reduced in the ER). He did have a hip dislocation about 3 weeks ago. He is not quite sure about the circumstances surrounding it. He had a reduction in the ER. He has been wearing his knee immobilizer nonstop. He had a posterior dislocation. It is reduced in follow up at the office, but the locking ring is displaced. This is going to need to be revised. This has been about 10 years, so that is a fairly decent lifespan for a constrained liner. We will put in a new constrained in. He is subsequently admitted for revision of the constrained liner. They have been treated in the past  for the above stated problem with a constrained liner procedure. They have progressive pain and severe functional limitations and dysfunction since the dislocation. They have failed non-operative management including home exercise, medications, and bracing. It is felt that they would benefit from undergoing revision total joint replacement. Risks and benefits of the procedure have been discussed with the patient and they elect to proceed with surgery. There are no active contraindications to surgery such as ongoing infection or rapidly progressive neurological disease.  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 Vancomycin HCl *Anti-infective Agents - Misc.**  Difficulty breathing, Rash. Tylenol *ANALGESICS - NonNarcotic*  Rash.  Family History Cancer  Maternal Grandfather, Sister. Drug / Alcohol Addiction  Father. Hypertension  Mother. Liver Disease, Chronic  Father. Osteoarthritis  Mother.  Social History  Children  2 Current drinker  10/04/2016: Currently drinks beer less than 5 times per week Current work status  disabled Exercise  Exercises never Living situation  live with parents Marital status  single No history of drug/alcohol rehab  Not under pain contract  Number of flights of stairs before winded  less than 1 Tobacco use  Current every day smoker. 10/04/2016: smoke(d) - down to 3/4 PPD at this time  Medication History FentaNYL (75MCG/HR , Transdermal) Active. OxyCODONE HCl (5MG   Tablet, Oral) Active. LORazepam (1MG  Tablet, 1/2 tablet Oral twice a day) Active. LamoTRIgine (100MG  Tablet, Oral) Active. Lithium Carbonate (300MG   Capsule, Oral) Active. Synthroid (88MCG Tablet, Oral) Active.  Past Surgical History Appendectomy  Gallbladder Surgery  laporoscopic Hip Fracture and Surgery  right Total Hip Replacement  right   Review of Systems  General Not Present- Chills, Fatigue, Fever, Memory Loss, Night Sweats, Weight Gain and Weight Loss. Skin Not Present- Eczema, Hives, Itching, Lesions and Rash. HEENT Not Present- Dentures, Double Vision, Headache, Hearing Loss, Tinnitus and Visual Loss. Respiratory Present- Chronic Cough. Not Present- Allergies, Coughing up blood, Shortness of breath at rest and Shortness of breath with exertion. Cardiovascular Not Present- Chest Pain, Difficulty Breathing Lying Down, Murmur, Palpitations, Racing/skipping heartbeats and Swelling. Gastrointestinal Not Present- Abdominal Pain, Bloody Stool, Constipation, Diarrhea, Difficulty Swallowing, Heartburn, Jaundice, Loss of appetitie, Nausea and Vomiting. Male Genitourinary Not Present- Blood in Urine, Discharge, Flank Pain, Incontinence, Painful Urination, Urgency, Urinary frequency, Urinary Retention, Urinating at Night and Weak urinary stream. Musculoskeletal Not Present- Back Pain, Joint Pain, Joint Swelling, Morning Stiffness, Muscle Pain, Muscle Weakness and Spasms. 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 Psychiatric Not Present- Insomnia.  Vitals  Weight: 125 lb Height: 67in Weight was reported by patient. Height was reported by patient. Body Surface Area: 1.66 m Body Mass Index: 19.58 kg/m  Pulse: 108 (Regular)  BP: 146/68 (Sitting, Right Arm, Standard)   Physical Exam General Mental Status -Alert, cooperative and good historian. General Appearance-pleasant, Not in acute distress. Orientation-Oriented X3. Build & Nutrition-Well nourished and Well developed.  Head  and Neck Head-normocephalic, atraumatic . Neck Global Assessment - supple, no bruit auscultated on the right, no bruit auscultated on the left.  Eye Vision-Wears corrective lenses. Pupil - Bilateral-Regular and Round. Motion - Bilateral-EOMI.  ENMT Note: upper and lower dentures   Chest and Lung Exam Auscultation Breath sounds - clear at anterior chest wall and clear at posterior chest wall. Adventitious sounds - End inspiratory coarse crackles - Left Lower Lobe (Posterior).  Cardiovascular Auscultation Rhythm - Regular rate and rhythm. Heart Sounds - S1 WNL and S2 WNL. Murmurs & Other Heart Sounds - Auscultation of the heart reveals - No Murmurs.  Abdomen Palpation/Percussion Tenderness - Abdomen is non-tender to palpation. Rigidity (guarding) - Abdomen is soft. Auscultation Auscultation of the abdomen reveals - Bowel sounds normal.  Male Genitourinary Note: Not done, not pertinent to present illness   Musculoskeletal Note: He is in no distress. His right hip can be flexed to about 100, rotate 20 in each direction, abduct 30 with slight discomfort on range of motion.  I reviewed the x-rays from the ER. He has a locking constrained liner. He had a posterior dislocation. It is reduced, but the locking ring is displaced.  Assessment & Plan Status post right hip replacement (I62.703) Dislocation of right hip, initial encounter (S73.004A)  Note:Surgical Plans: Revision Right Total Hip Constrained Liner  Disposition: Home  PCP: Dr. Sinda Du  IV TXA  Anesthesia Issues: None  Patient was instructed on what medications to stop prior to surgery.  Signed electronically by Joelene Millin, III PA-C

## 2016-10-25 ENCOUNTER — Encounter (HOSPITAL_COMMUNITY): Payer: Self-pay

## 2016-10-25 ENCOUNTER — Ambulatory Visit (HOSPITAL_COMMUNITY): Payer: Medicaid Other | Admitting: Certified Registered Nurse Anesthetist

## 2016-10-25 ENCOUNTER — Inpatient Hospital Stay (HOSPITAL_COMMUNITY): Payer: Medicaid Other

## 2016-10-25 ENCOUNTER — Encounter (HOSPITAL_COMMUNITY)
Admission: RE | Disposition: A | Discharge status: Home Health | Payer: Self-pay | Source: Ambulatory Visit | Attending: Orthopedic Surgery

## 2016-10-25 ENCOUNTER — Inpatient Hospital Stay (HOSPITAL_COMMUNITY)
Admission: RE | Admit: 2016-10-25 | Discharge: 2016-10-27 | DRG: 468 | Disposition: A | Discharge status: Home Health | Payer: Medicaid Other | Source: Ambulatory Visit | Attending: Orthopedic Surgery | Admitting: Orthopedic Surgery

## 2016-10-25 DIAGNOSIS — F319 Bipolar disorder, unspecified: Secondary | ICD-10-CM | POA: Diagnosis present

## 2016-10-25 DIAGNOSIS — T84020A Dislocation of internal right hip prosthesis, initial encounter: Principal | ICD-10-CM | POA: Diagnosis present

## 2016-10-25 DIAGNOSIS — F419 Anxiety disorder, unspecified: Secondary | ICD-10-CM | POA: Diagnosis present

## 2016-10-25 DIAGNOSIS — Y792 Prosthetic and other implants, materials and accessory orthopedic devices associated with adverse incidents: Secondary | ICD-10-CM | POA: Diagnosis present

## 2016-10-25 DIAGNOSIS — F17210 Nicotine dependence, cigarettes, uncomplicated: Secondary | ICD-10-CM | POA: Diagnosis present

## 2016-10-25 DIAGNOSIS — G40909 Epilepsy, unspecified, not intractable, without status epilepticus: Secondary | ICD-10-CM | POA: Diagnosis present

## 2016-10-25 DIAGNOSIS — M25551 Pain in right hip: Secondary | ICD-10-CM | POA: Diagnosis present

## 2016-10-25 DIAGNOSIS — Z79899 Other long term (current) drug therapy: Secondary | ICD-10-CM | POA: Diagnosis not present

## 2016-10-25 DIAGNOSIS — K219 Gastro-esophageal reflux disease without esophagitis: Secondary | ICD-10-CM | POA: Diagnosis present

## 2016-10-25 DIAGNOSIS — N400 Enlarged prostate without lower urinary tract symptoms: Secondary | ICD-10-CM | POA: Diagnosis present

## 2016-10-25 DIAGNOSIS — B1920 Unspecified viral hepatitis C without hepatic coma: Secondary | ICD-10-CM | POA: Diagnosis present

## 2016-10-25 DIAGNOSIS — Z96649 Presence of unspecified artificial hip joint: Secondary | ICD-10-CM

## 2016-10-25 DIAGNOSIS — K7460 Unspecified cirrhosis of liver: Secondary | ICD-10-CM | POA: Diagnosis present

## 2016-10-25 DIAGNOSIS — J449 Chronic obstructive pulmonary disease, unspecified: Secondary | ICD-10-CM | POA: Diagnosis present

## 2016-10-25 DIAGNOSIS — T84018D Broken internal joint prosthesis, other site, subsequent encounter: Secondary | ICD-10-CM

## 2016-10-25 DIAGNOSIS — Z9981 Dependence on supplemental oxygen: Secondary | ICD-10-CM

## 2016-10-25 DIAGNOSIS — T84028A Dislocation of other internal joint prosthesis, initial encounter: Secondary | ICD-10-CM

## 2016-10-25 DIAGNOSIS — E039 Hypothyroidism, unspecified: Secondary | ICD-10-CM | POA: Diagnosis present

## 2016-10-25 HISTORY — PX: TOTAL HIP REVISION: SHX763

## 2016-10-25 LAB — TYPE AND SCREEN
ABO/RH(D): O NEG
Antibody Screen: NEGATIVE

## 2016-10-25 SURGERY — TOTAL HIP REVISION
Anesthesia: Monitor Anesthesia Care | Site: Hip | Laterality: Right

## 2016-10-25 MED ORDER — PROPOFOL 10 MG/ML IV BOLUS
INTRAVENOUS | Status: AC
  Filled 2016-10-25: qty 60

## 2016-10-25 MED ORDER — CLINDAMYCIN PHOSPHATE 600 MG/50ML IV SOLN
600.0000 mg | Freq: Four times a day (QID) | INTRAVENOUS | Status: AC
  Administered 2016-10-26: 05:00:00 600 mg via INTRAVENOUS
  Filled 2016-10-25 (×2): qty 50

## 2016-10-25 MED ORDER — CLINDAMYCIN PHOSPHATE 900 MG/50ML IV SOLN
900.0000 mg | INTRAVENOUS | Status: AC
Start: 2016-10-26 — End: 2016-10-25
  Administered 2016-10-25: 900 mg via INTRAVENOUS

## 2016-10-25 MED ORDER — METHOCARBAMOL 500 MG PO TABS
500.0000 mg | ORAL_TABLET | Freq: Four times a day (QID) | ORAL | Status: DC | PRN
  Administered 2016-10-25 – 2016-10-26 (×2): 500 mg via ORAL
  Filled 2016-10-25 (×4): qty 1

## 2016-10-25 MED ORDER — ONDANSETRON HCL 4 MG/2ML IJ SOLN
INTRAMUSCULAR | Status: DC | PRN
  Administered 2016-10-25: 4 mg via INTRAVENOUS

## 2016-10-25 MED ORDER — BUPIVACAINE HCL (PF) 0.5 % IJ SOLN
INTRAMUSCULAR | Status: DC | PRN
  Administered 2016-10-25: 2.5 mL

## 2016-10-25 MED ORDER — BUPIVACAINE HCL (PF) 0.5 % IJ SOLN
INTRAMUSCULAR | Status: AC
  Filled 2016-10-25: qty 30

## 2016-10-25 MED ORDER — LEVOTHYROXINE SODIUM 88 MCG PO TABS
88.0000 ug | ORAL_TABLET | Freq: Every day | ORAL | Status: DC
  Administered 2016-10-26 – 2016-10-27 (×2): 88 ug via ORAL
  Filled 2016-10-25 (×2): qty 1

## 2016-10-25 MED ORDER — ACETAMINOPHEN 325 MG PO TABS: 650.0000 mg | ORAL_TABLET | Freq: Four times a day (QID) | ORAL | Status: DC | PRN

## 2016-10-25 MED ORDER — HYDROMORPHONE HCL 1 MG/ML IJ SOLN: 0.2500 mg | INTRAMUSCULAR | Status: DC | PRN

## 2016-10-25 MED ORDER — LITHIUM CARBONATE 300 MG PO CAPS
300.0000 mg | ORAL_CAPSULE | Freq: Two times a day (BID) | ORAL | Status: DC
  Administered 2016-10-25 – 2016-10-27 (×4): 300 mg via ORAL
  Filled 2016-10-25 (×5): qty 1

## 2016-10-25 MED ORDER — ONDANSETRON HCL 4 MG/2ML IJ SOLN: 4.0000 mg | Freq: Four times a day (QID) | INTRAMUSCULAR | Status: DC | PRN

## 2016-10-25 MED ORDER — METOCLOPRAMIDE HCL 5 MG/ML IJ SOLN: 5.0000 mg | Freq: Three times a day (TID) | INTRAMUSCULAR | Status: DC | PRN

## 2016-10-25 MED ORDER — PROMETHAZINE HCL 25 MG/ML IJ SOLN: 6.2500 mg | INTRAMUSCULAR | Status: DC | PRN

## 2016-10-25 MED ORDER — FENTANYL 100 MCG/HR TD PT72
100.0000 ug | MEDICATED_PATCH | TRANSDERMAL | Status: DC
  Administered 2016-10-25: 100 ug via TRANSDERMAL
  Filled 2016-10-25: qty 1

## 2016-10-25 MED ORDER — TRANEXAMIC ACID 1000 MG/10ML IV SOLN
2000.0000 mg | Freq: Once | INTRAVENOUS | Status: AC
  Administered 2016-10-25: 2000 mg via TOPICAL
  Filled 2016-10-25: qty 20

## 2016-10-25 MED ORDER — PROPOFOL 500 MG/50ML IV EMUL
INTRAVENOUS | Status: DC | PRN
  Administered 2016-10-25: 20 ug/kg/min via INTRAVENOUS

## 2016-10-25 MED ORDER — PHENYLEPHRINE HCL 10 MG/ML IJ SOLN
INTRAMUSCULAR | Status: AC
  Filled 2016-10-25: qty 1

## 2016-10-25 MED ORDER — ONDANSETRON HCL 4 MG PO TABS: 4.0000 mg | ORAL_TABLET | Freq: Four times a day (QID) | ORAL | Status: DC | PRN

## 2016-10-25 MED ORDER — PHENYLEPHRINE 40 MCG/ML (10ML) SYRINGE FOR IV PUSH (FOR BLOOD PRESSURE SUPPORT)
PREFILLED_SYRINGE | INTRAVENOUS | Status: AC
  Filled 2016-10-25: qty 20

## 2016-10-25 MED ORDER — SODIUM CHLORIDE 0.9 % IR SOLN
Status: DC | PRN
  Administered 2016-10-25: 1000 mL

## 2016-10-25 MED ORDER — METHOCARBAMOL 1000 MG/10ML IJ SOLN
500.0000 mg | Freq: Four times a day (QID) | INTRAVENOUS | Status: DC | PRN
  Administered 2016-10-25: 500 mg via INTRAVENOUS
  Filled 2016-10-25: qty 550

## 2016-10-25 MED ORDER — PHENYLEPHRINE HCL 10 MG/ML IJ SOLN
INTRAMUSCULAR | Status: DC | PRN
  Administered 2016-10-25: 40 ug/min via INTRAVENOUS

## 2016-10-25 MED ORDER — ACETAMINOPHEN 650 MG RE SUPP: 650.0000 mg | Freq: Four times a day (QID) | RECTAL | Status: DC | PRN

## 2016-10-25 MED ORDER — SODIUM CHLORIDE 0.9 % IV SOLN
INTRAVENOUS | Status: DC
Start: 2016-10-25 — End: 2016-10-27
  Administered 2016-10-25: 1000 mL via INTRAVENOUS
  Administered 2016-10-26 (×2): via INTRAVENOUS

## 2016-10-25 MED ORDER — LACTATED RINGERS IV SOLN
INTRAVENOUS | Status: DC
  Administered 2016-10-25: 1000 mL via INTRAVENOUS
  Administered 2016-10-25 (×2): via INTRAVENOUS

## 2016-10-25 MED ORDER — DIPHENHYDRAMINE HCL 12.5 MG/5ML PO ELIX: 12.5000 mg | ORAL_SOLUTION | ORAL | Status: DC | PRN

## 2016-10-25 MED ORDER — PHENYLEPHRINE 40 MCG/ML (10ML) SYRINGE FOR IV PUSH (FOR BLOOD PRESSURE SUPPORT)
PREFILLED_SYRINGE | INTRAVENOUS | Status: DC | PRN
  Administered 2016-10-25 (×5): 80 ug via INTRAVENOUS
  Administered 2016-10-25: 120 ug via INTRAVENOUS
  Administered 2016-10-25: 80 ug via INTRAVENOUS

## 2016-10-25 MED ORDER — DEXAMETHASONE SODIUM PHOSPHATE 10 MG/ML IJ SOLN
10.0000 mg | Freq: Once | INTRAMUSCULAR | Status: AC
  Administered 2016-10-25: 10 mg via INTRAVENOUS

## 2016-10-25 MED ORDER — FENTANYL CITRATE (PF) 100 MCG/2ML IJ SOLN
INTRAMUSCULAR | Status: AC
  Filled 2016-10-25: qty 2

## 2016-10-25 MED ORDER — OXYCODONE HCL 5 MG PO TABS
5.0000 mg | ORAL_TABLET | ORAL | Status: DC | PRN
  Administered 2016-10-25 – 2016-10-26 (×2): 10 mg via ORAL
  Administered 2016-10-26: 20 mg via ORAL
  Administered 2016-10-26: 03:00:00 10 mg via ORAL
  Administered 2016-10-26: 20 mg via ORAL
  Administered 2016-10-26: 10 mg via ORAL
  Administered 2016-10-26 – 2016-10-27 (×4): 20 mg via ORAL
  Filled 2016-10-25: qty 2
  Filled 2016-10-25 (×4): qty 4
  Filled 2016-10-25: qty 2
  Filled 2016-10-25 (×2): qty 4
  Filled 2016-10-25: qty 2
  Filled 2016-10-25: qty 4

## 2016-10-25 MED ORDER — LORAZEPAM 0.5 MG PO TABS
0.5000 mg | ORAL_TABLET | Freq: Two times a day (BID) | ORAL | Status: DC
  Administered 2016-10-25 – 2016-10-27 (×4): 0.5 mg via ORAL
  Filled 2016-10-25 (×4): qty 1

## 2016-10-25 MED ORDER — PROPOFOL 10 MG/ML IV BOLUS
INTRAVENOUS | Status: DC | PRN
  Administered 2016-10-25 (×2): 10 mg via INTRAVENOUS

## 2016-10-25 MED ORDER — LAMOTRIGINE 100 MG PO TABS
100.0000 mg | ORAL_TABLET | Freq: Two times a day (BID) | ORAL | Status: DC
  Administered 2016-10-25 – 2016-10-27 (×4): 100 mg via ORAL
  Filled 2016-10-25 (×4): qty 1

## 2016-10-25 MED ORDER — METOCLOPRAMIDE HCL 5 MG PO TABS: 5.0000 mg | ORAL_TABLET | Freq: Three times a day (TID) | ORAL | Status: DC | PRN

## 2016-10-25 MED ORDER — CHLORHEXIDINE GLUCONATE 4 % EX LIQD: 60.0000 mL | Freq: Once | CUTANEOUS | Status: DC

## 2016-10-25 MED ORDER — CLINDAMYCIN PHOSPHATE 900 MG/50ML IV SOLN
INTRAVENOUS | Status: AC
  Filled 2016-10-25: qty 50

## 2016-10-25 MED ORDER — POLYETHYLENE GLYCOL 3350 17 G PO PACK
17.0000 g | PACK | Freq: Every day | ORAL | Status: DC | PRN
Start: 2016-10-25 — End: 2016-10-27

## 2016-10-25 MED ORDER — ASPIRIN EC 325 MG PO TBEC
325.0000 mg | DELAYED_RELEASE_TABLET | Freq: Every day | ORAL | Status: DC
  Administered 2016-10-26 – 2016-10-27 (×2): 325 mg via ORAL
  Filled 2016-10-25 (×2): qty 1

## 2016-10-25 MED ORDER — DOCUSATE SODIUM 100 MG PO CAPS
100.0000 mg | ORAL_CAPSULE | Freq: Two times a day (BID) | ORAL | Status: DC
  Administered 2016-10-25 – 2016-10-27 (×4): 100 mg via ORAL
  Filled 2016-10-25 (×4): qty 1

## 2016-10-25 MED ORDER — MIDAZOLAM HCL 2 MG/2ML IJ SOLN
INTRAMUSCULAR | Status: AC
  Filled 2016-10-25: qty 2

## 2016-10-25 MED ORDER — FLEET ENEMA 7-19 GM/118ML RE ENEM: 1.0000 | ENEMA | Freq: Once | RECTAL | Status: DC | PRN

## 2016-10-25 MED ORDER — ONDANSETRON HCL 4 MG/2ML IJ SOLN
INTRAMUSCULAR | Status: AC
  Filled 2016-10-25: qty 2

## 2016-10-25 MED ORDER — TRAMADOL HCL 50 MG PO TABS
50.0000 mg | ORAL_TABLET | Freq: Four times a day (QID) | ORAL | Status: DC | PRN
  Administered 2016-10-26: 03:00:00 50 mg via ORAL
  Filled 2016-10-25: qty 1

## 2016-10-25 MED ORDER — DEXAMETHASONE SODIUM PHOSPHATE 10 MG/ML IJ SOLN
10.0000 mg | Freq: Once | INTRAMUSCULAR | Status: AC
  Administered 2016-10-26: 11:00:00 10 mg via INTRAVENOUS
  Filled 2016-10-25: qty 1

## 2016-10-25 MED ORDER — PHENOL 1.4 % MT LIQD
1.0000 | OROMUCOSAL | Status: DC | PRN
  Filled 2016-10-25: qty 177

## 2016-10-25 MED ORDER — MENTHOL 3 MG MT LOZG: 1.0000 | LOZENGE | OROMUCOSAL | Status: DC | PRN

## 2016-10-25 MED ORDER — BISACODYL 10 MG RE SUPP: 10.0000 mg | Freq: Every day | RECTAL | Status: DC | PRN

## 2016-10-25 SURGICAL SUPPLY — 71 items
APL SKNCLS STERI-STRIP NONHPOA (GAUZE/BANDAGES/DRESSINGS) ×1
BAG DECANTER FOR FLEXI CONT (MISCELLANEOUS) ×3 IMPLANT
BAG SPEC THK2 15X12 ZIP CLS (MISCELLANEOUS) ×3
BAG ZIPLOCK 12X15 (MISCELLANEOUS) ×9 IMPLANT
BENZOIN TINCTURE PRP APPL 2/3 (GAUZE/BANDAGES/DRESSINGS) ×2 IMPLANT
BIT DRILL 2.8X128 (BIT) ×2 IMPLANT
BIT DRILL 2.8X128MM (BIT) ×1
BLADE EXTENDED COATED 6.5IN (ELECTRODE) ×3 IMPLANT
BLADE SAW SAG 73X25 THK (BLADE) ×2
BLADE SAW SGTL 73X25 THK (BLADE) ×1 IMPLANT
BRUSH FEMORAL CANAL (MISCELLANEOUS) IMPLANT
COVER SURGICAL LIGHT HANDLE (MISCELLANEOUS) ×3 IMPLANT
DRAPE INCISE IOBAN 66X45 STRL (DRAPES) ×3 IMPLANT
DRAPE ORTHO SPLIT 77X108 STRL (DRAPES) ×6
DRAPE POUCH INSTRU U-SHP 10X18 (DRAPES) ×3 IMPLANT
DRAPE SURG ORHT 6 SPLT 77X108 (DRAPES) ×2 IMPLANT
DRAPE U-SHAPE 47X51 STRL (DRAPES) ×3 IMPLANT
DRSG EMULSION OIL 3X16 NADH (GAUZE/BANDAGES/DRESSINGS) ×3 IMPLANT
DRSG MEPILEX BORDER 4X4 (GAUZE/BANDAGES/DRESSINGS) ×4 IMPLANT
DRSG MEPILEX BORDER 4X8 (GAUZE/BANDAGES/DRESSINGS) ×3 IMPLANT
DURAPREP 26ML APPLICATOR (WOUND CARE) ×3 IMPLANT
ELECT REM PT RETURN 15FT ADLT (MISCELLANEOUS) ×3 IMPLANT
EVACUATOR 1/8 PVC DRAIN (DRAIN) ×3 IMPLANT
FACESHIELD WRAPAROUND (MASK) ×12 IMPLANT
FACESHIELD WRAPAROUND OR TEAM (MASK) ×4 IMPLANT
GAUZE SPONGE 4X4 12PLY STRL (GAUZE/BANDAGES/DRESSINGS) ×3 IMPLANT
GLOVE BIO SURGEON STRL SZ7.5 (GLOVE) ×3 IMPLANT
GLOVE BIO SURGEON STRL SZ8 (GLOVE) ×3 IMPLANT
GLOVE BIOGEL PI IND STRL 8 (GLOVE) ×3 IMPLANT
GLOVE BIOGEL PI INDICATOR 8 (GLOVE) ×6
GLOVE SURG SS PI 6.5 STRL IVOR (GLOVE) ×6 IMPLANT
GOWN STRL REUS W/TWL LRG LVL3 (GOWN DISPOSABLE) ×6 IMPLANT
GOWN STRL REUS W/TWL XL LVL3 (GOWN DISPOSABLE) ×3 IMPLANT
HANDPIECE INTERPULSE COAX TIP (DISPOSABLE)
HEAD FEMORAL SROM 32 PLUS 0 (Hips) IMPLANT
IMMOBILIZER KNEE 20 (SOFTGOODS)
IMMOBILIZER KNEE 20 THIGH 36 (SOFTGOODS) IMPLANT
LINER PINN CONS 32X54 (Hips) IMPLANT
LINER PINNACLE 32X54 (Hips) ×3 IMPLANT
MANIFOLD NEPTUNE II (INSTRUMENTS) ×3 IMPLANT
MARKER SKIN DUAL TIP RULER LAB (MISCELLANEOUS) ×3 IMPLANT
NDL SAFETY ECLIPSE 18X1.5 (NEEDLE) ×1 IMPLANT
NEEDLE HYPO 18GX1.5 SHARP (NEEDLE) ×3
NS IRRIG 1000ML POUR BTL (IV SOLUTION) ×3 IMPLANT
PADDING CAST COTTON 6X4 STRL (CAST SUPPLIES) ×3 IMPLANT
PASSER SUT SWANSON 36MM LOOP (INSTRUMENTS) ×3 IMPLANT
POSITIONER SURGICAL ARM (MISCELLANEOUS) ×3 IMPLANT
PRESSURIZER FEMORAL UNIV (MISCELLANEOUS) IMPLANT
SET HNDPC FAN SPRY TIP SCT (DISPOSABLE) IMPLANT
SPONGE LAP 18X18 X RAY DECT (DISPOSABLE) ×3 IMPLANT
SROM FEMORAL HEAD 32 PLUS 0 (Hips) ×3 IMPLANT
STAPLER VISISTAT 35W (STAPLE) ×3 IMPLANT
SUCTION FRAZIER HANDLE 10FR (MISCELLANEOUS) ×2
SUCTION TUBE FRAZIER 10FR DISP (MISCELLANEOUS) ×1 IMPLANT
SUT ETHIBOND NAB CT1 #1 30IN (SUTURE) ×6 IMPLANT
SUT MNCRL AB 4-0 PS2 18 (SUTURE) ×4 IMPLANT
SUT VIC AB 1 CT1 27 (SUTURE) ×9
SUT VIC AB 1 CT1 27XBRD ANTBC (SUTURE) ×3 IMPLANT
SUT VIC AB 2-0 CT1 27 (SUTURE) ×9
SUT VIC AB 2-0 CT1 TAPERPNT 27 (SUTURE) ×3 IMPLANT
SUT VLOC 180 0 24IN GS25 (SUTURE) ×6 IMPLANT
SWAB COLLECTION DEVICE MRSA (MISCELLANEOUS) ×3 IMPLANT
SWAB CULTURE ESWAB REG 1ML (MISCELLANEOUS) IMPLANT
SYR 50ML LL SCALE MARK (SYRINGE) ×3 IMPLANT
TAPE STRIPS DRAPE STRL (GAUZE/BANDAGES/DRESSINGS) ×2 IMPLANT
TOWEL OR 17X26 10 PK STRL BLUE (TOWEL DISPOSABLE) ×6 IMPLANT
TOWER CARTRIDGE SMART MIX (DISPOSABLE) IMPLANT
TRAY FOLEY W/METER SILVER 16FR (SET/KITS/TRAYS/PACK) ×3 IMPLANT
TUBE KAMVAC SUCTION (TUBING) IMPLANT
WATER STERILE IRR 1500ML POUR (IV SOLUTION) ×3 IMPLANT
YANKAUER SUCT BULB TIP 10FT TU (MISCELLANEOUS) ×3 IMPLANT

## 2016-10-25 NOTE — Interval H&P Note (Signed)
History and Physical Interval Note:  10/25/2016 2:39 PM  [name]  has presented today for surgery, with the diagnosis of Right hip dislocation   The various methods of treatment have been discussed with the patient and family. After consideration of risks, benefits and other options for treatment, the patient has consented to  Procedure(s): Revision right hip constrained liner (Right) as a surgical intervention .  The patient's history has been reviewed, patient examined, no change in status, stable for surgery.  I have reviewed the patient's chart and labs.  Questions were answered to the patient's satisfaction.     [name]

## 2016-10-25 NOTE — Brief Op Note (Signed)
10/25/2016  5:09 PM  PATIENT:  [name]  56 y.o. male  PRE-OPERATIVE DIAGNOSIS:  Right hip dislocation   POST-OPERATIVE DIAGNOSIS:  Right hip dislocation   PROCEDURE:  Procedure(s): Revision right hip constrained liner (Right)  SURGEON:  Surgeon(s) and Role:    * Gaynelle Arabian, MD - Primary  PHYSICIAN ASSISTANT:   ASSISTANTS: Arlee Muslim, PA-C   ANESTHESIA:   spinal  EBL:  Total I/O In: 1000 [I.V.:1000] Out: 800 [Urine:700; Blood:100]  BLOOD ADMINISTERED:none  DRAINS: (Medium) Hemovact drain(s) in the right hip with  Suction Open   LOCAL MEDICATIONS USED:  NONE  COUNTS:  YES  TOURNIQUET:  * No tourniquets in log *  DICTATION: .Other Dictation: Dictation Number [id number]  PLAN OF CARE: Admit to inpatient   PATIENT DISPOSITION:  PACU - hemodynamically stable.

## 2016-10-25 NOTE — Anesthesia Postprocedure Evaluation (Addendum)
Anesthesia Post Note  Patient: [name]  Procedure(s) Performed: Procedure(s) (LRB): Revision right hip constrained liner (Right)  Patient location during evaluation: PACU Anesthesia Type: MAC Level of consciousness: awake and alert Pain management: pain level controlled Vital Signs Assessment: post-procedure vital signs reviewed and stable Respiratory status: spontaneous breathing and respiratory function stable Cardiovascular status: blood pressure returned to baseline and stable Postop Assessment: spinal receding Anesthetic complications: no       Last Vitals:  Vitals:   10/25/16 1815 10/25/16 1825  BP: 127/72 (!) 120/56  Pulse: 87 82  Resp: 19 19  Temp:  36.6 C    Last Pain:  Vitals:   10/25/16 1825  TempSrc:   PainSc: Asleep    LLE Motor Response: Purposeful movement (10/25/16 1825) LLE Sensation: Tingling (10/25/16 1825) RLE Motor Response: Purposeful movement (10/25/16 1825) RLE Sensation: Tingling (10/25/16 1825) L Sensory Level: L3-Anterior knee, lower leg (10/25/16 1825) R Sensory Level: L3-Anterior knee, lower leg (10/25/16 1825)  [name] DANIEL

## 2016-10-25 NOTE — H&P (View-Only) (Signed)
[name] DOB: [birth year] Single / Language: English / Race: White Male Date of Admission:  10/25/2016 CC: right hip dislocation History of Present Illness The patient is a 56 year old male who comes in for a preoperative History and Physical. The patient is scheduled for a right total hip arthroplasty (revision constrained liner) to be performed by Dr. Dione Plover. Aluisio, MD at [facility] on 10-25-2016. The patient is a 56 year old male who presented with a hip problem. The symptoms began following a specific injury (states he was standing and felt a pop in his hip). Symptoms reported include hip pain and difficulty ambulating. The symptoms are described as severe.The patient feels as if their symptoms are notes that they are unchanged. Current treatment includes restricted activity (wearing knee immobilizer) and opioid analgesics (Fentanyl and Oxycodone). He states that his pain management physician plans to reduce the Fentanyl patch to 75mg ; and the Oxycodone to 5mg  at the end of the month. He is concerned that this will not be enough to manage his pain due to the right hip). Pertinent medical history includes total hip replacement (revision 08/21/07). Prior to being seen, the patient was previously evaluated in the emergency room. Previous workup for this problem has included hip x-rays (hip was dislocated and reduced in the ER). He did have a hip dislocation about 3 weeks ago. He is not quite sure about the circumstances surrounding it. He had a reduction in the ER. He has been wearing his knee immobilizer nonstop. He had a posterior dislocation. It is reduced in follow up at the office, but the locking ring is displaced. This is going to need to be revised. This has been about 10 years, so that is a fairly decent lifespan for a constrained liner. We will put in a new constrained in. He is subsequently admitted for revision of the constrained liner. They have been treated in the past  for the above stated problem with a constrained liner procedure. They have progressive pain and severe functional limitations and dysfunction since the dislocation. They have failed non-operative management including home exercise, medications, and bracing. It is felt that they would benefit from undergoing revision total joint replacement. Risks and benefits of the procedure have been discussed with the patient and they elect to proceed with surgery. There are no active contraindications to surgery such as ongoing infection or rapidly progressive neurological disease.  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 Vancomycin HCl *Anti-infective Agents - Misc.**  Difficulty breathing, Rash. Tylenol *ANALGESICS - NonNarcotic*  Rash.  Family History Cancer  Maternal Grandfather, Sister. Drug / Alcohol Addiction  Father. Hypertension  Mother. Liver Disease, Chronic  Father. Osteoarthritis  Mother.  Social History  Children  2 Current drinker  10/04/2016: Currently drinks beer less than 5 times per week Current work status  disabled Exercise  Exercises never Living situation  live with parents Marital status  single No history of drug/alcohol rehab  Not under pain contract  Number of flights of stairs before winded  less than 1 Tobacco use  Current every day smoker. 10/04/2016: smoke(d) - down to 3/4 PPD at this time  Medication History FentaNYL (75MCG/HR , Transdermal) Active. OxyCODONE HCl (5MG   Tablet, Oral) Active. LORazepam (1MG  Tablet, 1/2 tablet Oral twice a day) Active. LamoTRIgine (100MG  Tablet, Oral) Active. Lithium Carbonate (300MG   Capsule, Oral) Active. Synthroid (88MCG Tablet, Oral) Active.  Past Surgical History Appendectomy  Gallbladder Surgery  laporoscopic Hip Fracture and Surgery  right Total Hip Replacement  right   Review of Systems  General Not Present- Chills, Fatigue, Fever, Memory Loss, Night Sweats, Weight Gain and Weight Loss. Skin Not Present- Eczema, Hives, Itching, Lesions and Rash. HEENT Not Present- Dentures, Double Vision, Headache, Hearing Loss, Tinnitus and Visual Loss. Respiratory Present- Chronic Cough. Not Present- Allergies, Coughing up blood, Shortness of breath at rest and Shortness of breath with exertion. Cardiovascular Not Present- Chest Pain, Difficulty Breathing Lying Down, Murmur, Palpitations, Racing/skipping heartbeats and Swelling. Gastrointestinal Not Present- Abdominal Pain, Bloody Stool, Constipation, Diarrhea, Difficulty Swallowing, Heartburn, Jaundice, Loss of appetitie, Nausea and Vomiting. Male Genitourinary Not Present- Blood in Urine, Discharge, Flank Pain, Incontinence, Painful Urination, Urgency, Urinary frequency, Urinary Retention, Urinating at Night and Weak urinary stream. Musculoskeletal Not Present- Back Pain, Joint Pain, Joint Swelling, Morning Stiffness, Muscle Pain, Muscle Weakness and Spasms. 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 Psychiatric Not Present- Insomnia.  Vitals  Weight: 125 lb Height: 67in Weight was reported by patient. Height was reported by patient. Body Surface Area: 1.66 m Body Mass Index: 19.58 kg/m  Pulse: 108 (Regular)  BP: 146/68 (Sitting, Right Arm, Standard)   Physical Exam General Mental Status -Alert, cooperative and good historian. General Appearance-pleasant, Not in acute distress. Orientation-Oriented X3. Build & Nutrition-Well nourished and Well developed.  Head  and Neck Head-normocephalic, atraumatic . Neck Global Assessment - supple, no bruit auscultated on the right, no bruit auscultated on the left.  Eye Vision-Wears corrective lenses. Pupil - Bilateral-Regular and Round. Motion - Bilateral-EOMI.  ENMT Note: upper and lower dentures   Chest and Lung Exam Auscultation Breath sounds - clear at anterior chest wall and clear at posterior chest wall. Adventitious sounds - End inspiratory coarse crackles - Left Lower Lobe (Posterior).  Cardiovascular Auscultation Rhythm - Regular rate and rhythm. Heart Sounds - S1 WNL and S2 WNL. Murmurs & Other Heart Sounds - Auscultation of the heart reveals - No Murmurs.  Abdomen Palpation/Percussion Tenderness - Abdomen is non-tender to palpation. Rigidity (guarding) - Abdomen is soft. Auscultation Auscultation of the abdomen reveals - Bowel sounds normal.  Male Genitourinary Note: Not done, not pertinent to present illness   Musculoskeletal Note: He is in no distress. His right hip can be flexed to about 100, rotate 20 in each direction, abduct 30 with slight discomfort on range of motion.  I reviewed the x-rays from the ER. He has a locking constrained liner. He had a posterior dislocation. It is reduced, but the locking ring is displaced.  Assessment & Plan Status post right hip replacement (P61.950) Dislocation of right hip, initial encounter (S73.004A)  Note:Surgical Plans: Revision Right Total Hip Constrained Liner  Disposition: Home  PCP: Dr. Sinda Du  IV TXA  Anesthesia Issues: None  Patient was instructed on what medications to stop prior to surgery.  Signed electronically by Joelene Millin, III PA-C

## 2016-10-25 NOTE — Transfer of Care (Signed)
Immediate Anesthesia Transfer of Care Note  Patient: [name]  Procedure(s) Performed: Procedure(s): Revision right hip constrained liner (Right)  Patient Location: PACU  Anesthesia Type:Spinal  Level of Consciousness:  sedated, patient cooperative and responds to stimulation  Airway & Oxygen Therapy:Patient Spontanous Breathing and Patient connected to face mask oxgen  Post-op Assessment:  Report given to PACU RN and Post -op Vital signs reviewed and stable  Post vital signs:  Reviewed and stable  Last Vitals:  Vitals:   10/25/16 1415  BP: 116/76  Pulse: 92  Resp: 18  Temp: 57.5 C    Complications: No apparent anesthesia complications

## 2016-10-25 NOTE — Anesthesia Procedure Notes (Signed)
Spinal  Patient location during procedure: OR Start time: 10/25/2016 3:55 PM End time: 10/25/2016 4:03 PM Staffing Anesthesiologist: Duane Boston Performed: anesthesiologist  Preanesthetic Checklist Completed: patient identified, surgical consent, pre-op evaluation, timeout performed, IV checked, risks and benefits discussed and monitors and equipment checked Spinal Block Patient position: sitting Prep: DuraPrep Patient monitoring: cardiac monitor, continuous pulse ox and blood pressure Approach: midline Location: L2-3 Injection technique: single-shot Needle Needle type: Pencan and Quincke  Needle gauge: 22 G Needle length: 9 cm Additional Notes Functioning IV was confirmed and monitors were applied. Sterile prep and drape, including hand hygiene and sterile gloves were used. The patient was positioned and the spine was prepped. The skin was anesthetized with lidocaine.  Free flow of clear CSF was obtained prior to injecting local anesthetic into the CSF.  The spinal needle aspirated freely following injection.  The needle was carefully withdrawn.  The patient tolerated the procedure well.

## 2016-10-25 NOTE — Anesthesia Preprocedure Evaluation (Signed)
Anesthesia Evaluation  Patient identified by MRN, date of birth, ID band Patient awake    Reviewed: Allergy & Precautions, NPO status , Patient's Chart, lab work & pertinent test results  History of Anesthesia Complications Negative for: history of anesthetic complications  Airway Mallampati: II  TM Distance: >3 FB Neck ROM: Full    Dental  (+) Edentulous Lower, Edentulous Upper, Dental Advisory Given   Pulmonary shortness of breath, COPD, Current Smoker,    Pulmonary exam normal        Cardiovascular negative cardio ROS Normal cardiovascular exam     Neuro/Psych PSYCHIATRIC DISORDERS Anxiety Depression Bipolar Disorder negative neurological ROS     GI/Hepatic GERD  ,(+) Hepatitis -, C  Endo/Other    Renal/GU negative Renal ROS     Musculoskeletal   Abdominal   Peds  Hematology   Anesthesia Other Findings   Reproductive/Obstetrics                             Anesthesia Physical Anesthesia Plan  ASA: III  Anesthesia Plan: MAC and Spinal   Post-op Pain Management:    Induction: Intravenous  Airway Management Planned: Natural Airway and Simple Face Mask  Additional Equipment:   Intra-op Plan:   Post-operative Plan:   Informed Consent: I have reviewed the patients History and Physical, chart, labs and discussed the procedure including the risks, benefits and alternatives for the proposed anesthesia with the patient or authorized representative who has indicated his/her understanding and acceptance.   Dental advisory given  Plan Discussed with: CRNA and Anesthesiologist  Anesthesia Plan Comments:         Anesthesia Quick Evaluation

## 2016-10-26 LAB — BASIC METABOLIC PANEL
ANION GAP: 6 (ref 5–15)
BUN: 12 mg/dL (ref 6–20)
CHLORIDE: 96 mmol/L — AB (ref 101–111)
CO2: 28 mmol/L (ref 22–32)
Calcium: 8 mg/dL — ABNORMAL LOW (ref 8.9–10.3)
Creatinine, Ser: 0.69 mg/dL (ref 0.61–1.24)
GFR calc Af Amer: >60 mL/min (ref >60)
GFR calc non Af Amer: >60 mL/min (ref >60)
Glucose, Bld: 192 mg/dL — ABNORMAL HIGH (ref 65–99)
POTASSIUM: 3.3 mmol/L — AB (ref 3.5–5.1)
SODIUM: 130 mmol/L — AB (ref 135–145)

## 2016-10-26 LAB — CBC
HCT: 37.7 % — ABNORMAL LOW (ref 39.0–52.0)
HEMOGLOBIN: 12.7 g/dL — AB (ref 13.0–17.0)
MCH: 32.8 pg (ref 26.0–34.0)
MCHC: 33.7 g/dL (ref 30.0–36.0)
MCV: 97.4 fL (ref 78.0–100.0)
Platelets: 211 10*3/uL (ref 150–400)
RBC: 3.87 MIL/uL — AB (ref 4.22–5.81)
RDW: 12.1 % (ref 11.5–15.5)
WBC: 6.1 10*3/uL (ref 4.0–10.5)

## 2016-10-26 MED ORDER — METHOCARBAMOL 500 MG PO TABS: 500.0000 mg | ORAL_TABLET | Freq: Four times a day (QID) | ORAL | 0 refills | Status: DC | PRN

## 2016-10-26 MED ORDER — TRAMADOL HCL 50 MG PO TABS: 50.0000 mg | ORAL_TABLET | Freq: Four times a day (QID) | ORAL | 0 refills | Status: DC | PRN

## 2016-10-26 MED ORDER — ASPIRIN 325 MG PO TBEC: 325.0000 mg | DELAYED_RELEASE_TABLET | Freq: Every day | ORAL | 0 refills | Status: DC

## 2016-10-26 MED ORDER — OXYCODONE HCL 5 MG PO TABS: 10.0000 mg | ORAL_TABLET | ORAL | 0 refills | Status: DC | PRN

## 2016-10-26 MED ORDER — POTASSIUM CHLORIDE CRYS ER 20 MEQ PO TBCR
40.0000 meq | EXTENDED_RELEASE_TABLET | ORAL | Status: AC
  Administered 2016-10-26 (×2): 40 meq via ORAL
  Filled 2016-10-26 (×2): qty 2

## 2016-10-26 NOTE — Evaluation (Signed)
Physical Therapy Evaluation Patient Details Name: [name] MRN: [id number] DOB: 02/16/1961 Today's Date: 10/26/2016   History of Present Illness  RTHA  dislocation, redo constrained liner.  Clinical Impression  The patient  requires extra time to mobilize, reviewed precautions for posterior hip. Plans to return home. Will need a 3 in 1. Pt admitted with above diagnosis. Pt currently with functional limitations due to the deficits listed below (see PT Problem List).  Pt will benefit from skilled PT to increase their independence and safety with mobility to allow discharge to the venue listed below.       Follow Up Recommendations Home health PT;Supervision/Assistance - 24 hour    Equipment Recommendations  3in1 (PT)    Recommendations for Other Services       Precautions / Restrictions Precautions Precautions: Fall;Posterior Hip      Mobility  Bed Mobility Overal bed mobility: Needs Assistance Bed Mobility: Supine to Sit     Supine to sit: Min assist     General bed mobility comments: assist with the right leg, , used sheet to self assist.  Transfers Overall transfer level: Needs assistance Equipment used: Rolling walker (2 wheeled) Transfers: Sit to/from Stand Sit to Stand: Min assist         General transfer comment: cues for precautions  Ambulation/Gait Ambulation/Gait assistance: Min assist Ambulation Distance (Feet): 5 Feet Assistive device: Rolling walker (2 wheeled) Gait Pattern/deviations: Step-to pattern;Antalgic     General Gait Details: cues for sequence  Stairs            Wheelchair Mobility    Modified Rankin (Stroke Patients Only)       Balance                                             Pertinent Vitals/Pain Pain Assessment: 0-10 Pain Score: 4  Pain Location: right hip Pain Descriptors / Indicators: Discomfort;Sore Pain Intervention(s): Monitored during session;Premedicated before  session;Repositioned    Home Living Family/patient expects to be discharged to:: Private residence Living Arrangements: Parent;Other relatives Available Help at Discharge: Available 24 hours/day Type of Home: House Home Access: Level entry     Home Layout: One level Home Equipment: Walker - 2 wheels;Shower seat      Prior Function Level of Independence: Independent with assistive device(s)               Hand Dominance        Extremity/Trunk Assessment   Upper Extremity Assessment Upper Extremity Assessment: Overall WFL for tasks assessed    Lower Extremity Assessment Lower Extremity Assessment: RLE deficits/detail RLE Deficits / Details: limited weight on the right leg  initially    Cervical / Trunk Assessment Cervical / Trunk Assessment: Other exceptions Cervical / Trunk Exceptions: head forward  Communication   Communication: No difficulties  Cognition Arousal/Alertness: Awake/alert Behavior During Therapy: WFL for tasks assessed/performed Overall Cognitive Status: Within Functional Limits for tasks assessed                                        General Comments      Exercises     Assessment/Plan    PT Assessment Patient needs continued PT services  PT Problem List Decreased strength;Decreased range of motion;Decreased activity tolerance;Decreased balance;Decreased mobility;Decreased knowledge  of precautions;Decreased safety awareness;Decreased knowledge of use of DME       PT Treatment Interventions DME instruction;Gait training;Functional mobility training;Therapeutic activities;Patient/family education    PT Goals (Current goals can be found in the Care Plan section)  Acute Rehab PT Goals Patient Stated Goal: to walk, not dislocate PT Goal Formulation: With patient/family Time For Goal Achievement: 11/02/16 Potential to Achieve Goals: Good    Frequency 7X/week   Barriers to discharge Decreased caregiver support       Co-evaluation               AM-PAC PT "6 Clicks" Daily Activity  Outcome Measure Difficulty turning over in bed (including adjusting bedclothes, sheets and blankets)?: A Little Difficulty moving from lying on back to sitting on the side of the bed? : A Little Difficulty sitting down on and standing up from a chair with arms (e.g., wheelchair, bedside commode, etc,.)?: A Little Help needed moving to and from a bed to chair (including a wheelchair)?: A Little Help needed walking in hospital room?: A Little Help needed climbing 3-5 steps with a railing? : A Lot 6 Click Score: 17    End of Session   Activity Tolerance: Patient tolerated treatment well Patient left: in chair;with call bell/phone within reach;with family/visitor present Nurse Communication: Mobility status PT Visit Diagnosis: Difficulty in walking, not elsewhere classified (R26.2);Pain Pain - Right/Left: Right Pain - part of body: Hip    Time: 1013-1109 PT Time Calculation (min) (ACUTE ONLY): 56 min   Charges:   PT Evaluation $PT Eval Low Complexity: 1 Procedure PT Treatments $Gait Training: 8-22 mins $Therapeutic Activity: 8-22 mins $Self Care/Home Management: 8-22   PT G CodesTresa Endo PT [place] 10/26/2016, 1:53 PM

## 2016-10-26 NOTE — Progress Notes (Signed)
   Subjective: 1 Day Post-Op Procedure(s) (LRB): Revision right hip constrained liner (Right) Patient reports pain as mild.   We will start therapy today.  Plan is to go Home after hospital stay.  Objective: Vital signs in last 24 hours: Temp:  [97.6 F (36.4 C)-98.8 F (37.1 C)] 97.6 F (36.4 C) (05/05 0646) Pulse Rate:  [81-92] 85 (05/05 0646) Resp:  [16-21] 16 (05/05 0646) BP: (90-127)/(46-87) 90/52 (05/05 0646) SpO2:  [92 %-100 %] 99 % (05/05 0646) Weight:  [58.1 kg (128 lb)] 58.1 kg (128 lb) (05/04 1426)  Intake/Output from previous day:  Intake/Output Summary (Last 24 hours) at 10/26/16 0721 Last data filed at 10/26/16 0644  Gross per 24 hour  Intake          3621.25 ml  Output             3725 ml  Net          -103.75 ml    Intake/Output this shift: No intake/output data recorded.  Labs:  Recent Labs  10/26/16 0455  HGB 12.7*    Recent Labs  10/26/16 0455  WBC 6.1  RBC 3.87*  HCT 37.7*  PLT 211    Recent Labs  10/26/16 0455  NA 130*  K 3.3*  CL 96*  CO2 28  BUN 12  CREATININE 0.69  GLUCOSE 192*  CALCIUM 8.0*   No results for input(s): LABPT, INR in the last 72 hours.  EXAM General - Patient is Alert, Appropriate and Oriented Extremity - Neurologically intact Neurovascular intact Dorsiflexion/Plantar flexion intact No cellulitis present Compartment soft Dressing - dressing C/D/I Motor Function - intact, moving foot and toes well on exam.  Hemovac pulled without difficulty.  Past Medical History:  Diagnosis Date  . Anxiety   . Bipolar disorder ([place])   . BPH (benign prostatic hyperplasia)   . Cirrhosis ([place])   . COPD (chronic obstructive pulmonary disease) ([place])   . Depression   . Epicondylitis    hips  . ETOH abuse   . GERD (gastroesophageal reflux disease)   . Head injury   . Hepatitis C   . Hypothyroidism   . Oxygen decrease   . Pancreatitis chronic   . Shortness of breath     Assessment/Plan: 1 Day Post-Op  Procedure(s) (LRB): Revision right hip constrained liner (Right) Principal Problem:   Failed total hip arthroplasty with dislocation (HCC) Active Problems:   Failed total hip arthroplasty, subsequent encounter   Advance diet Up with therapy D/C IV fluids Plan for discharge tomorrow  DVT Prophylaxis - Aspirin Weight Bearing As Tolerated right Leg Hemovac Pulled Begin Therapy  [name]

## 2016-10-26 NOTE — Care Management Note (Signed)
56 yo M admitted with R hip dislocation underwent revision right hip constrained liner (Right)  Received referral to assist with [facility] and DME.  Met with pt, sister, and mother at bedside. He plans to return home with the support of his family. He has a 3-in-1 BSC. He needs a RW. Provided pt with a list of [place] agencies. Referral was made to [facility] by the physician's office. Pt agreed with [facility]. Contacted Reggie at [facility] for DME referral.

## 2016-10-26 NOTE — Discharge Instructions (Signed)
Dr. Gaynelle Arabian Total Joint Specialist [facility] [address], [place], [place] [address] [phone]   POSTERIOR TOTAL HIP REPLACEMENT POSTOPERATIVE DIRECTIONS  Hip Rehabilitation, Guidelines Following Surgery  The results of a hip operation are greatly improved after range of motion and muscle strengthening exercises. Follow all safety measures which are given to protect your hip. If any of these exercises cause increased pain or swelling in your joint, decrease the amount until you are comfortable again. Then slowly increase the exercises. Call your caregiver if you have problems or questions.   HOME CARE INSTRUCTIONS  Remove items at home which could result in a fall. This includes throw rugs or furniture in walking pathways.   ICE to the affected hip every three hours for 30 minutes at a time and then as needed for pain and swelling.  Continue to use ice on the hip for pain and swelling from surgery. You may notice swelling that will progress down to the foot and ankle.  This is normal after surgery.  Elevate the leg when you are not up walking on it.    Continue to use the breathing machine which will help keep your temperature down.  It is common for your temperature to cycle up and down following surgery, especially at night when you are not up moving around and exerting yourself.  The breathing machine keeps your lungs expanded and your temperature down.  DIET You may resume your previous home diet once your are discharged from the hospital.  DRESSING / WOUND CARE / SHOWERING You may start showering once you are discharged home but do not submerge the incision under water. Just pat the incision dry and apply a dry gauze dressing on daily. Change the surgical dressing daily and reapply a dry dressing each time.    ACTIVITY Walk with your walker as instructed. Use walker as long as suggested by your caregivers. Avoid periods of inactivity such  as sitting longer than an hour when not asleep. This helps prevent blood clots.  You may resume a sexual relationship in one month or when given the OK by your doctor.  You may return to work once you are cleared by your doctor.  Do not drive a car for 6 weeks or until released by you surgeon.  Do not drive while taking narcotics.  WEIGHT BEARING as tolerated   POSTOPERATIVE CONSTIPATION PROTOCOL Constipation - defined medically as fewer than three stools per week and severe constipation as less than one stool per week.  One of the most common issues patients have following surgery is constipation.  Even if you have a regular bowel pattern at home, your normal regimen is likely to be disrupted due to multiple reasons following surgery.  Combination of anesthesia, postoperative narcotics, change in appetite and fluid intake all can affect your bowels.  In order to avoid complications following surgery, here are some recommendations in order to help you during your recovery period.  Colace (docusate) - Pick up an over-the-counter form of Colace or another stool softener and take twice a day as long as you are requiring postoperative pain medications.  Take with a full glass of water daily.  If you experience loose stools or diarrhea, hold the colace until you stool forms back up.  If your symptoms do not get better within 1 week or if they get worse, check with your doctor.  Dulcolax (bisacodyl) - Pick up over-the-counter and take as directed by the product packaging  as needed to assist with the movement of your bowels.  Take with a full glass of water.  Use this product as needed if not relieved by Colace only.   MiraLax (polyethylene glycol) - Pick up over-the-counter to have on hand.  MiraLax is a solution that will increase the amount of water in your bowels to assist with bowel movements.  Take as directed and can mix with a glass of water, juice, soda, coffee, or tea.  Take if you go more than  two days without a movement. Do not use MiraLax more than once per day. Call your doctor if you are still constipated or irregular after using this medication for 7 days in a row.  If you continue to have problems with postoperative constipation, please contact the office for further assistance and recommendations.  If you experience "the worst abdominal pain ever" or develop nausea or vomiting, please contact the office immediatly for further recommendations for treatment.  ITCHING  If you experience itching with your medications, try taking only a single pain pill, or even half a pain pill at a time.  You can also use Benadryl over the counter for itching or also to help with sleep.   TED HOSE STOCKINGS Wear the elastic stockings on both legs for three weeks following surgery during the day but you may remove then at night for sleeping.  MEDICATIONS See your medication summary on the After Visit Summary that the nursing staff will review with you prior to discharge.  You may have some home medications which will be placed on hold until you complete the course of blood thinner medication.  It is important for you to complete the blood thinner medication as prescribed by your surgeon.  Continue your approved medications as instructed at time of discharge.  PRECAUTIONS If you experience chest pain or shortness of breath - call 911 immediately for transfer to the hospital emergency department.  If you develop a fever greater that 101 F, purulent drainage from wound, increased redness or drainage from wound, foul odor from the wound/dressing, or calf pain - CONTACT YOUR SURGEON.                                                   FOLLOW-UP APPOINTMENTS Make sure you keep all of your appointments after your operation with your surgeon and caregivers. You should call the office at the above phone number and make an appointment for approximately two weeks after the date of your surgery or on the date  instructed by your surgeon outlined in the "After Visit Summary".  RANGE OF MOTION AND STRENGTHENING EXERCISES  These exercises are designed to help you keep full movement of your hip joint. Follow your caregiver's or physical therapist's instructions. Perform all exercises about fifteen times, three times per day or as directed. Exercise both hips, even if you have had only one joint replacement. These exercises can be done on a training (exercise) mat, on the floor, on a table or on a bed. Use whatever works the best and is most comfortable for you. Use music or television while you are exercising so that the exercises are a pleasant break in your day. This will make your life better with the exercises acting as a break in routine you can look forward to.  Lying on your back, slowly  slide your foot toward your buttocks, raising your knee up off the floor. Then slowly slide your foot back down until your leg is straight again.  Lying on your back spread your legs as far apart as you can without causing discomfort.  Lying on your side, raise your upper leg and foot straight up from the floor as far as is comfortable. Slowly lower the leg and repeat.  Lying on your back, tighten up the muscle in the front of your thigh (quadriceps muscles). You can do this by keeping your leg straight and trying to raise your heel off the floor. This helps strengthen the largest muscle supporting your knee.  Lying on your back, tighten up the muscles of your buttocks both with the legs straight and with the knee bent at a comfortable angle while keeping your heel on the floor.      IF YOU ARE TRANSFERRED TO A SKILLED REHAB FACILITY If the patient is transferred to a skilled rehab facility following release from the hospital, a list of the current medications will be sent to the facility for the patient to continue.  When discharged from the skilled rehab facility, please have the facility set up the patient's [place] prior to being released. Also, the skilled facility will be responsible for providing the patient with their medications at time of release from the facility to include their pain medication, the muscle relaxants, and their blood thinner medication. If the patient is still at the rehab facility at time of the two week follow up appointment, the skilled rehab facility will also need to assist the patient in arranging follow up appointment in our office and any transportation needs.  MAKE SURE YOU:  Understand these instructions.  Get help right away if you are not doing well or get worse.    Pick up stool softner and laxative for home use following surgery while on pain medications. Do not submerge incision under water. Please use good hand washing techniques while changing dressing each day. May shower starting three days after surgery. Please use a clean towel to pat the incision dry following showers. Continue to use ice for pain and swelling after surgery. Do not use any lotions or creams on the incision until instructed by your surgeon.

## 2016-10-27 LAB — BASIC METABOLIC PANEL
ANION GAP: 5 (ref 5–15)
BUN: 10 mg/dL (ref 6–20)
CALCIUM: 9 mg/dL (ref 8.9–10.3)
CO2: 32 mmol/L (ref 22–32)
Chloride: 101 mmol/L (ref 101–111)
Creatinine, Ser: 0.76 mg/dL (ref 0.61–1.24)
GFR calc Af Amer: >60 mL/min (ref >60)
GLUCOSE: 132 mg/dL — AB (ref 65–99)
Potassium: 4.7 mmol/L (ref 3.5–5.1)
SODIUM: 138 mmol/L (ref 135–145)

## 2016-10-27 LAB — CBC
HCT: 40.4 % (ref 39.0–52.0)
Hemoglobin: 13.2 g/dL (ref 13.0–17.0)
MCH: 32.5 pg (ref 26.0–34.0)
MCHC: 32.7 g/dL (ref 30.0–36.0)
MCV: 99.5 fL (ref 78.0–100.0)
PLATELETS: 226 10*3/uL (ref 150–400)
RBC: 4.06 MIL/uL — AB (ref 4.22–5.81)
RDW: 12.3 % (ref 11.5–15.5)
WBC: 8.4 10*3/uL (ref 4.0–10.5)

## 2016-10-27 NOTE — Progress Notes (Signed)
   Subjective: 2 Days Post-Op Procedure(s) (LRB): Revision right hip constrained liner (Right)  c/o minimal pain to right hip Ready for d/c later today Denies any new symptoms or issues Patient reports pain as mild.  Objective:   VITALS:   Vitals:   10/26/16 2102 10/27/16 0430  BP: (!) 97/54 (!) 98/56  Pulse: 85 84  Resp: 17 17  Temp: 97.7 F (36.5 C) 97.6 F (36.4 C)    Right hip incision healing well No erythema or drainage nv intact distally  LABS  Recent Labs  10/26/16 0455 10/27/16 0427  HGB 12.7* 13.2  HCT 37.7* 40.4  WBC 6.1 8.4  PLT 211 226     Recent Labs  10/26/16 0455 10/27/16 0427  NA 130* 138  K 3.3* 4.7  BUN 12 10  CREATININE 0.69 0.76  GLUCOSE 192* 132*     Assessment/Plan: 2 Days Post-Op Procedure(s) (LRB): Revision right hip constrained liner (Right) D/c home today after therapy f/u in 2 weeks with Dr.Aluisio Hip precautions Pain management    Merla Riches, MPAS, PA-C  10/27/2016, 7:27 AM

## 2016-10-27 NOTE — Progress Notes (Signed)
Physical Therapy Treatment Patient Details Name: [name] MRN: [id number] DOB: 08-28-1960 Today's Date: 10/27/2016    History of Present Illness RTHA  dislocation, redo constrained liner.    PT Comments    Pt continues to require increased time for all tasks but slowly progressing with mobility and activity tolerance.   Follow Up Recommendations  Home health PT     Equipment Recommendations  Rolling walker with 5" wheels;3in1 (PT)    Recommendations for Other Services       Precautions / Restrictions Precautions Precautions: Fall;Posterior Hip Precaution Comments: Pt recalls 2/3 THP without cues Restrictions Weight Bearing Restrictions: No Other Position/Activity Restrictions: WBAT    Mobility  Bed Mobility Overal bed mobility: Needs Assistance Bed Mobility: Supine to Sit     Supine to sit: Supervision     General bed mobility comments: cues for sequence, use of L LE to self assist and correct use of leg lifter  Transfers Overall transfer level: Needs assistance Equipment used: Rolling walker (2 wheeled) Transfers: Sit to/from Stand Sit to Stand: Min guard         General transfer comment: pt self cues for precautions and for use of UEs to self assist  Ambulation/Gait Ambulation/Gait assistance: Min guard;Supervision Ambulation Distance (Feet): 74 Feet Assistive device: Rolling walker (2 wheeled) Gait Pattern/deviations: Step-to pattern;Antalgic;Decreased step length - right;Decreased step length - left;Shuffle;Trunk flexed Gait velocity: decr Gait velocity interpretation: Below normal speed for age/gender General Gait Details: cues for sequence, posture and position from [employer]            Wheelchair Mobility    Modified Rankin (Stroke Patients Only)       Balance Overall balance assessment: Needs assistance Sitting-balance support: No upper extremity supported;Feet supported Sitting balance-Leahy Scale: Good     Standing  balance support: No upper extremity supported Standing balance-Leahy Scale: Fair                              Cognition Arousal/Alertness: Awake/alert Behavior During Therapy: WFL for tasks assessed/performed Overall Cognitive Status: Within Functional Limits for tasks assessed                                        Exercises Total Joint Exercises Ankle Circles/Pumps: AROM;Both;15 reps;Supine Quad Sets: AROM;Both;10 reps;Supine Heel Slides: AAROM;Right;20 reps;Supine Hip ABduction/ADduction: AAROM;Right;15 reps;Supine    General Comments        Pertinent Vitals/Pain Pain Assessment: 0-10 Pain Score: 4  Pain Location: right hip Pain Descriptors / Indicators: Sore;Aching Pain Intervention(s): Limited activity within patient's tolerance;Monitored during session;Premedicated before session;Ice applied    Home Living                      Prior Function            PT Goals (current goals can now be found in the care plan section) Acute Rehab PT Goals Patient Stated Goal: to walk, not dislocate PT Goal Formulation: With patient/family Time For Goal Achievement: 11/02/16 Potential to Achieve Goals: Good Progress towards PT goals: Progressing toward goals    Frequency    7X/week      PT Plan Current plan remains appropriate    Co-evaluation              AM-PAC PT "6 Clicks" Daily Activity  Outcome Measure  Difficulty turning over in bed (including adjusting bedclothes, sheets and blankets)?: A Little Difficulty moving from lying on back to sitting on the side of the bed? : A Little Difficulty sitting down on and standing up from a chair with arms (e.g., wheelchair, bedside commode, etc,.)?: A Little Help needed moving to and from a bed to chair (including a wheelchair)?: A Little Help needed walking in hospital room?: A Little Help needed climbing 3-5 steps with a railing? : A Lot 6 Click Score: 17    End of Session  Equipment Utilized During Treatment: Gait belt Activity Tolerance: Patient tolerated treatment well Patient left: in chair;with call bell/phone within reach;with family/visitor present Nurse Communication: Mobility status PT Visit Diagnosis: Difficulty in walking, not elsewhere classified (R26.2);Pain Pain - Right/Left: Right Pain - part of body: Hip     Time: 1208-1231 PT Time Calculation (min) (ACUTE ONLY): 23 min  Charges:  $Gait Training: 23-37 mins $Therapeutic Exercise: 8-22 mins $Therapeutic Activity: 8-22 mins                    G Codes:       Pg [phone]    Cailean Heacock 10/27/2016, 1:02 PM

## 2016-10-27 NOTE — Discharge Summary (Signed)
Physician Discharge Summary   Patient ID: [name] MRN: [id number] DOB/AGE: 09/02/1960 56 y.o.  Admit date: 10/25/2016 Discharge date: 10/27/2016  Admission Diagnoses:  Principal Problem:   Failed total hip arthroplasty with dislocation [facility]) Active Problems:   Failed total hip arthroplasty, subsequent encounter   Discharge Diagnoses:  Same   Surgeries: Procedure(s): Revision right hip constrained liner on 10/25/2016   Consultants: PT/OT  Discharged Condition: Stable  Hospital Course: [name] is an 56 y.o. male who was admitted 10/25/2016 with a chief complaint of right hip pain with dislocation, and found to have a diagnosis of Failed total hip arthroplasty with dislocation ([place]).  They were brought to the operating room on 10/25/2016 and underwent the above named procedures.    The patient had an uncomplicated hospital course and was stable for discharge.  Recent vital signs:  Vitals:   10/26/16 2102 10/27/16 0430  BP: (!) 97/54 (!) 98/56  Pulse: 85 84  Resp: 17 17  Temp: 97.7 F (36.5 C) 97.6 F (36.4 C)    Recent laboratory studies:  Results for orders placed or performed during the hospital encounter of 10/25/16  CBC  Result Value Ref Range   WBC 6.1 4.0 - 10.5 K/uL   RBC 3.87 (L) 4.22 - 5.81 MIL/uL   Hemoglobin 12.7 (L) 13.0 - 17.0 g/dL   HCT 37.7 (L) 39.0 - 52.0 %   MCV 97.4 78.0 - 100.0 fL   MCH 32.8 26.0 - 34.0 pg   MCHC 33.7 30.0 - 36.0 g/dL   RDW 12.1 11.5 - 15.5 %   Platelets 211 150 - 400 K/uL  Basic metabolic panel  Result Value Ref Range   Sodium 130 (L) 135 - 145 mmol/L   Potassium 3.3 (L) 3.5 - 5.1 mmol/L   Chloride 96 (L) 101 - 111 mmol/L   CO2 28 22 - 32 mmol/L   Glucose, Bld 192 (H) 65 - 99 mg/dL   BUN 12 6 - 20 mg/dL   Creatinine, Ser 0.69 0.61 - 1.24 mg/dL   Calcium 8.0 (L) 8.9 - 10.3 mg/dL   GFR calc non Af Amer >60 >60 mL/min   GFR calc Af Amer >60 >60 mL/min   Anion gap 6 5 - 15  CBC  Result Value Ref Range   WBC 8.4  4.0 - 10.5 K/uL   RBC 4.06 (L) 4.22 - 5.81 MIL/uL   Hemoglobin 13.2 13.0 - 17.0 g/dL   HCT 40.4 39.0 - 52.0 %   MCV 99.5 78.0 - 100.0 fL   MCH 32.5 26.0 - 34.0 pg   MCHC 32.7 30.0 - 36.0 g/dL   RDW 12.3 11.5 - 15.5 %   Platelets 226 150 - 400 K/uL  Basic metabolic panel  Result Value Ref Range   Sodium 138 135 - 145 mmol/L   Potassium 4.7 3.5 - 5.1 mmol/L   Chloride 101 101 - 111 mmol/L   CO2 32 22 - 32 mmol/L   Glucose, Bld 132 (H) 65 - 99 mg/dL   BUN 10 6 - 20 mg/dL   Creatinine, Ser 0.76 0.61 - 1.24 mg/dL   Calcium 9.0 8.9 - 10.3 mg/dL   GFR calc non Af Amer >60 >60 mL/min   GFR calc Af Amer >60 >60 mL/min   Anion gap 5 5 - 15    Discharge Medications:   Allergies as of 10/27/2016      Reactions   Penicillins Anaphylaxis   PT STATES HE IS NOT ALLERGIC  TO PENICILLIN AND HAS NEVER HAD A REACTION    Vancomycin    redman syndrome   Codeine Hives   Tylenol [acetaminophen] Other (See Comments)   Patient has hepatitis      Medication List    TAKE these medications   aspirin 325 MG EC tablet Take 1 tablet (325 mg total) by mouth daily with breakfast.   fentaNYL 100 MCG/HR Commonly known as:  DURAGESIC - dosed mcg/hr Place 1 patch (100 mcg total) onto the skin every other day. This dose was verified, it is correct What changed:  additional instructions   lamoTRIgine 100 MG tablet Commonly known as:  LAMICTAL Take 1 tablet (100 mg total) by mouth 2 (two) times daily.   levothyroxine 88 MCG tablet Commonly known as:  SYNTHROID, LEVOTHROID Take 88 mcg by mouth daily.   lithium carbonate 300 MG capsule Take 1 capsule (300 mg total) by mouth 2 (two) times daily with a meal.   LORazepam 1 MG tablet Commonly known as:  ATIVAN Take one every 6-8 hours as needed for shaking and anxiety What changed:  how much to take  how to take this  when to take this  additional instructions   methocarbamol 500 MG tablet Commonly known as:  ROBAXIN Take 1 tablet (500 mg  total) by mouth every 6 (six) hours as needed for muscle spasms.   multivitamin with minerals Tabs tablet Take 1 tablet by mouth daily.   oxyCODONE 5 MG immediate release tablet Commonly known as:  Oxy IR/ROXICODONE Take 2 tablets (10 mg total) by mouth every 3 (three) hours as needed for breakthrough pain. What changed:  medication strength  when to take this  reasons to take this   OXYGEN Inhale 2.5 L into the lungs daily as needed (for breathing).   traMADol 50 MG tablet Commonly known as:  ULTRAM Take 1-2 tablets (50-100 mg total) by mouth every 6 (six) hours as needed for moderate pain.            Durable Medical Equipment        Start     Ordered   10/26/16 1342  For home use only DME 3 n 1  Once     10/26/16 1342      Diagnostic Studies: Dg Pelvis Portable  Result Date: 10/25/2016 CLINICAL DATA:  56 year old male status post revision right hip arthroplasty. EXAM: PORTABLE PELVIS 1-2 VIEWS COMPARISON:  Right hip radiograph 08/19/2007. FINDINGS: Postoperative changes of recent revision arthroplasty are noted. No periprosthetic fracture adjacent to the acetabular cup or the femoral stem. Prosthetic femoral head appears to project within the prosthetic acetabulum on this single view examination. Gas in the joint space in the overlying soft tissues. Surgical drain in place lateral to the right hip joint. IMPRESSION: 1. Expected postoperative findings of recent right hip revision arthroplasty, as above, without acute complicating features. Electronically Signed   By: Vinnie Langton M.D.   On: 10/25/2016 18:07    Disposition: 01-Home or Self Care  Discharge Instructions    Call MD / Call 911    Complete by:  As directed    If you experience chest pain or shortness of breath, CALL 911 and be transported to the hospital emergency room.  If you develope a fever above 101 F, pus (white drainage) or increased drainage or redness at the wound, or calf pain, call your  surgeon's office.   Constipation Prevention    Complete by:  As directed    Drink  plenty of fluids.  Prune juice may be helpful.  You may use a stool softener, such as Colace (over the counter) 100 mg twice a day.  Use MiraLax (over the counter) for constipation as needed.   Diet - low sodium heart healthy    Complete by:  As directed    Increase activity slowly as tolerated    Complete by:  As directed       Follow-up Information    Gaynelle Arabian, MD. Schedule an appointment as soon as possible for a visit on 11/05/2016.   Specialty:  Orthopedic Surgery Why:  Call [phone] Monday to make the appointment Contact information: [address] [place] [address] [phone]        [facility] Follow up.   Specialty:  Home Health Services Why:  [facility] (formerly called [facility] will provide home health physical therapy. Contact information: [address] [place] [address] [phone]            Signed: Ventura Bruns 10/27/2016, 7:28 AM

## 2016-10-27 NOTE — Progress Notes (Signed)
Physical Therapy Treatment Patient Details Name: [name] MRN: [id number] DOB: 08-14-1960 Today's Date: 10/27/2016    History of Present Illness RTHA  dislocation, redo constrained liner.    PT Comments    Pt progressing with mobility but requiring increased time for all tasks.  Reviewed therex, bed mobility, gait and car transfers.   Follow Up Recommendations  Home health PT;Supervision/Assistance - 24 hour     Equipment Recommendations  Rolling walker with 5" wheels;3in1 (PT)    Recommendations for Other Services       Precautions / Restrictions Precautions Precautions: Fall;Posterior Hip Precaution Comments: Pt recalls 1/3 THP without cues Restrictions Weight Bearing Restrictions: No    Mobility  Bed Mobility Overal bed mobility: Needs Assistance Bed Mobility: Supine to Sit     Supine to sit: Min guard     General bed mobility comments: cues for sequence, use of L LE to self assist and correct use of leg lifter  Transfers Overall transfer level: Needs assistance Equipment used: Rolling walker (2 wheeled) Transfers: Sit to/from Stand Sit to Stand: Min assist         General transfer comment: cues for precautions and for use of UEs to self assist  Ambulation/Gait Ambulation/Gait assistance: Min assist;Min guard Ambulation Distance (Feet): 38 Feet Assistive device: Rolling walker (2 wheeled) Gait Pattern/deviations: Step-to pattern;Antalgic;Decreased step length - right;Decreased step length - left;Shuffle;Trunk flexed Gait velocity: decr Gait velocity interpretation: Below normal speed for age/gender General Gait Details: cues for sequence, posture and position from [employer]            Wheelchair Mobility    Modified Rankin (Stroke Patients Only)       Balance Overall balance assessment: Needs assistance Sitting-balance support: No upper extremity supported;Feet supported Sitting balance-Leahy Scale: Good     Standing balance  support: No upper extremity supported Standing balance-Leahy Scale: Fair                              Cognition Arousal/Alertness: Awake/alert Behavior During Therapy: WFL for tasks assessed/performed Overall Cognitive Status: Within Functional Limits for tasks assessed                                        Exercises Total Joint Exercises Ankle Circles/Pumps: AROM;Both;15 reps;Supine Quad Sets: AROM;Both;10 reps;Supine Heel Slides: AAROM;Right;20 reps;Supine Hip ABduction/ADduction: AAROM;Right;15 reps;Supine    General Comments        Pertinent Vitals/Pain Pain Assessment: 0-10 Pain Score: 4  Pain Location: right hip Pain Descriptors / Indicators: Sore;Aching Pain Intervention(s): Limited activity within patient's tolerance;Monitored during session;Premedicated before session;Ice applied    Home Living                      Prior Function            PT Goals (current goals can now be found in the care plan section) Acute Rehab PT Goals Patient Stated Goal: to walk, not dislocate PT Goal Formulation: With patient/family Time For Goal Achievement: 11/02/16 Potential to Achieve Goals: Good Progress towards PT goals: Progressing toward goals    Frequency    7X/week      PT Plan Current plan remains appropriate    Co-evaluation              AM-PAC PT "6 Clicks" Daily  Activity  Outcome Measure  Difficulty turning over in bed (including adjusting bedclothes, sheets and blankets)?: A Little Difficulty moving from lying on back to sitting on the side of the bed? : A Little Difficulty sitting down on and standing up from a chair with arms (e.g., wheelchair, bedside commode, etc,.)?: A Little Help needed moving to and from a bed to chair (including a wheelchair)?: A Little Help needed walking in hospital room?: A Little Help needed climbing 3-5 steps with a railing? : A Lot 6 Click Score: 17    End of Session  Equipment Utilized During Treatment: Gait belt Activity Tolerance: Patient tolerated treatment well Patient left: in chair;with call bell/phone within reach;with family/visitor present Nurse Communication: Mobility status PT Visit Diagnosis: Difficulty in walking, not elsewhere classified (R26.2);Pain Pain - Right/Left: Right Pain - part of body: Hip     Time: 5465-6812 PT Time Calculation (min) (ACUTE ONLY): 42 min  Charges:  $Gait Training: 8-22 mins $Therapeutic Exercise: 8-22 mins $Therapeutic Activity: 8-22 mins                    G Codes:       Pg [phone]    [name] 10/27/2016, 12:56 PM

## 2016-10-27 NOTE — Progress Notes (Signed)
Discharge instructions provided to patient; explained in detail. Pt verbalizes understanding. Pt dc home with prescriptions and ordered DME; escorted to lobby via nursing tech

## 2016-10-28 ENCOUNTER — Encounter (HOSPITAL_COMMUNITY): Payer: Self-pay | Admitting: Orthopedic Surgery

## 2016-10-29 NOTE — Op Note (Signed)
NAMEKENDEL, [name]               ACCOUNT NO.:  [id number]  MEDICAL RECORD NO.:  [id number]  LOCATION:                                 FACILITY:  PHYSICIAN:  Gaynelle Arabian, M.D.    DATE OF BIRTH:  08/13/1960  DATE OF PROCEDURE: DATE OF DISCHARGE:                              OPERATIVE REPORT   PREOPERATIVE DIAGNOSIS:  Recurrent right hip dislocation.  POSTOPERATIVE DIAGNOSIS:  Recurrent right hip dislocation.  PROCEDURE:  Right hip revision to constrained liner.  SURGEON:  Gaynelle Arabian, M.D.  ASSISTANT:  Alexzandrew L. Perkins, P.A.C.  ANESTHESIA:  Spinal.  ESTIMATED BLOOD LOSS:  100 mL.  DRAINS:  Hemovac x1.  COMPLICATIONS:  None.  CONDITION:  Stable to recovery.  BRIEF CLINICAL NOTE:  [name] is a 56 year old male, multiple medical problems, who had a total hip arthroplasty done over 10 years ago.  He had dislocations and had a constrained liner placed.  He had that in for approximately 9 years and then broke a constrained liner last month. The emergency room was able to get him reduced but the locking ring was not intact.  He presents now for revision of the constrained liner.  PROCEDURE IN DETAIL:  After successful administration of spinal anesthetic, the patient was placed in the left lateral decubitus position with the right side up and held with the hip positioner.  Right lower extremity was isolated from the perineum with plastic drapes and prepped and draped in the usual sterile fashion.  A short posterolateral incision was reutilized, skin cut with 10 blade through the subcutaneous tissue to the fascia lata which was incised in line with the skin incision.  Sciatic nerve was palpated and protected.  Posterior pseudocapsule was incised and then elevated off the femur.  There was some metal stained tissue which was removed.  The liner was broken.  I subsequently dislocated the hip and removed the femoral head.  The femur was then retracted anteriorly to  gain acetabular exposure.  The femoral stem was in good condition and good position.  The acetabular retractors were placed.  We then were able to remove the liner from the acetabular shell.  The shell was a 54 mm pinnacle.  It was in good alignment and position was well fixed.  A new 54 mm x 32 mm inner diameter constrained liner was impacted into the acetabular shell.  A 32 +0 femoral head was placed.  The hip was reduced and locked into position.  The locking ring was then locked around the liner.  We placed the hip through range of motion.  It remained stable throughout.  The wound was then copiously irrigated with saline solution and the posterior pseudocapsule then reattached to the femur through drill holes with Ethibond suture. Fascia lata was closed over Hemovac drain with running #1 Stratafix suture.  Subcu was closed with interrupted 2-0 Vicryl and subcuticular running 4-0 Monocryl.  The drains hooked to suction.  Incision cleaned and dried, and a bulky sterile dressing was applied.  He was then awakened and transported to recovery in stable condition.  Note that a surgical assistant was a medical necessity for this procedure.  Surgical assistant  was necessary for retraction of vital neurovascular structures as well as proper positioning of the limb to remove the old prosthesis and place a new prosthesis.     Gaynelle Arabian, M.D.   ______________________________ Gaynelle Arabian, M.D.    FA/MEDQ  D:  10/25/2016  T:  10/25/2016  Job:  [id number]

## 2016-11-23 ENCOUNTER — Encounter (HOSPITAL_COMMUNITY): Payer: Self-pay | Admitting: Orthopedic Surgery

## 2016-11-23 NOTE — Addendum Note (Signed)
Addendum  created 11/23/16 0917 by Duane Boston, MD   Sign clinical note

## 2017-03-21 ENCOUNTER — Encounter (HOSPITAL_COMMUNITY): Payer: Self-pay | Admitting: *Deleted

## 2017-03-21 ENCOUNTER — Inpatient Hospital Stay (HOSPITAL_COMMUNITY)
Admission: EM | Admit: 2017-03-21 | Discharge: 2017-03-22 | DRG: 377 | Disposition: A | Discharge status: Home / Self Care | Payer: Medicaid Other | Attending: Family Medicine | Admitting: Family Medicine

## 2017-03-21 ENCOUNTER — Inpatient Hospital Stay (HOSPITAL_COMMUNITY): Payer: Medicaid Other

## 2017-03-21 DIAGNOSIS — F319 Bipolar disorder, unspecified: Secondary | ICD-10-CM | POA: Diagnosis present

## 2017-03-21 DIAGNOSIS — Z96641 Presence of right artificial hip joint: Secondary | ICD-10-CM | POA: Diagnosis present

## 2017-03-21 DIAGNOSIS — J449 Chronic obstructive pulmonary disease, unspecified: Secondary | ICD-10-CM | POA: Diagnosis present

## 2017-03-21 DIAGNOSIS — D62 Acute posthemorrhagic anemia: Secondary | ICD-10-CM | POA: Diagnosis present

## 2017-03-21 DIAGNOSIS — T39395A Adverse effect of other nonsteroidal anti-inflammatory drugs [NSAID], initial encounter: Secondary | ICD-10-CM

## 2017-03-21 DIAGNOSIS — K449 Diaphragmatic hernia without obstruction or gangrene: Secondary | ICD-10-CM | POA: Diagnosis present

## 2017-03-21 DIAGNOSIS — F1011 Alcohol abuse, in remission: Secondary | ICD-10-CM

## 2017-03-21 DIAGNOSIS — K210 Gastro-esophageal reflux disease with esophagitis: Secondary | ICD-10-CM | POA: Diagnosis present

## 2017-03-21 DIAGNOSIS — Z886 Allergy status to analgesic agent status: Secondary | ICD-10-CM

## 2017-03-21 DIAGNOSIS — K921 Melena: Secondary | ICD-10-CM | POA: Diagnosis present

## 2017-03-21 DIAGNOSIS — J438 Other emphysema: Secondary | ICD-10-CM | POA: Diagnosis not present

## 2017-03-21 DIAGNOSIS — K269 Duodenal ulcer, unspecified as acute or chronic, without hemorrhage or perforation: Secondary | ICD-10-CM | POA: Diagnosis not present

## 2017-03-21 DIAGNOSIS — K7030 Alcoholic cirrhosis of liver without ascites: Secondary | ICD-10-CM

## 2017-03-21 DIAGNOSIS — K861 Other chronic pancreatitis: Secondary | ICD-10-CM | POA: Diagnosis present

## 2017-03-21 DIAGNOSIS — B182 Chronic viral hepatitis C: Secondary | ICD-10-CM

## 2017-03-21 DIAGNOSIS — K922 Gastrointestinal hemorrhage, unspecified: Secondary | ICD-10-CM | POA: Insufficient documentation

## 2017-03-21 DIAGNOSIS — F418 Other specified anxiety disorders: Secondary | ICD-10-CM | POA: Diagnosis present

## 2017-03-21 DIAGNOSIS — D6489 Other specified anemias: Secondary | ICD-10-CM

## 2017-03-21 DIAGNOSIS — Z7982 Long term (current) use of aspirin: Secondary | ICD-10-CM

## 2017-03-21 DIAGNOSIS — N400 Enlarged prostate without lower urinary tract symptoms: Secondary | ICD-10-CM | POA: Diagnosis present

## 2017-03-21 DIAGNOSIS — G894 Chronic pain syndrome: Secondary | ICD-10-CM

## 2017-03-21 DIAGNOSIS — B1920 Unspecified viral hepatitis C without hepatic coma: Secondary | ICD-10-CM | POA: Diagnosis present

## 2017-03-21 DIAGNOSIS — F17210 Nicotine dependence, cigarettes, uncomplicated: Secondary | ICD-10-CM | POA: Diagnosis present

## 2017-03-21 DIAGNOSIS — Z880 Allergy status to penicillin: Secondary | ICD-10-CM

## 2017-03-21 DIAGNOSIS — Z87898 Personal history of other specified conditions: Secondary | ICD-10-CM

## 2017-03-21 DIAGNOSIS — G8929 Other chronic pain: Secondary | ICD-10-CM | POA: Diagnosis present

## 2017-03-21 DIAGNOSIS — E039 Hypothyroidism, unspecified: Secondary | ICD-10-CM | POA: Diagnosis present

## 2017-03-21 DIAGNOSIS — F1020 Alcohol dependence, uncomplicated: Secondary | ICD-10-CM | POA: Diagnosis present

## 2017-03-21 DIAGNOSIS — Z885 Allergy status to narcotic agent status: Secondary | ICD-10-CM

## 2017-03-21 DIAGNOSIS — Z881 Allergy status to other antibiotic agents status: Secondary | ICD-10-CM

## 2017-03-21 DIAGNOSIS — E41 Nutritional marasmus: Secondary | ICD-10-CM | POA: Diagnosis present

## 2017-03-21 DIAGNOSIS — K264 Chronic or unspecified duodenal ulcer with hemorrhage: Secondary | ICD-10-CM | POA: Diagnosis present

## 2017-03-21 DIAGNOSIS — F419 Anxiety disorder, unspecified: Secondary | ICD-10-CM | POA: Diagnosis present

## 2017-03-21 DIAGNOSIS — T39015A Adverse effect of aspirin, initial encounter: Secondary | ICD-10-CM | POA: Diagnosis present

## 2017-03-21 DIAGNOSIS — K7460 Unspecified cirrhosis of liver: Secondary | ICD-10-CM | POA: Diagnosis present

## 2017-03-21 DIAGNOSIS — Z79890 Hormone replacement therapy (postmenopausal): Secondary | ICD-10-CM

## 2017-03-21 DIAGNOSIS — K2970 Gastritis, unspecified, without bleeding: Secondary | ICD-10-CM | POA: Diagnosis not present

## 2017-03-21 DIAGNOSIS — Z79891 Long term (current) use of opiate analgesic: Secondary | ICD-10-CM

## 2017-03-21 DIAGNOSIS — Z79899 Other long term (current) drug therapy: Secondary | ICD-10-CM

## 2017-03-21 DIAGNOSIS — Y92009 Unspecified place in unspecified non-institutional (private) residence as the place of occurrence of the external cause: Secondary | ICD-10-CM | POA: Diagnosis not present

## 2017-03-21 DIAGNOSIS — D649 Anemia, unspecified: Secondary | ICD-10-CM | POA: Insufficient documentation

## 2017-03-21 DIAGNOSIS — Z681 Body mass index (BMI) 19 or less, adult: Secondary | ICD-10-CM | POA: Diagnosis not present

## 2017-03-21 LAB — COMPREHENSIVE METABOLIC PANEL
ALT: 17 U/L (ref 17–63)
AST: 24 U/L (ref 15–41)
Albumin: 3.7 g/dL (ref 3.5–5.0)
Alkaline Phosphatase: 48 U/L (ref 38–126)
Anion gap: 6 (ref 5–15)
BUN: 23 mg/dL — AB (ref 6–20)
CHLORIDE: 99 mmol/L — AB (ref 101–111)
CO2: 29 mmol/L (ref 22–32)
CREATININE: 0.73 mg/dL (ref 0.61–1.24)
Calcium: 10 mg/dL (ref 8.9–10.3)
Glucose, Bld: 110 mg/dL — ABNORMAL HIGH (ref 65–99)
POTASSIUM: 3.6 mmol/L (ref 3.5–5.1)
Sodium: 134 mmol/L — ABNORMAL LOW (ref 135–145)
Total Bilirubin: 0.4 mg/dL (ref 0.3–1.2)
Total Protein: 6.6 g/dL (ref 6.5–8.1)

## 2017-03-21 LAB — CBC
HEMATOCRIT: 24.3 % — AB (ref 39.0–52.0)
Hemoglobin: 8.3 g/dL — ABNORMAL LOW (ref 13.0–17.0)
MCH: 33.1 pg (ref 26.0–34.0)
MCHC: 34.2 g/dL (ref 30.0–36.0)
MCV: 96.8 fL (ref 78.0–100.0)
PLATELETS: 311 10*3/uL (ref 150–400)
RBC: 2.51 MIL/uL — AB (ref 4.22–5.81)
RDW: 12.1 % (ref 11.5–15.5)
WBC: 10.4 10*3/uL (ref 4.0–10.5)

## 2017-03-21 LAB — MRSA PCR SCREENING: MRSA BY PCR: NEGATIVE

## 2017-03-21 LAB — PROTIME-INR
INR: 0.95
Prothrombin Time: 12.6 seconds (ref 11.4–15.2)

## 2017-03-21 LAB — LIPASE, BLOOD: LIPASE: 28 U/L (ref 11–51)

## 2017-03-21 MED ORDER — PANTOPRAZOLE SODIUM 40 MG IV SOLR
40.0000 mg | Freq: Two times a day (BID) | INTRAVENOUS | Status: DC
  Filled 2017-03-21: qty 40

## 2017-03-21 MED ORDER — IOPAMIDOL (ISOVUE-300) INJECTION 61%
100.0000 mL | Freq: Once | INTRAVENOUS | Status: AC | PRN
  Administered 2017-03-21: 100 mL via INTRAVENOUS

## 2017-03-21 MED ORDER — ONDANSETRON HCL 4 MG/2ML IJ SOLN: 4.0000 mg | Freq: Four times a day (QID) | INTRAMUSCULAR | Status: DC | PRN

## 2017-03-21 MED ORDER — LEVOTHYROXINE SODIUM 88 MCG PO TABS
88.0000 ug | ORAL_TABLET | Freq: Every day | ORAL | Status: DC
  Administered 2017-03-21 – 2017-03-22 (×2): 88 ug via ORAL
  Filled 2017-03-21 (×2): qty 1

## 2017-03-21 MED ORDER — THIAMINE HCL 100 MG/ML IJ SOLN
INTRAMUSCULAR | Status: AC
  Filled 2017-03-21: qty 2

## 2017-03-21 MED ORDER — LAMOTRIGINE 100 MG PO TABS
100.0000 mg | ORAL_TABLET | Freq: Two times a day (BID) | ORAL | Status: DC
  Administered 2017-03-21 – 2017-03-22 (×2): 100 mg via ORAL
  Filled 2017-03-21 (×6): qty 1

## 2017-03-21 MED ORDER — SODIUM CHLORIDE 0.9 % IV SOLN
INTRAVENOUS | Status: DC
  Administered 2017-03-21: 17:00:00 via INTRAVENOUS

## 2017-03-21 MED ORDER — ALBUTEROL SULFATE (2.5 MG/3ML) 0.083% IN NEBU: 2.5000 mg | INHALATION_SOLUTION | Freq: Four times a day (QID) | RESPIRATORY_TRACT | Status: DC

## 2017-03-21 MED ORDER — SODIUM CHLORIDE 0.9 % IV BOLUS (SEPSIS)
1000.0000 mL | Freq: Once | INTRAVENOUS | Status: AC
  Administered 2017-03-21: 1000 mL via INTRAVENOUS

## 2017-03-21 MED ORDER — OCTREOTIDE ACETATE 500 MCG/ML IJ SOLN
INTRAMUSCULAR | Status: AC
  Filled 2017-03-21: qty 1

## 2017-03-21 MED ORDER — STERILE WATER FOR INJECTION IJ SOLN
INTRAMUSCULAR | Status: AC
  Administered 2017-03-21: 3 mL
  Filled 2017-03-21: qty 10

## 2017-03-21 MED ORDER — IPRATROPIUM BROMIDE 0.02 % IN SOLN: 0.5000 mg | Freq: Four times a day (QID) | RESPIRATORY_TRACT | Status: DC

## 2017-03-21 MED ORDER — M.V.I. ADULT IV INJ
INJECTION | Freq: Once | INTRAVENOUS | Status: AC
  Administered 2017-03-21: 22:00:00 via INTRAVENOUS
  Filled 2017-03-21: qty 1000

## 2017-03-21 MED ORDER — LITHIUM CARBONATE 300 MG PO CAPS
300.0000 mg | ORAL_CAPSULE | Freq: Two times a day (BID) | ORAL | Status: DC
Start: 2017-03-21 — End: 2017-03-22
  Administered 2017-03-21 – 2017-03-22 (×3): 300 mg via ORAL
  Filled 2017-03-21 (×7): qty 1

## 2017-03-21 MED ORDER — PANTOPRAZOLE SODIUM 40 MG IV SOLR
40.0000 mg | Freq: Two times a day (BID) | INTRAVENOUS | Status: DC
  Administered 2017-03-21 – 2017-03-22 (×2): 40 mg via INTRAVENOUS
  Filled 2017-03-21: qty 40

## 2017-03-21 MED ORDER — LORAZEPAM 2 MG/ML IJ SOLN
1.0000 mg | INTRAMUSCULAR | Status: DC | PRN
  Administered 2017-03-21: 2 mg via INTRAVENOUS
  Filled 2017-03-21: qty 1

## 2017-03-21 MED ORDER — CIPROFLOXACIN HCL 250 MG PO TABS
500.0000 mg | ORAL_TABLET | Freq: Two times a day (BID) | ORAL | Status: DC
  Administered 2017-03-21 – 2017-03-22 (×2): 500 mg via ORAL
  Filled 2017-03-21 (×2): qty 2

## 2017-03-21 MED ORDER — ONDANSETRON HCL 4 MG PO TABS: 4.0000 mg | ORAL_TABLET | Freq: Four times a day (QID) | ORAL | Status: DC | PRN

## 2017-03-21 MED ORDER — IOPAMIDOL (ISOVUE-300) INJECTION 61%
INTRAVENOUS | Status: AC
  Administered 2017-03-21: 30 mL via ORAL
  Filled 2017-03-21: qty 30

## 2017-03-21 MED ORDER — PANTOPRAZOLE SODIUM 40 MG IV SOLR
40.0000 mg | Freq: Once | INTRAVENOUS | Status: AC
  Administered 2017-03-21: 40 mg via INTRAVENOUS
  Filled 2017-03-21: qty 40

## 2017-03-21 MED ORDER — OXYCODONE HCL 5 MG PO TABS
10.0000 mg | ORAL_TABLET | ORAL | Status: DC | PRN
  Administered 2017-03-21 – 2017-03-22 (×6): 10 mg via ORAL
  Filled 2017-03-21 (×6): qty 2

## 2017-03-21 MED ORDER — METOCLOPRAMIDE HCL 5 MG/ML IJ SOLN
10.0000 mg | Freq: Once | INTRAMUSCULAR | Status: AC
  Administered 2017-03-22: 10 mg via INTRAVENOUS
  Filled 2017-03-21: qty 2

## 2017-03-21 MED ORDER — M.V.I. ADULT IV INJ
INJECTION | INTRAVENOUS | Status: AC
  Filled 2017-03-21: qty 10

## 2017-03-21 MED ORDER — IPRATROPIUM-ALBUTEROL 0.5-2.5 (3) MG/3ML IN SOLN
3.0000 mL | Freq: Four times a day (QID) | RESPIRATORY_TRACT | Status: DC
  Administered 2017-03-21 – 2017-03-22 (×4): 3 mL via RESPIRATORY_TRACT
  Filled 2017-03-21 (×4): qty 3

## 2017-03-21 MED ORDER — FAMOTIDINE IN NACL 20-0.9 MG/50ML-% IV SOLN
20.0000 mg | INTRAVENOUS | Status: AC
  Administered 2017-03-21: 20 mg via INTRAVENOUS
  Filled 2017-03-21: qty 50

## 2017-03-21 MED ORDER — FENTANYL CITRATE (PF) 100 MCG/2ML IJ SOLN
25.0000 ug | INTRAMUSCULAR | Status: DC | PRN
  Administered 2017-03-21 (×2): 25 ug via INTRAVENOUS
  Filled 2017-03-21 (×2): qty 2

## 2017-03-21 MED ORDER — FOLIC ACID 5 MG/ML IJ SOLN
INTRAMUSCULAR | Status: AC
  Filled 2017-03-21: qty 0.2

## 2017-03-21 MED ORDER — OCTREOTIDE LOAD VIA INFUSION
50.0000 ug | INTRAVENOUS | Status: AC
  Administered 2017-03-21: 50 ug via INTRAVENOUS
  Filled 2017-03-21: qty 25

## 2017-03-21 MED ORDER — NICOTINE 14 MG/24HR TD PT24
14.0000 mg | MEDICATED_PATCH | Freq: Every day | TRANSDERMAL | Status: DC
  Administered 2017-03-21 – 2017-03-22 (×2): 14 mg via TRANSDERMAL
  Filled 2017-03-21 (×2): qty 1

## 2017-03-21 MED ORDER — SODIUM CHLORIDE 0.9 % IV SOLN
50.0000 ug/h | INTRAVENOUS | Status: DC
  Administered 2017-03-21: 50 ug/h via INTRAVENOUS
  Filled 2017-03-21 (×7): qty 1

## 2017-03-21 NOTE — Progress Notes (Signed)
Called Dr. Oneida Alar and she is familiar with consults.

## 2017-03-21 NOTE — ED Provider Notes (Signed)
[facility] DEPT Provider Note   CSN: [id number] Arrival date & time: 03/21/17  1159     History   Chief Complaint Chief Complaint  Patient presents with  . GI Bleeding    HPI [name] is a 56 y.o. male.  HPI  The pt is a 56 y/o male with hx of bipolar d/c, as well as a hx of Cirrhosis - still drink 4 beers / day, COPD - followed by Dr. Luan Pulling - has had 1 week of stools that were initially black and have turned to a more bloody apearance over the last 3 days - he has had multiple stools per day which are bloody and states that he is surprised that he has not "messed myself" b/c he has had so many bloody stools.  No vomiting - diffuse abdominal pain is present.  No f/c.  He has ongoing chronic SOB which seems worse here recently as does his light headedness with standing.  No hx of colonoscopy or blood transfusion.  He is on O2 at night.  Past Medical History:  Diagnosis Date  . Anxiety   . Bipolar disorder ([place])   . BPH (benign prostatic hyperplasia)   . Cirrhosis ([place])   . COPD (chronic obstructive pulmonary disease) ([place])   . Depression   . Epicondylitis    hips  . ETOH abuse   . GERD (gastroesophageal reflux disease)   . Head injury   . Hepatitis C   . Hypothyroidism   . Oxygen decrease   . Pancreatitis chronic   . Shortness of breath     Patient Active Problem List   Diagnosis Date Noted  . GI bleeding 03/21/2017  . 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 Failed total hip arthroplasty, subsequent encounter 10/25/2016  . Alcohol abuse with alcohol-induced disorder ([place]) 04/16/2016  . Acute narcotic withdrawal with delirium ([place]) 11/12/2014  . H/O ETOH abuse 11/10/2014  . Encephalopathy acute 11/10/2014  . Chronic pain 09/10/2012  . Bipolar disorder ([place]) 09/10/2012  . Open wound, L leg 09/10/2012  . Fall 09/01/2012  . Fracture of ribs, right 11 & 12 08/30/2012  . Closed fracture of transverse process of lumbar vertebra  L2 & L3 08/30/2012  . Tibia/fibula fracture 08/30/2012  . Traumatic compartment syndrome of left leg 08/30/2012  . COPD (chronic obstructive pulmonary disease) ([place])   . Anxiety   . Hepatitis C 07/09/2012  . Hypothyroid 07/09/2012  . Constipation 03/17/2012  . Cirrhosis ([place]) 03/17/2012  . Chronic pancreatitis ([place]) 03/14/2008    Past Surgical History:  Procedure Laterality Date  . APPENDECTOMY  age 8  . COLONOSCOPY  07/22/2012   Procedure: COLONOSCOPY;  Surgeon: Rogene [place], MD;  Location: [facility];  Service: Endoscopy;  Laterality: N/A;  225-moved to 200 Ann notified pt  . FASCIOTOMY Left 08/30/2012   Procedure: FASCIOTOMY;  Surgeon: Johnn Hai, MD;  Location: [facility];  Service: Orthopedics;  Laterality: Left;  . FEMUR FRACTURE SURGERY     otif  . I&D EXTREMITY Left 09/03/2012   Procedure: IRRIGATION AND DEBRIDEMENT EXTREMITY;  Surgeon: Johnny Bridge, MD;  Location: [facility];  Service: Orthopedics;  Laterality: Left;  . SKIN SPLIT GRAFT Left 09/08/2012   Procedure:  SPLIT THICKNESS SKIN GRAFT LEFT LEG ;  Surgeon: Rozanna Box, MD;  Location: [facility];  Service: Orthopedics;  Laterality: Left;  . TOTAL HIP ARTHROPLASTY  1 year ago   right hip-[place]  . TOTAL HIP  REVISION Right 10/25/2016   Procedure: Revision right hip constrained liner;  Surgeon: Gaynelle Arabian, MD;  Location: [facility];  Service: Orthopedics;  Laterality: Right;  . TRANSURETHRAL RESECTION OF PROSTATE  05/23/2011   Procedure: TRANSURETHRAL RESECTION OF THE PROSTATE (TURP);  Surgeon: Marissa Nestle;  Location: [facility];  Service: Urology;  Laterality: N/A;  placement of suprapubic catheter       Home Medications    Prior to Admission medications   Medication Sig Start Date End Date Taking? Authorizing Provider  aspirin EC 325 MG EC tablet Take 1 tablet (325 mg total) by mouth daily with breakfast. 10/26/16   Gaynelle Arabian, MD  fentaNYL (DURAGESIC - DOSED MCG/HR) 100 MCG/HR Place 1 patch (100 mcg  total) onto the skin every other day. This dose was verified, it is correct Patient taking differently: Place 100 mcg onto the skin every other day.  11/12/14   Sinda Du, MD  lamoTRIgine (LAMICTAL) 100 MG tablet Take 1 tablet (100 mg total) by mouth 2 (two) times daily. 11/12/14   Sinda Du, MD  levothyroxine (SYNTHROID, LEVOTHROID) 88 MCG tablet Take 88 mcg by mouth daily.    [provider]  lithium carbonate 300 MG capsule Take 1 capsule (300 mg total) by mouth 2 (two) times daily with a meal. 11/12/14   Sinda Du, MD  LORazepam (ATIVAN) 1 MG tablet Take one every 6-8 hours as needed for shaking and anxiety Patient taking differently: Take 0.5 mg by mouth 2 (two) times daily.  11/13/14   Milton Ferguson, MD  methocarbamol (ROBAXIN) 500 MG tablet Take 1 tablet (500 mg total) by mouth every 6 (six) hours as needed for muscle spasms. 10/26/16   Gaynelle Arabian, MD  Multiple Vitamin (MULTIVITAMIN WITH MINERALS) TABS tablet Take 1 tablet by mouth daily.    [provider]  oxyCODONE (OXY IR/ROXICODONE) 5 MG immediate release tablet Take 2 tablets (10 mg total) by mouth every 3 (three) hours as needed for breakthrough pain. 10/26/16   Gaynelle Arabian, MD  OXYGEN Inhale 2.5 L into the lungs daily as needed (for breathing).     [provider]  traMADol (ULTRAM) 50 MG tablet Take 1-2 tablets (50-100 mg total) by mouth every 6 (six) hours as needed for moderate pain. 10/26/16   Gaynelle Arabian, MD    Family History Family History  Problem Relation Age of Onset  . Anesthesia problems Neg Hx   . Hypotension Neg Hx   . Pseudochol deficiency Neg Hx   . Malignant hyperthermia Neg Hx     Social History Social History  Substance Use Topics  . Smoking status: Current Every Day Smoker    Packs/day: 1.00    Years: 30.00    Types: Cigarettes  . Smokeless tobacco: Never Used  . Alcohol use 1.8 oz/week    3 Cans of beer per week     Comment: 3 beers per day      Allergies   Penicillins; Vancomycin; Codeine; and Tylenol [acetaminophen]   Review of Systems Review of Systems  Constitutional: Negative for fever.  Respiratory: Positive for cough, shortness of breath and wheezing.   Cardiovascular: Negative for chest pain and leg swelling.  Gastrointestinal: Positive for abdominal pain and blood in stool. Negative for rectal pain and vomiting.  All other systems reviewed and are negative.    Physical Exam Updated Vital Signs BP (!) 160/86 (BP Location: Left Arm)   Pulse 93   Temp 98.2 F (36.8 C) (Oral)  Resp 18   Ht 5\' 7"  (1.702 m)   Wt 56.7 kg (125 lb)   SpO2 100%   BMI 19.58 kg/m   Physical Exam  Constitutional: He appears well-developed and well-nourished. No distress.  HENT:  Head: Normocephalic and atraumatic.  Mouth/Throat: Oropharynx is clear and moist. No oropharyngeal exudate.  Eyes: Pupils are equal, round, and reactive to light. Conjunctivae and EOM are normal. Right eye exhibits no discharge. Left eye exhibits no discharge. No scleral icterus.  Neck: Normal range of motion. Neck supple. No JVD present. No thyromegaly present.  Cardiovascular: Normal rate, regular rhythm, normal heart sounds and intact distal pulses.  Exam reveals no gallop and no friction rub.   No murmur heard. Pulmonary/Chest: Effort normal. No respiratory distress. He has wheezes ( no distress but constant wheezing). He has no rales.  Abdominal: Soft. Bowel sounds are normal. He exhibits no distension and no mass. There is no tenderness.  Genitourinary:  Genitourinary Comments: Normal rectum, no masses, no fissues, no hemorrhoids- has melena / dark red bloodin vault. - grossly positive  Musculoskeletal: Normal range of motion. He exhibits no edema or tenderness.  Lymphadenopathy:    He has no cervical adenopathy.  Neurological: He is alert. Coordination normal.  Skin: Skin is warm and dry. No rash noted. No erythema.  Psychiatric: He has a  normal mood and affect. His behavior is normal.  Nursing note and vitals reviewed.    ED Treatments / Results  Labs (all labs ordered are listed, but only abnormal results are displayed) Labs Reviewed  COMPREHENSIVE METABOLIC PANEL - Abnormal; Notable for the following:       Result Value   Sodium 134 (*)    Chloride 99 (*)    Glucose, Bld 110 (*)    BUN 23 (*)    All other components within normal limits  CBC - Abnormal; Notable for the following:    RBC 2.51 (*)    Hemoglobin 8.3 (*)    HCT 24.3 (*)    All other components within normal limits  PROTIME-INR  POC OCCULT BLOOD, ED  TYPE AND SCREEN    Radiology No results found.  Procedures Procedures (including critical care time)  Medications Ordered in ED Medications  famotidine (PEPCID) IVPB 20 mg premix (20 mg Intravenous New Bag/Given 03/21/17 1434)  pantoprazole (PROTONIX) injection 40 mg (40 mg Intravenous Given 03/21/17 1433)  sodium chloride 0.9 % bolus 1,000 mL (1,000 mLs Intravenous New Bag/Given 03/21/17 1434)  sterile water (preservative free) injection (3 mLs  Given 03/21/17 1438)     Initial Impression / Assessment and Plan / ED Course  I have reviewed the triage vital signs and the nursing notes.  Pertinent labs & imaging results that were available during my care of the patient were reviewed by me and considered in my medical decision making (see chart for details).     Acute GI bleedin Anemia Has hx of cirrhosis complicating this Check CBC (Hgb of 8.3, check CMP - albumin of 3.7 and LFT's normal Renal function normal Consult with GI and hospitalist for admission.  D/w Dr. Oneida Alar as well as Dr. Wynetta Emery, the former consult, the latter admit. Appreciate specialist consultation and admission  Final Clinical Impressions(s) / ED Diagnoses   Final diagnoses:  GI bleed due to NSAIDs  Melena  Anemia due to other cause, not classified    New Prescriptions New Prescriptions   No medications on  file     Noemi Chapel, MD 03/21/17 1449

## 2017-03-21 NOTE — ED Notes (Signed)
Call to lab: re INR

## 2017-03-21 NOTE — Consult Note (Signed)
Referring Provider: No ref. provider found Primary Care Physician:  Hawkins, Edward, MD Primary Gastroenterologist:  Dr. Rehman  Date of Admission: 03/21/17 Date of Consultation: 03/21/17  Reason for Consultation:  Anemia, GI bleed  HPI:  Arvil J Fasching is a 56 y.o. male with a past medical history of anxiety, bipolar, BPH, cirrhosis, hepatitis C, GERD, alcohol abuse, hypothyroidism, chronic pancreatitis. Still drinks 4 beers a day. COPD followed by Dr. Hawkins pulmonology. One week his stools or black return to more bloody over the previous 3 days multiple stools a day which are bloody. No vomiting, diffuse abdominal pain. Chronic shortness of breath and seems worse in addition to new onset lightheadedness when standing. No history of colonoscopy or blood transfusion. Emergency room rectal exam revealed normal rectum, no masses, no fissures, no hemorrhoids but noted melena/dark stools which were heme-positive. Hemoglobin found to be 8.3. CMP found low sodium at 134, normal creatinine 0.73. Normal LFTs. INR 0.95.  Today he states he started seeing very dark/black stools a couple days ago and this has transitioned to red blood. Still drinking daily, has been taking ibuprofen "every 5-6 hours." (His sister pulled me aside in the hallway stating she lives with him and she saw a bottle of almost 200 Ibuprofen only lasted him a week.)  No other NSAIDs or ASA powders. Has mid to epigastric abdominal pain, some nausea and vomiting but denies hematemesis. Has some GERD symptoms and takes TUMS which has not helped. No other upper or lower GI symptoms. Is having worsening fatigue and dizziness with standing.  He has never had a colonoscopy. Last EGD "several years ago" per the patient. Review of records indicate he had a colonoscopy and EGD 03/09/2008. Colonoscopy found normal rectum, multiple polyps, poor prep. EGD found normal esophagus s/p Maloney dilation, small hiatal hernia, antral erosions, otherwise  normal. Surgical pathology found the polyps to be   Repeat colonoscopy 07/22/2012 which found two small anal papilla otherwise normal colon, recommend repeat exam in 5 years (06/2017)  Past Medical History:  Diagnosis Date  . Anxiety   . Bipolar disorder (HCC)   . BPH (benign prostatic hyperplasia)   . Cirrhosis (HCC)   . COPD (chronic obstructive pulmonary disease) (HCC)   . Depression   . Epicondylitis    hips  . ETOH abuse   . GERD (gastroesophageal reflux disease)   . Head injury   . Hepatitis C   . Hypothyroidism   . Oxygen decrease   . Pancreatitis chronic   . Shortness of breath     Past Surgical History:  Procedure Laterality Date  . APPENDECTOMY  age 10  . COLONOSCOPY  07/22/2012   Procedure: COLONOSCOPY;  Surgeon: Najeeb U Rehman, MD;  [facility];  Service: Endoscopy;  Laterality: N/A;  225-moved to 200 Ann notified pt  . FASCIOTOMY Left 08/30/2012   Procedure: FASCIOTOMY;  Surgeon: Jeffrey C Beane, MD;  Location: [facility];  Service: Orthopedics;  Laterality: Left;  . FEMUR FRACTURE SURGERY     otif  . I&D EXTREMITY Left 09/03/2012   Procedure: IRRIGATION AND DEBRIDEMENT EXTREMITY;  Surgeon: Joshua P Landau, MD;  Location: [facility];  Service: Orthopedics;  Laterality: Left;  . SKIN SPLIT GRAFT Left 09/08/2012   Procedure:  SPLIT THICKNESS SKIN GRAFT LEFT LEG ;  Surgeon: Michael H Handy, MD;  Location: [facility];  Service: Orthopedics;  Laterality: Left;  . TOTAL HIP ARTHROPLASTY  1 year ago   right hip-[place]  . TOTAL HIP   REVISION Right 10/25/2016   Procedure: Revision right hip constrained liner;  Surgeon: Gaynelle Arabian, MD;  [facility];  Service: Orthopedics;  Laterality: Right;  . TRANSURETHRAL RESECTION OF PROSTATE  05/23/2011   Procedure: TRANSURETHRAL RESECTION OF THE PROSTATE (TURP);  Surgeon: Marissa Nestle;  Location: [facility];  Service: Urology;  Laterality: N/A;  placement of suprapubic catheter    Prior to Admission medications   Medication  Sig Start Date End Date Taking? Authorizing Provider  aspirin EC 325 MG EC tablet Take 1 tablet (325 mg total) by mouth daily with breakfast. 10/26/16   Gaynelle Arabian, MD  fentaNYL (DURAGESIC - DOSED MCG/HR) 100 MCG/HR Place 1 patch (100 mcg total) onto the skin every other day. This dose was verified, it is correct Patient taking differently: Place 100 mcg onto the skin every other day.  11/12/14   Sinda Du, MD  lamoTRIgine (LAMICTAL) 100 MG tablet Take 1 tablet (100 mg total) by mouth 2 (two) times daily. 11/12/14   Sinda Du, MD  levothyroxine (SYNTHROID, LEVOTHROID) 88 MCG tablet Take 88 mcg by mouth daily.    [provider]  lithium carbonate 300 MG capsule Take 1 capsule (300 mg total) by mouth 2 (two) times daily with a meal. 11/12/14   Sinda Du, MD  LORazepam (ATIVAN) 1 MG tablet Take one every 6-8 hours as needed for shaking and anxiety Patient taking differently: Take 0.5 mg by mouth 2 (two) times daily.  11/13/14   Milton Ferguson, MD  methocarbamol (ROBAXIN) 500 MG tablet Take 1 tablet (500 mg total) by mouth every 6 (six) hours as needed for muscle spasms. 10/26/16   Gaynelle Arabian, MD  Multiple Vitamin (MULTIVITAMIN WITH MINERALS) TABS tablet Take 1 tablet by mouth daily.    [provider]  oxyCODONE (OXY IR/ROXICODONE) 5 MG immediate release tablet Take 2 tablets (10 mg total) by mouth every 3 (three) hours as needed for breakthrough pain. 10/26/16   Gaynelle Arabian, MD  OXYGEN Inhale 2.5 L into the lungs daily as needed (for breathing).     [provider]  traMADol (ULTRAM) 50 MG tablet Take 1-2 tablets (50-100 mg total) by mouth every 6 (six) hours as needed for moderate pain. 10/26/16   Gaynelle Arabian, MD    No current facility-administered medications for this encounter.    Current Outpatient Prescriptions  Medication Sig Dispense Refill  . aspirin EC 325 MG EC tablet Take 1 tablet (325 mg total) by mouth daily with breakfast. 30 tablet 0   . fentaNYL (DURAGESIC - DOSED MCG/HR) 100 MCG/HR Place 1 patch (100 mcg total) onto the skin every other day. This dose was verified, it is correct (Patient taking differently: Place 100 mcg onto the skin every other day. ) 5 patch 0  . lamoTRIgine (LAMICTAL) 100 MG tablet Take 1 tablet (100 mg total) by mouth 2 (two) times daily. 60 tablet 3  . levothyroxine (SYNTHROID, LEVOTHROID) 88 MCG tablet Take 88 mcg by mouth daily.    Marland Kitchen lithium carbonate 300 MG capsule Take 1 capsule (300 mg total) by mouth 2 (two) times daily with a meal. 60 capsule 3  . LORazepam (ATIVAN) 1 MG tablet Take one every 6-8 hours as needed for shaking and anxiety (Patient taking differently: Take 0.5 mg by mouth 2 (two) times daily. ) 30 tablet 0  . methocarbamol (ROBAXIN) 500 MG tablet Take 1 tablet (500 mg total) by mouth every 6 (six) hours as needed for muscle spasms. 80 tablet 0  .  Multiple Vitamin (MULTIVITAMIN WITH MINERALS) TABS tablet Take 1 tablet by mouth daily.    . oxyCODONE (OXY IR/ROXICODONE) 5 MG immediate release tablet Take 2 tablets (10 mg total) by mouth every 3 (three) hours as needed for breakthrough pain. 84 tablet 0  . OXYGEN Inhale 2.5 L into the lungs daily as needed (for breathing).     . traMADol (ULTRAM) 50 MG tablet Take 1-2 tablets (50-100 mg total) by mouth every 6 (six) hours as needed for moderate pain. 80 tablet 0    Allergies as of 03/21/2017 - Review Complete 03/21/2017  Allergen Reaction Noted  . Penicillins Anaphylaxis 05/10/2011  . Vancomycin  05/10/2011  . Codeine Hives 05/10/2011  . Tylenol [acetaminophen] Other (See Comments) 01/23/2014    Family History  Problem Relation Age of Onset  . Anesthesia problems Neg Hx   . Hypotension Neg Hx   . Pseudochol deficiency Neg Hx   . Malignant hyperthermia Neg Hx     Social History   Social History  . Marital status: Divorced    Spouse name: N/A  . Number of children: N/A  . Years of education: N/A   Occupational History  .  Not on file.   Social History Main Topics  . Smoking status: Current Every Day Smoker    Packs/day: 1.00    Years: 30.00    Types: Cigarettes  . Smokeless tobacco: Never Used  . Alcohol use 1.8 oz/week    3 Cans of beer per week     Comment: 3 beers per day  . Drug use: No     Comment: denies use 03/21/17  . Sexual activity: Not on file   Other Topics Concern  . Not on file   Social History Narrative  . No narrative on file    Review of Systems: General: Negative for anorexia, weight loss, fever, chills. Eyes: Negative for vision changes.  ENT: Negative for hoarseness, difficulty swallowing. CV: Negative for chest pain, angina, palpitations, peripheral edema.  Respiratory: Negative for dyspnea at rest, cough, sputum, wheezing.  GI: See history of present illness. Endo: Negative for unusual weight change.  Heme: Negative for bruising or bleeding. Allergy: Negative for rash or hives.  Physical Exam: Vital signs in last 24 hours: Temp:  [98.2 F (36.8 C)-98.4 F (36.9 C)] 98.2 F (36.8 C) (09/28 1431) Pulse Rate:  [83-102] 83 (09/28 1504) Resp:  [17-19] 17 (09/28 1504) BP: (102-160)/(63-86) 145/78 (09/28 1504) SpO2:  [98 %-100 %] 100 % (09/28 1504) Weight:  [125 lb (56.7 kg)] 125 lb (56.7 kg) (09/28 1216)   General:   Alert,  Well-developed, well-nourished, pleasant and cooperative in NAD Head:  Normocephalic and atraumatic. Eyes:  Sclera clear, no icterus. Conjunctiva pink. Ears:  Normal auditory acuity. Nose:  No deformity, discharge,  or lesions. Lungs:  Clear throughout to auscultation. No wheezes, crackles, or rhonchi. No acute distress. Heart:  Regular rate and rhythm; no murmurs, clicks, rubs, or gallops. Abdomen:  Soft, and nondistended. Mild to moderate epigastric to mid-abdominal TTP. No masses, hepatosplenomegaly or hernias noted. Normal bowel sounds, without guarding, and without rebound.   Rectal:  Deferred.   Msk:  Symmetrical without gross deformities.  Normal posture. Pulses:  Normal bilateral DP pulses noted. Extremities:  Without clubbing or edema. Neurologic:  Alert and oriented x4; grossly normal neurologically. Psych:  Alert and cooperative. Normal mood and affect.  Intake/Output from previous day: No intake/output data recorded. Intake/Output this shift: No intake/output data recorded.  Lab Results:    Recent Labs  03/21/17 1310  WBC 10.4  HGB 8.3*  HCT 24.3*  PLT 311   BMET  Recent Labs  03/21/17 1310  NA 134*  K 3.6  CL 99*  CO2 29  GLUCOSE 110*  BUN 23*  CREATININE 0.73  CALCIUM 10.0   LFT  Recent Labs  03/21/17 1310  PROT 6.6  ALBUMIN 3.7  AST 24  ALT 17  ALKPHOS 48  BILITOT 0.4   PT/INR  Recent Labs  03/21/17 1340  LABPROT 12.6  INR 0.95   Hepatitis Panel No results for input(s): HEPBSAG, HCVAB, HEPAIGM, HEPBIGM in the last 72 hours. C-Diff No results for input(s): CDIFFTOX in the last 72 hours.  Studies/Results: No results found.  Impression: 56 year old male with a history of Hep C Cirrhosis regularly seen by Dr. Laural Golden who presents with 2 days of melena and bright red blood. His liver functions appears compensated, normal platelet count which suggests NO portal hypertension (no recent abdominal imaging to suggest spleen size). He has been taking significant NSAIDs (from every 5-6 hours or as many as 200 in a week per the patient's sister). Additionally he continues to drink. Some nausea and vomiting but without hematemesis. History of chronic pancreatitis.  History of colon adenoma, TCS up to date (2014) and due in 4 months for a repeat exam.  GI bleed likely due to ETOH and NSAID injury: esophagitis, gastritis, duodenitis, PUD, duodenal ulcer. Less likely variceal bleed. Possible lower GI bleed due to NSAID ulceration, less likely CRC or inflammatory bowel disease.  **Will need propofol due to polypharmacy** (Home medications include Ultram, Ambien, Oxycodone,  fentanyl)  Plan: 1. Protonix 40 mg IV bid 2. Zofran q6 hours prn nausea 3. Pain management per hospitalist 4. Lipase 5. Follow hgb closely 6. Transfuse as necessary 7. Clear liquid diet 8. NPO after midnight 9. CT abdomen/pelvis with contrast tonight 10. Supportive measures 11. Will likely need endoscopic evaluation, will discuss EGD vs. EGD and colonoscopy with Dr. Oneida Alar for tomorrow on propofol/MAC    Thank you for allowing [place] to participate in the care of [place], DNP, AGNP-C Adult & Gerontological Nurse Practitioner [facility]    LOS: 0 days     03/21/2017, 3:18 PM

## 2017-03-21 NOTE — H&P (Signed)
History and Physical  [name] HDQ:[id number] DOB: 03-16-1961 DOA: 03/21/2017  Referring physician: Sabra Heck, MD PCP: Sinda Du, MD   Chief Complaint: GI bleeding  HPI: [name] is a 56 y.o. male with chronic alcoholism and cirrhosis, COPD and multiple other comorbidities detailed below who presented to the emergency department with 4 days of GI bleeding. The patient reports that he initially had black stools and maroon-colored stools that now have progressed to bright red stools. The patient has been fatigued. The patient reports that he feels dehydrated. The patient reports that he has no chest pain or shortness of breath.  He is now having multiple bloody stools per day. He has soiled his clothing with blood-tinged stool. He denies fever and chill. He has chronic shortness of breath and uses nighttime oxygen. He denies having history of colonoscopy and denies ever having a blood transfusion. He denies esophageal varices.  Severity of Illness: The appropriate patient status for this patient is INPATIENT. Inpatient status is judged to be reasonable and necessary in order to provide the required intensity of service to ensure the patient's safety. The patient's presenting symptoms, physical exam findings, and initial radiographic and laboratory data in the context of their chronic comorbidities is felt to place them at high risk for further clinical deterioration. Furthermore, it is not anticipated that the patient will be medically stable for discharge from the hospital within 2 midnights of admission. The following factors support the patient status of inpatient.   " The patient's presenting symptoms include rectal bleeding. " The worrisome physical exam findings include heme positive stool. " The initial radiographic and laboratory data are worrisome because of hemoglobin 8.6. " The chronic co-morbidities include cirrhosis of the liver and chronic alcohol abuse.   * I  certify that at the point of admission it is my clinical judgment that the patient will require inpatient hospital care spanning beyond 2 midnights from the point of admission due to high intensity of service, high risk for further deterioration and high frequency of surveillance required.*  Review of Systems: All systems reviewed and apart from history of presenting illness, are negative.  Past Medical History:  Diagnosis Date  . Anxiety   . Bipolar disorder ([place])   . BPH (benign prostatic hyperplasia)   . Cirrhosis ([place])   . COPD (chronic obstructive pulmonary disease) ([place])   . Depression   . Epicondylitis    hips  . ETOH abuse   . GERD (gastroesophageal reflux disease)   . Head injury   . Hepatitis C   . Hypothyroidism   . Oxygen decrease   . Pancreatitis chronic   . Shortness of breath    Past Surgical History:  Procedure Laterality Date  . APPENDECTOMY  age 76  . COLONOSCOPY  07/22/2012   Procedure: COLONOSCOPY;  Surgeon: Rogene Houston, MD;  [facility];  Service: Endoscopy;  Laterality: N/A;  225-moved to 200 Ann notified pt  . FASCIOTOMY Left 08/30/2012   Procedure: FASCIOTOMY;  Surgeon: Johnn Hai, MD;  [facility];  Service: Orthopedics;  Laterality: Left;  . FEMUR FRACTURE SURGERY     otif  . I&D EXTREMITY Left 09/03/2012   Procedure: IRRIGATION AND DEBRIDEMENT EXTREMITY;  Surgeon: Johnny Bridge, MD;  Location: [place];  Service: Orthopedics;  Laterality: Left;  . SKIN SPLIT GRAFT Left 09/08/2012   Procedure:  SPLIT THICKNESS SKIN GRAFT LEFT LEG ;  Surgeon: Rozanna Box, MD;  Location: [place];  Service: Orthopedics;  Laterality: Left;  . TOTAL HIP ARTHROPLASTY  1 year ago   right hip-[place]  . TOTAL HIP REVISION Right 10/25/2016   Procedure: Revision right hip constrained liner;  Surgeon: Gaynelle Arabian, MD;  [facility];  Service: Orthopedics;  Laterality: Right;  . TRANSURETHRAL RESECTION OF PROSTATE  05/23/2011   Procedure:  TRANSURETHRAL RESECTION OF THE PROSTATE (TURP);  Surgeon: Marissa Nestle;  Location: [facility];  Service: Urology;  Laterality: N/A;  placement of suprapubic catheter   Social History:  reports that he has been smoking Cigarettes.  He has a 30.00 pack-year smoking history. He has never used smokeless tobacco. He reports that he drinks about 1.8 oz of alcohol per week . He reports that he does not use drugs.  Allergies  Allergen Reactions  . Penicillins Anaphylaxis    PT STATES HE IS NOT ALLERGIC TO PENICILLIN AND HAS NEVER HAD A REACTION   . Vancomycin     redman syndrome  . Codeine Hives  . Tylenol [Acetaminophen] Other (See Comments)    Patient has hepatitis    Family History  Problem Relation Age of Onset  . Anesthesia problems Neg Hx   . Hypotension Neg Hx   . Pseudochol deficiency Neg Hx   . Malignant hyperthermia Neg Hx     Prior to Admission medications   Medication Sig Start Date End Date Taking? Authorizing Provider  aspirin EC 325 MG EC tablet Take 1 tablet (325 mg total) by mouth daily with breakfast. 10/26/16   Gaynelle Arabian, MD  fentaNYL (DURAGESIC - DOSED MCG/HR) 100 MCG/HR Place 1 patch (100 mcg total) onto the skin every other day. This dose was verified, it is correct Patient taking differently: Place 100 mcg onto the skin every other day.  11/12/14   Sinda Du, MD  lamoTRIgine (LAMICTAL) 100 MG tablet Take 1 tablet (100 mg total) by mouth 2 (two) times daily. 11/12/14   Sinda Du, MD  levothyroxine (SYNTHROID, LEVOTHROID) 88 MCG tablet Take 88 mcg by mouth daily.    [provider]  lithium carbonate 300 MG capsule Take 1 capsule (300 mg total) by mouth 2 (two) times daily with a meal. 11/12/14   Sinda Du, MD  LORazepam (ATIVAN) 1 MG tablet Take one every 6-8 hours as needed for shaking and anxiety Patient taking differently: Take 0.5 mg by mouth 2 (two) times daily.  11/13/14   Milton Ferguson, MD  methocarbamol (ROBAXIN) 500 MG tablet Take 1  tablet (500 mg total) by mouth every 6 (six) hours as needed for muscle spasms. 10/26/16   Gaynelle Arabian, MD  Multiple Vitamin (MULTIVITAMIN WITH MINERALS) TABS tablet Take 1 tablet by mouth daily.    [provider]  oxyCODONE (OXY IR/ROXICODONE) 5 MG immediate release tablet Take 2 tablets (10 mg total) by mouth every 3 (three) hours as needed for breakthrough pain. 10/26/16   Gaynelle Arabian, MD  OXYGEN Inhale 2.5 L into the lungs daily as needed (for breathing).     [provider]  traMADol (ULTRAM) 50 MG tablet Take 1-2 tablets (50-100 mg total) by mouth every 6 (six) hours as needed for moderate pain. 10/26/16   Gaynelle Arabian, MD   Physical Exam: Vitals:   03/21/17 1216 03/21/17 1431  BP: 133/74 (!) 160/86  Pulse: (!) 102 93  Resp: 18 18  Temp: 98.4 F (36.9 C) 98.2 F (36.8 C)  TempSrc: Oral Oral  SpO2: 98% 100%  Weight: 56.7 kg (125 lb)  Height: 5\' 7"  (1.702 m)     General exam: Moderately built and nourished patient, lying comfortably supine on the gurney in no obvious distress.  Head, eyes and ENT: Nontraumatic and normocephalic. Pupils equally reacting to light and accommodation. Oral mucosa dry and pale.  Neck: Supple. No JVD, carotid bruit or thyromegaly.  Lymphatics: No lymphadenopathy.  Respiratory system: bilateral exp wheezing. No increased work of breathing.  Cardiovascular system: S1 and S2 heard, RRR. No JVD, murmurs, gallops, clicks or pedal edema.  Gastrointestinal system: Abdomen is nondistended, soft and mild epigastric tenderness. Normal bowel sounds heard. No organomegaly or masses appreciated.  Central nervous system: Alert and oriented. No focal neurological deficits.  Extremities: Symmetric 5 x 5 power. Peripheral pulses symmetrically felt.   Skin: No rashes or acute findings.  Musculoskeletal system: Negative exam.  Psychiatry: Pleasant and cooperative.  Labs on Admission:  Basic Metabolic Panel:  Recent Labs Lab  03/21/17 1310  NA 134*  K 3.6  CL 99*  CO2 29  GLUCOSE 110*  BUN 23*  CREATININE 0.73  CALCIUM 10.0   Liver Function Tests:  Recent Labs Lab 03/21/17 1310  AST 24  ALT 17  ALKPHOS 48  BILITOT 0.4  PROT 6.6  ALBUMIN 3.7   No results for input(s): LIPASE, AMYLASE in the last 168 hours. No results for input(s): AMMONIA in the last 168 hours. CBC:  Recent Labs Lab 03/21/17 1310  WBC 10.4  HGB 8.3*  HCT 24.3*  MCV 96.8  PLT 311   Cardiac Enzymes: No results for input(s): CKTOTAL, CKMB, CKMBINDEX, TROPONINI in the last 168 hours.  BNP (last 3 results) No results for input(s): PROBNP in the last 8760 hours. CBG: No results for input(s): GLUCAP in the last 168 hours.  Radiological Exams on Admission: No results found.  EKG: Independently reviewed.   Assessment/Plan Principal Problem:   GI bleeding Active Problems:   Chronic pancreatitis (HCC)   Cirrhosis (HCC)   Hepatitis C   Hypothyroid   COPD (chronic obstructive pulmonary disease) (HCC)   Chronic pain   Bipolar disorder ([place])   H/O ETOH abuse   1. Acute GI bleeding - he is likely having upper GI bleeding and I'm concerned about esophageal varices. GI has been consult and will see the patient. Admitted to stepdown unit. IV Protonix ordered keep nothing by mouth for now. 2. Chronic alcoholism-alcohol withdrawal protocol ordered. Lorazepam as needed. 3. Hypothyroidism-resume home levothyroxine dose. 4. Bipolar disorder-resume home antipsychotic medications. 5. COPD-stable, continue supplemental oxygen and scheduled nebulizer treatments. 6. Chronic pain-IV pain medications ordered as needed. 7. Acute blood loss anemia-follow hemoglobin closely, type and screen and transfuse if needed.  DVT Prophylaxis: SCDs Code Status: FULL   Family Communication: wife bedside  Disposition Plan: TBD   Time spent: 45 mins   Irwin Brakeman, MD [facility] Pager [phone]  If 7PM-7AM, please contact  night-coverage [email or web address] Password [email or web address] 03/21/2017, 2:50 PM

## 2017-03-21 NOTE — ED Triage Notes (Signed)
Pt with blood in stool since Monday, bright red in color.  N/V since Monday.

## 2017-03-21 NOTE — ED Notes (Signed)
Pt  Reports dark tarry stools for 1 week-   Bright red stools yesterday  Admits to 4 beers daily

## 2017-03-22 ENCOUNTER — Encounter (HOSPITAL_COMMUNITY)
Admission: EM | Disposition: A | Discharge status: Home / Self Care | Payer: Self-pay | Source: Home / Self Care | Attending: Family Medicine

## 2017-03-22 ENCOUNTER — Encounter (HOSPITAL_COMMUNITY): Payer: Self-pay | Admitting: *Deleted

## 2017-03-22 ENCOUNTER — Inpatient Hospital Stay (HOSPITAL_COMMUNITY): Payer: Medicaid Other | Admitting: Anesthesiology

## 2017-03-22 ENCOUNTER — Telehealth: Payer: Self-pay | Admitting: Gastroenterology

## 2017-03-22 DIAGNOSIS — K2970 Gastritis, unspecified, without bleeding: Secondary | ICD-10-CM

## 2017-03-22 DIAGNOSIS — K269 Duodenal ulcer, unspecified as acute or chronic, without hemorrhage or perforation: Secondary | ICD-10-CM

## 2017-03-22 HISTORY — PX: ESOPHAGOGASTRODUODENOSCOPY: SHX5428

## 2017-03-22 HISTORY — PX: BIOPSY: SHX5522

## 2017-03-22 LAB — CBC
HEMATOCRIT: 19.5 % — AB (ref 39.0–52.0)
HEMOGLOBIN: 6.6 g/dL — AB (ref 13.0–17.0)
MCH: 33.3 pg (ref 26.0–34.0)
MCHC: 33.8 g/dL (ref 30.0–36.0)
MCV: 98.5 fL (ref 78.0–100.0)
Platelets: 237 10*3/uL (ref 150–400)
RBC: 1.98 MIL/uL — AB (ref 4.22–5.81)
RDW: 12.5 % (ref 11.5–15.5)
WBC: 8 10*3/uL (ref 4.0–10.5)

## 2017-03-22 LAB — COMPREHENSIVE METABOLIC PANEL
ALK PHOS: 37 U/L — AB (ref 38–126)
ALT: 13 U/L — AB (ref 17–63)
ANION GAP: 5 (ref 5–15)
AST: 20 U/L (ref 15–41)
Albumin: 2.8 g/dL — ABNORMAL LOW (ref 3.5–5.0)
BILIRUBIN TOTAL: 0.4 mg/dL (ref 0.3–1.2)
BUN: 15 mg/dL (ref 6–20)
CALCIUM: 8 mg/dL — AB (ref 8.9–10.3)
CO2: 26 mmol/L (ref 22–32)
CREATININE: 0.83 mg/dL (ref 0.61–1.24)
Chloride: 105 mmol/L (ref 101–111)
GFR calc non Af Amer: >60 mL/min (ref >60)
GLUCOSE: 142 mg/dL — AB (ref 65–99)
Potassium: 3.8 mmol/L (ref 3.5–5.1)
Sodium: 136 mmol/L (ref 135–145)
Total Protein: 5.2 g/dL — ABNORMAL LOW (ref 6.5–8.1)

## 2017-03-22 LAB — HIV ANTIBODY (ROUTINE TESTING W REFLEX): HIV Screen 4th Generation wRfx: NONREACTIVE

## 2017-03-22 LAB — PREPARE RBC (CROSSMATCH)

## 2017-03-22 LAB — ABO/RH: ABO/RH(D): O NEG

## 2017-03-22 SURGERY — EGD (ESOPHAGOGASTRODUODENOSCOPY)
Anesthesia: Monitor Anesthesia Care

## 2017-03-22 MED ORDER — PANTOPRAZOLE SODIUM 40 MG PO TBEC
40.0000 mg | DELAYED_RELEASE_TABLET | Freq: Two times a day (BID) | ORAL | Status: DC
  Administered 2017-03-22: 40 mg via ORAL
  Filled 2017-03-22 (×2): qty 1

## 2017-03-22 MED ORDER — LEVOTHYROXINE SODIUM 88 MCG PO TABS: 88.0000 ug | ORAL_TABLET | Freq: Every day | ORAL | 0 refills | Status: AC

## 2017-03-22 MED ORDER — LIDOCAINE VISCOUS 2 % MT SOLN
15.0000 mL | Freq: Once | OROMUCOSAL | Status: AC
  Administered 2017-03-22: 5 mL via OROMUCOSAL

## 2017-03-22 MED ORDER — LORAZEPAM 1 MG PO TABS: 0.5000 mg | ORAL_TABLET | Freq: Two times a day (BID) | ORAL | Status: DC

## 2017-03-22 MED ORDER — LACTATED RINGERS IV SOLN
INTRAVENOUS | Status: DC | PRN
  Administered 2017-03-22: 13:00:00 via INTRAVENOUS

## 2017-03-22 MED ORDER — PROPOFOL 10 MG/ML IV BOLUS: INTRAVENOUS | Status: DC | PRN

## 2017-03-22 MED ORDER — SODIUM CHLORIDE 0.9 % IV SOLN
INTRAVENOUS | Status: DC | PRN
  Administered 2017-03-22: 13:00:00 via INTRAVENOUS

## 2017-03-22 MED ORDER — LIDOCAINE VISCOUS 2 % MT SOLN
OROMUCOSAL | Status: AC
  Filled 2017-03-22: qty 15

## 2017-03-22 MED ORDER — LAMOTRIGINE 100 MG PO TABS: 100.0000 mg | ORAL_TABLET | Freq: Two times a day (BID) | ORAL | 0 refills | Status: AC

## 2017-03-22 MED ORDER — PANTOPRAZOLE SODIUM 40 MG PO TBEC
40.0000 mg | DELAYED_RELEASE_TABLET | Freq: Two times a day (BID) | ORAL | 1 refills | Status: DC
Start: 2017-03-22 — End: 2017-07-10

## 2017-03-22 MED ORDER — PROPOFOL 10 MG/ML IV BOLUS
INTRAVENOUS | Status: DC | PRN
  Administered 2017-03-22 (×3): 20 mg via INTRAVENOUS

## 2017-03-22 MED ORDER — LITHIUM CARBONATE 300 MG PO CAPS: 300.0000 mg | ORAL_CAPSULE | Freq: Two times a day (BID) | ORAL | 0 refills | Status: DC

## 2017-03-22 MED ORDER — SODIUM CHLORIDE 0.9 % IV SOLN: INTRAVENOUS | Status: DC

## 2017-03-22 MED ORDER — PROPOFOL 500 MG/50ML IV EMUL
INTRAVENOUS | Status: DC | PRN
  Administered 2017-03-22: 50 ug/kg/min via INTRAVENOUS

## 2017-03-22 MED ORDER — SODIUM CHLORIDE 0.9 % IV SOLN
Freq: Once | INTRAVENOUS | Status: AC
  Administered 2017-03-22: 09:00:00 via INTRAVENOUS

## 2017-03-22 NOTE — Progress Notes (Signed)
03/22/2017 5:05 PM  Pt asking for 30 day supply of medication.  He needs to follow up with his PCP for refills of any controlled substances. It is not appropriate for the hospitalist to refill controlled substances esp for 30 days and with his history of alcohol abuse I would be very concerned with refilling these outside of a primary care environment.    [place] [employer]

## 2017-03-22 NOTE — Interval H&P Note (Signed)
History and Physical Interval Note:  03/22/2017 12:09 PM  [name]  has presented today for surgery, with the diagnosis of MELENA  The various methods of treatment have been discussed with the patient and family. After consideration of risks, benefits and other options for treatment, the patient has consented to  Procedure(s): ESOPHAGOGASTRODUODENOSCOPY (EGD) (N/A) ESOPHAGEAL BANDING (N/A) as a surgical intervention .  The patient's history has been reviewed, patient examined, no change in status, stable for surgery.  I have reviewed the patient's chart and labs.  Questions were answered to the patient's satisfaction.     [employer]

## 2017-03-22 NOTE — Addendum Note (Signed)
Addendum  created 03/22/17 1506 by Vista Deck, CRNA   Delete clinical note, Sign clinical note

## 2017-03-22 NOTE — Transfer of Care (Deleted)
Immediate Anesthesia Transfer of Care Note  Patient: [name]  Procedure(s) Performed: Procedure(s): ESOPHAGOGASTRODUODENOSCOPY (EGD) (N/A) ESOPHAGEAL BANDING (N/A)  Patient Location: PACU  Anesthesia Type:MAC  Level of Consciousness: awake and alert   Airway & Oxygen Therapy: Patient Spontanous Breathing  Post-op Assessment: Report given to RN and Post -op Vital signs reviewed and stable  Post vital signs: Reviewed and stable  Last Vitals:  Vitals:   03/22/17 1208 03/22/17 1229  BP: 97/67 105/65  Pulse:  73  Resp: 11 12  Temp: 36.7 C 36.7 C  SpO2:  98%    Last Pain:  Vitals:   03/22/17 1243  TempSrc:   PainSc: 0-No pain      Patients Stated Pain Goal: 9 (62/37/62 8315)  Complications: No apparent anesthesia complications

## 2017-03-22 NOTE — Telephone Encounter (Signed)
PT ADMITTED WITH MELENA AND UGIB. EGD SHOWS PUD DUE TO IBUPROFEN. NEEDS OPV IN 4 MOS E30 NUR/TS.

## 2017-03-22 NOTE — Anesthesia Procedure Notes (Addendum)
Procedure Name: MAC Date/Time: 03/22/2017 12:29 PM Performed by: Vista Deck Pre-anesthesia Checklist: Patient identified, Emergency Drugs available, Suction available, Timeout performed and Patient being monitored Patient Re-evaluated:Patient Re-evaluated prior to induction Oxygen Delivery Method: Non-rebreather mask

## 2017-03-22 NOTE — Discharge Instructions (Signed)
PLEASE AVOID ALL NSAIDS FOR AT LEAST 2 WEEKS. TAKE PROTONIX TWICE DAILY FOR 3 MONTHS THEN ONCE DAILY   Follow with Primary MD  Sinda Du, MD  and other consultant's as instructed your Hospitalist MD  Please get a complete blood count and chemistry panel checked by your Primary MD at your next visit, and again as instructed by your Primary MD.  Get Medicines reviewed and adjusted: Please take all your medications with you for your next visit with your Primary MD  Laboratory/radiological data: Please request your Primary MD to go over all hospital tests and procedure/radiological results at the follow up, please ask your Primary MD to get all Hospital records sent to his/her office.  In some cases, they will be blood work, cultures and biopsy results pending at the time of your discharge. Please request that your primary care M.D. follows up on these results.  Also Note the following: If you experience worsening of your admission symptoms, develop shortness of breath, life threatening emergency, suicidal or homicidal thoughts you must seek medical attention immediately by calling 911 or calling your MD immediately  if symptoms less severe.  You must read complete instructions/literature along with all the possible adverse reactions/side effects for all the Medicines you take and that have been prescribed to you. Take any new Medicines after you have completely understood and accpet all the possible adverse reactions/side effects.   Do not drive when taking Pain medications or sleeping medications (Benzodaizepines)  Do not take more than prescribed Pain, Sleep and Anxiety Medications. It is not advisable to combine anxiety,sleep and pain medications without talking with your primary care practitioner  Special Instructions: If you have smoked or chewed Tobacco  in the last 2 yrs please stop smoking, stop any regular Alcohol  and or any Recreational drug use.  Wear Seat belts while  driving.  Please note: You were cared for by a hospitalist during your hospital stay. Once you are discharged, your primary care physician will handle any further medical issues. Please note that NO REFILLS for any discharge medications will be authorized once you are discharged, as it is imperative that you return to your primary care physician (or establish a relationship with a primary care physician if you do not have one) for your post hospital discharge needs so that they can reassess your need for medications and monitor your lab values.

## 2017-03-22 NOTE — Progress Notes (Signed)
PROGRESS NOTE    HENRYK URSIN  XBD:[id number]  DOB: 07/05/1960  DOA: 03/21/2017 PCP: Sinda Du, MD   Brief Admission Hx: [name] is a 56 y.o. male with chronic alcoholism and cirrhosis, COPD and multiple other comorbidities detailed below who presented to the emergency department with 4 days of GI bleeding.   MDM/Assessment & Plan:   1. Acute Upper GI bleeding - he is likely having upper GI bleeding and I'm concerned about esophageal varices. GI has been consulted and planning endoscopy later today.  Given drop in Hg, will transfuse 2 units PRBC.   Follow in stepdown unit. IV Protonix and octreotide ordered keep nothing by mouth for now.  2. Chronic alcoholism-alcohol withdrawal protocol ordered. Lorazepam as needed. 3. Hypothyroidism-resume home levothyroxine dose. 4. Bipolar disorder-resume home antipsychotic medications. 5. COPD-stable, continue supplemental oxygen and scheduled nebulizer treatments. 6. Chronic pain-IV pain medications ordered as needed. 7. Acute blood loss anemia-Transfuse 2 units PRBC.  DVT Prophylaxis: SCDs Code Status: FULL   Family Communication: wife bedside  Disposition Plan: TBD   Consultants:  GI  Procedures:  Pending for 9/29   Subjective: Pt without complaints this morning.  No chest pain, no SOB.    Objective: Vitals:   03/22/17 0200 03/22/17 0354 03/22/17 0400 03/22/17 0708  BP: 99/62     Pulse: 73   74  Resp: 16   19  Temp:   97.9 F (36.6 C) 97.7 F (36.5 C)  TempSrc:   Oral Oral  SpO2: 100% 100%  100%  Weight:   55.8 kg (123 lb 0.3 oz)   Height:        Intake/Output Summary (Last 24 hours) at 03/22/17 8341 Last data filed at 03/22/17 0300  Gross per 24 hour  Intake            717.5 ml  Output              300 ml  Net            417.5 ml   Filed Weights   03/21/17 1216 03/21/17 1641 03/22/17 0400  Weight: 56.7 kg (125 lb) 54.2 kg (119 lb 7.8 oz) 55.8 kg (123 lb 0.3 oz)     REVIEW OF SYSTEMS  As  per history otherwise all reviewed and reported negative  Exam:  General exam: chronically ill appearing, emaciated. Pale.  Respiratory system: Clear. No increased work of breathing. Cardiovascular system: S1 & S2 heard.  No JVD, murmurs, gallops, clicks or pedal edema. Gastrointestinal system: Abdomen is nondistended, soft and mild nonspecific tenderness. Normal bowel sounds heard. Central nervous system: Alert and oriented. No focal neurological deficits. Extremities: no CCE.  Data Reviewed: Basic Metabolic Panel:  Recent Labs Lab 03/21/17 1310 03/22/17 0422  NA 134* 136  K 3.6 3.8  CL 99* 105  CO2 29 26  GLUCOSE 110* 142*  BUN 23* 15  CREATININE 0.73 0.83  CALCIUM 10.0 8.0*   Liver Function Tests:  Recent Labs Lab 03/21/17 1310 03/22/17 0422  AST 24 20  ALT 17 13*  ALKPHOS 48 37*  BILITOT 0.4 0.4  PROT 6.6 5.2*  ALBUMIN 3.7 2.8*    Recent Labs Lab 03/21/17 1602  LIPASE 28   No results for input(s): AMMONIA in the last 168 hours. CBC:  Recent Labs Lab 03/21/17 1310 03/22/17 0422  WBC 10.4 8.0  HGB 8.3* 6.6*  HCT 24.3* 19.5*  MCV 96.8 98.5  PLT 311 237   Cardiac Enzymes: No results  for input(s): CKTOTAL, CKMB, CKMBINDEX, TROPONINI in the last 168 hours. CBG (last 3)  No results for input(s): GLUCAP in the last 72 hours. Recent Results (from the past 240 hour(s))  MRSA PCR Screening     Status: None   Collection Time: 03/21/17  4:45 PM  Result Value Ref Range Status   MRSA by PCR NEGATIVE NEGATIVE Final    Comment:        The GeneXpert MRSA Assay (FDA approved for NASAL specimens only), is one component of a comprehensive MRSA colonization surveillance program. It is not intended to diagnose MRSA infection nor to guide or monitor treatment for MRSA infections.      Studies: Ct Abdomen Pelvis W Contrast  Result Date: 03/21/2017 CLINICAL DATA:  Unintended weight loss EXAM: CT ABDOMEN AND PELVIS WITH CONTRAST TECHNIQUE: Multidetector CT  imaging of the abdomen and pelvis was performed using the standard protocol following bolus administration of intravenous contrast. CONTRAST:  32mL ISOVUE-300 IOPAMIDOL (ISOVUE-300) INJECTION 61%, 180mL ISOVUE-300 IOPAMIDOL (ISOVUE-300) INJECTION 61% COMPARISON:  11/03/2014 FINDINGS: Lower chest: Lung bases demonstrate no acute consolidation or effusion. Normal heart size. Hepatobiliary: Subcentimeter hypodensity within the anterior liver, too small to further characterize. Surgical absence of the gallbladder. No biliary dilatation Pancreas: Unremarkable. No pancreatic ductal dilatation or surrounding inflammatory changes. Spleen: Normal in size without focal abnormality. Adrenals/Urinary Tract: Adrenal glands are within normal limits. 15 mm intermediate density exophytic lesion upper pole left kidney. Other subcentimeter cortical hypodense lesions in the left kidney too small to further characterize. Bladder obscured by artifact Stomach/Bowel: The stomach is nonenlarged. Mild wall thickening and surrounding inflammatory changes at the duodenum bulb and second portion of the duodenum. No evidence for a bowel obstruction. No colon wall thickening. Appendix not visualized consistent with history of appendectomy Vascular/Lymphatic: Aortic atherosclerosis. No aneurysmal dilatation. Nonspecific retroperitoneal subcentimeter lymph nodes. Reproductive: Prostate is unremarkable. Other: Negative for free air or free fluid. Musculoskeletal: Status post right hip replacement with artifact. No acute or suspicious bone lesion. IMPRESSION: 1. Wall thickening and surrounding inflammatory changes at the duodenal bulb and second portion of duodenum suggesting a duodenitis or possible ulcer disease. No evidence for a perforation. Recommend correlation with endoscopy/direct visualization. 2. Intermediate density exophytic 15 mm lesion upper pole left kidney. When the patient is clinically stable and able to follow directions and hold  their breath (preferably as an outpatient) further evaluation with dedicated abdominal MRI should be considered. Electronically Signed   By: Donavan Foil M.D.   On: 03/21/2017 21:31     Scheduled Meds: . ciprofloxacin  500 mg Oral BID  . ipratropium-albuterol  3 mL Nebulization Q6H  . lamoTRIgine  100 mg Oral BID  . levothyroxine  88 mcg Oral QAC breakfast  . lithium carbonate  300 mg Oral BID WC  . metoCLOPramide (REGLAN) injection  10 mg Intravenous Once  . nicotine  14 mg Transdermal Daily  . pantoprazole (PROTONIX) IV  40 mg Intravenous Q12H  . pantoprazole (PROTONIX) IV  40 mg Intravenous Q12H   Continuous Infusions: . sodium chloride 60 mL/hr at 03/21/17 1720  . sodium chloride    . octreotide  (SANDOSTATIN)    IV infusion 50 mcg/hr (03/21/17 2230)    Principal Problem:   GI bleeding Active Problems:   Chronic pancreatitis (HCC)   Cirrhosis (HCC)   Hepatitis C   Hypothyroid   COPD (chronic obstructive pulmonary disease) (HCC)   Chronic pain   Bipolar disorder ([place])   H/O ETOH abuse  Critical Care Time spent: 44 mins  Irwin Brakeman, MD, FAAFP [facility] Pager [phone]  If 7PM-7AM, please contact night-coverage [email or web address] Password [email or web address] 03/22/2017, 7:28 AM    LOS: 1 day

## 2017-03-22 NOTE — H&P (View-Only) (Signed)
Referring Provider: No ref. provider found Primary Care Physician:  Sinda Du, MD Primary Gastroenterologist:  Dr. Laural Golden  Date of Admission: 03/21/17 Date of Consultation: 03/21/17  Reason for Consultation:  Anemia, GI bleed  HPI:  [name] is a 56 y.o. male with a past medical history of anxiety, bipolar, BPH, cirrhosis, hepatitis C, GERD, alcohol abuse, hypothyroidism, chronic pancreatitis. Still drinks 4 beers a day. COPD followed by Dr. Luan Pulling pulmonology. One week his stools or black return to more bloody over the previous 3 days multiple stools a day which are bloody. No vomiting, diffuse abdominal pain. Chronic shortness of breath and seems worse in addition to new onset lightheadedness when standing. No history of colonoscopy or blood transfusion. Emergency room rectal exam revealed normal rectum, no masses, no fissures, no hemorrhoids but noted melena/dark stools which were heme-positive. Hemoglobin found to be 8.3. CMP found low sodium at 134, normal creatinine 0.73. Normal LFTs. INR 0.95.  Today he states he started seeing very dark/black stools a couple days ago and this has transitioned to red blood. Still drinking daily, has been taking ibuprofen "every 5-6 hours." (His sister pulled me aside in the hallway stating she lives with him and she saw a bottle of almost 200 Ibuprofen only lasted him a week.)  No other NSAIDs or ASA powders. Has mid to epigastric abdominal pain, some nausea and vomiting but denies hematemesis. Has some GERD symptoms and takes TUMS which has not helped. No other upper or lower GI symptoms. Is having worsening fatigue and dizziness with standing.  He has never had a colonoscopy. Last EGD "several years ago" per the patient. Review of records indicate he had a colonoscopy and EGD 03/09/2008. Colonoscopy found normal rectum, multiple polyps, poor prep. EGD found normal esophagus s/p Maloney dilation, small hiatal hernia, antral erosions, otherwise  normal. Surgical pathology found the polyps to be   Repeat colonoscopy 07/22/2012 which found two small anal papilla otherwise normal colon, recommend repeat exam in 5 years (06/2017)  Past Medical History:  Diagnosis Date  . Anxiety   . Bipolar disorder ([place])   . BPH (benign prostatic hyperplasia)   . Cirrhosis ([place])   . COPD (chronic obstructive pulmonary disease) ([place])   . Depression   . Epicondylitis    hips  . ETOH abuse   . GERD (gastroesophageal reflux disease)   . Head injury   . Hepatitis C   . Hypothyroidism   . Oxygen decrease   . Pancreatitis chronic   . Shortness of breath     Past Surgical History:  Procedure Laterality Date  . APPENDECTOMY  age 63  . COLONOSCOPY  07/22/2012   Procedure: COLONOSCOPY;  Surgeon: Rogene Houston, MD;  [facility]: [facility];  Service: Endoscopy;  Laterality: N/A;  225-moved to 200 Ann notified pt  . FASCIOTOMY Left 08/30/2012   Procedure: FASCIOTOMY;  Surgeon: Johnn Hai, MD;  Location: [facility];  Service: Orthopedics;  Laterality: Left;  . FEMUR FRACTURE SURGERY     otif  . I&D EXTREMITY Left 09/03/2012   Procedure: IRRIGATION AND DEBRIDEMENT EXTREMITY;  Surgeon: Johnny Bridge, MD;  Location: [facility];  Service: Orthopedics;  Laterality: Left;  . SKIN SPLIT GRAFT Left 09/08/2012   Procedure:  SPLIT THICKNESS SKIN GRAFT LEFT LEG ;  Surgeon: Rozanna Box, MD;  Location: [facility];  Service: Orthopedics;  Laterality: Left;  . TOTAL HIP ARTHROPLASTY  1 year ago   right hip-[place]  . TOTAL HIP  REVISION Right 10/25/2016   Procedure: Revision right hip constrained liner;  Surgeon: Gaynelle Arabian, MD;  Location: [facility];  Service: Orthopedics;  Laterality: Right;  . TRANSURETHRAL RESECTION OF PROSTATE  05/23/2011   Procedure: TRANSURETHRAL RESECTION OF THE PROSTATE (TURP);  Surgeon: Marissa Nestle;  Location: [facility];  Service: Urology;  Laterality: N/A;  placement of suprapubic catheter    Prior to Admission medications   Medication  Sig Start Date End Date Taking? Authorizing Provider  aspirin EC 325 MG EC tablet Take 1 tablet (325 mg total) by mouth daily with breakfast. 10/26/16   Gaynelle Arabian, MD  fentaNYL (DURAGESIC - DOSED MCG/HR) 100 MCG/HR Place 1 patch (100 mcg total) onto the skin every other day. This dose was verified, it is correct Patient taking differently: Place 100 mcg onto the skin every other day.  11/12/14   Sinda Du, MD  lamoTRIgine (LAMICTAL) 100 MG tablet Take 1 tablet (100 mg total) by mouth 2 (two) times daily. 11/12/14   Sinda Du, MD  levothyroxine (SYNTHROID, LEVOTHROID) 88 MCG tablet Take 88 mcg by mouth daily.    [provider]  lithium carbonate 300 MG capsule Take 1 capsule (300 mg total) by mouth 2 (two) times daily with a meal. 11/12/14   Sinda Du, MD  LORazepam (ATIVAN) 1 MG tablet Take one every 6-8 hours as needed for shaking and anxiety Patient taking differently: Take 0.5 mg by mouth 2 (two) times daily.  11/13/14   Milton Ferguson, MD  methocarbamol (ROBAXIN) 500 MG tablet Take 1 tablet (500 mg total) by mouth every 6 (six) hours as needed for muscle spasms. 10/26/16   Gaynelle Arabian, MD  Multiple Vitamin (MULTIVITAMIN WITH MINERALS) TABS tablet Take 1 tablet by mouth daily.    [provider]  oxyCODONE (OXY IR/ROXICODONE) 5 MG immediate release tablet Take 2 tablets (10 mg total) by mouth every 3 (three) hours as needed for breakthrough pain. 10/26/16   Gaynelle Arabian, MD  OXYGEN Inhale 2.5 L into the lungs daily as needed (for breathing).     [provider]  traMADol (ULTRAM) 50 MG tablet Take 1-2 tablets (50-100 mg total) by mouth every 6 (six) hours as needed for moderate pain. 10/26/16   Gaynelle Arabian, MD    No current facility-administered medications for this encounter.    Current Outpatient Prescriptions  Medication Sig Dispense Refill  . aspirin EC 325 MG EC tablet Take 1 tablet (325 mg total) by mouth daily with breakfast. 30 tablet 0   . fentaNYL (DURAGESIC - DOSED MCG/HR) 100 MCG/HR Place 1 patch (100 mcg total) onto the skin every other day. This dose was verified, it is correct (Patient taking differently: Place 100 mcg onto the skin every other day. ) 5 patch 0  . lamoTRIgine (LAMICTAL) 100 MG tablet Take 1 tablet (100 mg total) by mouth 2 (two) times daily. 60 tablet 3  . levothyroxine (SYNTHROID, LEVOTHROID) 88 MCG tablet Take 88 mcg by mouth daily.    Marland Kitchen lithium carbonate 300 MG capsule Take 1 capsule (300 mg total) by mouth 2 (two) times daily with a meal. 60 capsule 3  . LORazepam (ATIVAN) 1 MG tablet Take one every 6-8 hours as needed for shaking and anxiety (Patient taking differently: Take 0.5 mg by mouth 2 (two) times daily. ) 30 tablet 0  . methocarbamol (ROBAXIN) 500 MG tablet Take 1 tablet (500 mg total) by mouth every 6 (six) hours as needed for muscle spasms. 80 tablet 0  .  Multiple Vitamin (MULTIVITAMIN WITH MINERALS) TABS tablet Take 1 tablet by mouth daily.    Marland Kitchen oxyCODONE (OXY IR/ROXICODONE) 5 MG immediate release tablet Take 2 tablets (10 mg total) by mouth every 3 (three) hours as needed for breakthrough pain. 84 tablet 0  . OXYGEN Inhale 2.5 L into the lungs daily as needed (for breathing).     . traMADol (ULTRAM) 50 MG tablet Take 1-2 tablets (50-100 mg total) by mouth every 6 (six) hours as needed for moderate pain. 80 tablet 0    Allergies as of 03/21/2017 - Review Complete 03/21/2017  Allergen Reaction Noted  . Penicillins Anaphylaxis 05/10/2011  . Vancomycin  05/10/2011  . Codeine Hives 05/10/2011  . Tylenol [acetaminophen] Other (See Comments) 01/23/2014    Family History  Problem Relation Age of Onset  . Anesthesia problems Neg Hx   . Hypotension Neg Hx   . Pseudochol deficiency Neg Hx   . Malignant hyperthermia Neg Hx     Social History   Social History  . Marital status: Divorced    Spouse name: N/A  . Number of children: N/A  . Years of education: N/A   Occupational History  .  Not on file.   Social History Main Topics  . Smoking status: Current Every Day Smoker    Packs/day: 1.00    Years: 30.00    Types: Cigarettes  . Smokeless tobacco: Never Used  . Alcohol use 1.8 oz/week    3 Cans of beer per week     Comment: 3 beers per day  . Drug use: No     Comment: denies use 03/21/17  . Sexual activity: Not on file   Other Topics Concern  . Not on file   Social History Narrative  . No narrative on file    Review of Systems: General: Negative for anorexia, weight loss, fever, chills. Eyes: Negative for vision changes.  ENT: Negative for hoarseness, difficulty swallowing. CV: Negative for chest pain, angina, palpitations, peripheral edema.  Respiratory: Negative for dyspnea at rest, cough, sputum, wheezing.  GI: See history of present illness. Endo: Negative for unusual weight change.  Heme: Negative for bruising or bleeding. Allergy: Negative for rash or hives.  Physical Exam: Vital signs in last 24 hours: Temp:  [98.2 F (36.8 C)-98.4 F (36.9 C)] 98.2 F (36.8 C) (09/28 1431) Pulse Rate:  [83-102] 83 (09/28 1504) Resp:  [17-19] 17 (09/28 1504) BP: (102-160)/(63-86) 145/78 (09/28 1504) SpO2:  [98 %-100 %] 100 % (09/28 1504) Weight:  [125 lb (56.7 kg)] 125 lb (56.7 kg) (09/28 1216)   General:   Alert,  Well-developed, well-nourished, pleasant and cooperative in NAD Head:  Normocephalic and atraumatic. Eyes:  Sclera clear, no icterus. Conjunctiva pink. Ears:  Normal auditory acuity. Nose:  No deformity, discharge,  or lesions. Lungs:  Clear throughout to auscultation. No wheezes, crackles, or rhonchi. No acute distress. Heart:  Regular rate and rhythm; no murmurs, clicks, rubs, or gallops. Abdomen:  Soft, and nondistended. Mild to moderate epigastric to mid-abdominal TTP. No masses, hepatosplenomegaly or hernias noted. Normal bowel sounds, without guarding, and without rebound.   Rectal:  Deferred.   Msk:  Symmetrical without gross deformities.  Normal posture. Pulses:  Normal bilateral DP pulses noted. Extremities:  Without clubbing or edema. Neurologic:  Alert and oriented x4; grossly normal neurologically. Psych:  Alert and cooperative. Normal mood and affect.  Intake/Output from previous day: No intake/output data recorded. Intake/Output this shift: No intake/output data recorded.  Lab Results:  Recent Labs  03/21/17 1310  WBC 10.4  HGB 8.3*  HCT 24.3*  PLT 311   BMET  Recent Labs  03/21/17 1310  NA 134*  K 3.6  CL 99*  CO2 29  GLUCOSE 110*  BUN 23*  CREATININE 0.73  CALCIUM 10.0   LFT  Recent Labs  03/21/17 1310  PROT 6.6  ALBUMIN 3.7  AST 24  ALT 17  ALKPHOS 48  BILITOT 0.4   PT/INR  Recent Labs  03/21/17 1340  LABPROT 12.6  INR 0.95   Hepatitis Panel No results for input(s): HEPBSAG, HCVAB, HEPAIGM, HEPBIGM in the last 72 hours. C-Diff No results for input(s): CDIFFTOX in the last 72 hours.  Studies/Results: No results found.  Impression: 56 year old male with a history of Hep C Cirrhosis regularly seen by Dr. Laural Golden who presents with 2 days of melena and bright red blood. His liver functions appears compensated, normal platelet count which suggests NO portal hypertension (no recent abdominal imaging to suggest spleen size). He has been taking significant NSAIDs (from every 5-6 hours or as many as 200 in a week per the patient's sister). Additionally he continues to drink. Some nausea and vomiting but without hematemesis. History of chronic pancreatitis.  History of colon adenoma, TCS up to date (2014) and due in 4 months for a repeat exam.  GI bleed likely due to ETOH and NSAID injury: esophagitis, gastritis, duodenitis, PUD, duodenal ulcer. Less likely variceal bleed. Possible lower GI bleed due to NSAID ulceration, less likely CRC or inflammatory bowel disease.  **Will need propofol due to polypharmacy** (Home medications include Ultram, Ambien, Oxycodone,  fentanyl)  Plan: 1. Protonix 40 mg IV bid 2. Zofran q6 hours prn nausea 3. Pain management per hospitalist 4. Lipase 5. Follow hgb closely 6. Transfuse as necessary 7. Clear liquid diet 8. NPO after midnight 9. CT abdomen/pelvis with contrast tonight 10. Supportive measures 11. Will likely need endoscopic evaluation, will discuss EGD vs. EGD and colonoscopy with Dr. Oneida Alar for tomorrow on propofol/MAC    Thank you for allowing [place] to participate in the care of [place], DNP, AGNP-C Adult & Gerontological Nurse Practitioner [facility]    LOS: 0 days     03/21/2017, 3:18 PM

## 2017-03-22 NOTE — Anesthesia Postprocedure Evaluation (Addendum)
Anesthesia Post Note  Patient: [name]  Procedure(s) Performed: Procedure(s)  Upper GI Endoscopy with cold forceps Biopsy   Patient location during evaluation: PACU Anesthesia Type: MAC Level of consciousness: awake and alert Pain management: satisfactory to patient Vital Signs Assessment: post-procedure vital signs reviewed and stable Respiratory status: spontaneous breathing Cardiovascular status: stable Postop Assessment: no apparent nausea or vomiting Anesthetic complications: no     Last Vitals:  Vitals:   03/22/17 1342 03/22/17 1343  BP: 102/66 112/64  Pulse: 76 70  Resp: 18 16  Temp:    SpO2: 97% 97%    Last Pain:  Vitals:   03/22/17 1243  TempSrc:   PainSc: 0-No pain                 Jakeim Sedore

## 2017-03-22 NOTE — Progress Notes (Signed)
Patient taken by stretcher for EGD in endo.

## 2017-03-22 NOTE — Transfer of Care (Signed)
Immediate Anesthesia Transfer of Care Note  Patient: [name]  Procedure(s) Performed: Upper GI Endoscopy with Cold Forceps biopsy  Patient Location: PACU  Anesthesia Type:MAC  Level of Consciousness: awake and alert   Airway & Oxygen Therapy: Patient Spontanous Breathing  Post-op Assessment: Report given to RN and Post -op Vital signs reviewed and stable  Post vital signs: Reviewed and stable  Last Vitals:  Vitals:   03/22/17 1342 03/22/17 1343  BP: 102/66 112/64  Pulse: 76 70  Resp: 18 16  Temp:    SpO2: 97% 97%    Last Pain:  Vitals:   03/22/17 1243  TempSrc:   PainSc: 0-No pain      Patients Stated Pain Goal: 9 (56/43/32 9518)  Complications: No apparent anesthesia complications

## 2017-03-22 NOTE — Op Note (Addendum)
[facility] Patient Name: [name] Procedure Date: 03/22/2017 12:33 PM MRN: [id number] Date of Birth: 1960-08-29 Attending MD: Barney Drain MD, MD CSN: [id number] Age: 56 Admit Type: Inpatient Procedure:                Upper GI endoscopy WITH COLD FORCEPS BIOPSY Indications:              Melena Providers:                Barney Drain MD, MD, Lurline Del, RN, Bonnetta Barry,                            Technician Referring MD:             Jasper Loser. Luan Pulling MD, MD Medicines:                Propofol per Anesthesia Complications:            No immediate complications. Estimated Blood Loss:     Estimated blood loss was minimal. Procedure:                Pre-Anesthesia Assessment:                           - Prior to the procedure, a History and Physical                            was performed, and patient medications and                            allergies were reviewed. The patient's tolerance of                            previous anesthesia was also reviewed. The risks                            and benefits of the procedure and the sedation                            options and risks were discussed with the patient.                            All questions were answered, and informed consent                            was obtained. Prior Anticoagulants: The patient has                            taken aspirin, last dose was 2 days prior to                            procedure. ASA Grade Assessment: II - A patient                            with mild systemic disease. After reviewing the  risks and benefits, the patient was deemed in                            satisfactory condition to undergo the procedure.                            After obtaining informed consent, the endoscope was                            passed under direct vision. Throughout the                            procedure, the patient's blood pressure, pulse, and        oxygen saturations were monitored continuously. The                            [id number] ([id number]) scope was introduced through the                            mouth, and advanced to the second part of duodenum.                            The upper GI endoscopy was accomplished without                            difficulty. The patient tolerated the procedure                            well. Scope In: 1:07:33 PM Scope Out: 1:13:36 PM Total Procedure Duration: 0 hours 6 minutes 3 seconds  Findings:      The examined esophagus was normal.      Diffuse moderate inflammation characterized by congestion (edema),       erosions and erythema was found in the gastric body and in the gastric       antrum. Biopsies were taken with a cold forceps for Helicobacter pylori       testing.      One non-bleeding cratered duodenal ulcer with no stigmata of bleeding       was found in the duodenal bulb.      Diffuse mild inflammation characterized by congestion (edema) was found       in the second portion of the duodenum. Impression:               - Normal esophagus. NO ESOPHAGEAL VARICES                           - MODERATE Gastritis/DUODENITIS.                           - GI BLEED DUE TO LARGE duodenal ulcer, CLEAN BASED                            AND LOW RISK FOR RE-BLEEDING Moderate Sedation:      Per Anesthesia Care Recommendation:           - Await pathology  results.                           - Low fat diet.                           - Continue present medications. AVOID ASA/NSAIDS                            FOR 2 WEEKS.                           - Use Protonix (pantoprazole) 40 mg PO BID for 3                            months THEN ONCE DAILY                           - D/C HOME WITHIN THE NEXT 24 HRS.                           - Return to my office in 4 months.                           - Patient has a contact number available for                            emergencies. The signs and  symptoms of potential                            delayed complications were discussed with the                            patient. Return to normal activities tomorrow.                            Written discharge instructions were provided to the                            patient. Procedure Code(s):        --- Professional ---                           [phone], Esophagogastroduodenoscopy, flexible,                            transoral; with biopsy, single or multiple Diagnosis Code(s):        --- Professional ---                           K29.70, Gastritis, unspecified, without bleeding                           K26.9, Duodenal ulcer, unspecified as acute or  chronic, without hemorrhage or perforation                           K29.80, Duodenitis without bleeding                           K92.1, Melena (includes Hematochezia) CPT copyright 2016 American Medical Association. All rights reserved. The codes documented in this report are preliminary and upon coder review may  be revised to meet current compliance requirements. Barney Drain, MD Barney Drain MD, MD 03/22/2017 1:22:21 PM This report has been signed electronically. Number of Addenda: 0

## 2017-03-22 NOTE — Progress Notes (Signed)
Patient discharged to home. IV access removed and he had called his sister to transport him home. Sister in [place] stated that patients other sister had not called in home medications. I asked Dr. Wynetta Emery if he would give patient a prescription until patient could see Dr. Luan Pulling and he did. Will pick those up at [facility]. Rolled to canopy area and he stated he would wait for sister to arrive and he had also retrieved keys and pocket knife from security.

## 2017-03-22 NOTE — Anesthesia Preprocedure Evaluation (Signed)
Anesthesia Evaluation  Patient identified by MRN, date of birth, ID band Patient awake    Reviewed: Allergy & Precautions, NPO status , Patient's Chart, lab work & pertinent test results  History of Anesthesia Complications Negative for: history of anesthetic complications  Airway Mallampati: II  TM Distance: >3 FB Neck ROM: Full    Dental  (+) Edentulous Lower, Edentulous Upper, Dental Advisory Given   Pulmonary shortness of breath, COPD, Current Smoker,    Pulmonary exam normal        Cardiovascular negative cardio ROS Normal cardiovascular exam     Neuro/Psych PSYCHIATRIC DISORDERS Anxiety Depression Bipolar Disorder negative neurological ROS     GI/Hepatic GERD  ,(+) Hepatitis -, C  Endo/Other    Renal/GU negative Renal ROS     Musculoskeletal   Abdominal   Peds  Hematology   Anesthesia Other Findings   Reproductive/Obstetrics                             Anesthesia Physical  Anesthesia Plan  ASA: III and emergent  Anesthesia Plan: MAC   Post-op Pain Management:    Induction:   PONV Risk Score and Plan:   Airway Management Planned: Natural Airway and Simple Face Mask  Additional Equipment:   Intra-op Plan:   Post-operative Plan:   Informed Consent: I have reviewed the patients History and Physical, chart, labs and discussed the procedure including the risks, benefits and alternatives for the proposed anesthesia with the patient or authorized representative who has indicated his/her understanding and acceptance.   Dental advisory given  Plan Discussed with:   Anesthesia Plan Comments:         Anesthesia Quick Evaluation

## 2017-03-22 NOTE — Discharge Summary (Signed)
Physician Discharge Summary  [name]:[id number] DOB: 1960-09-15 DOA: 03/21/2017  PCP: Sinda Du, MD GI: [facility]   Admit date: 03/21/2017 Discharge date: 03/22/2017  Admitted From: Home  Disposition: Home   Recommendations for Outpatient Follow-up:  1. Follow up with PCP in 2 weeks 2. Please avoid NSAIDS for 2 weeks 3. Please obtain BMP/CBC in 2 weekS 4. Please follow up on the following pending results: BIOPSY RESULTS FROM EGD  Discharge Condition: STABLE   CODE STATUS: FULL    Brief Hospitalization Summary: Please see all hospital notes, images, labs for full details of the hospitalization.  HPI: [name] is a 56 y.o. male with chronic alcoholism and cirrhosis, COPD and multiple other comorbidities detailed below who presented to the emergency department with 4 days of GI bleeding. The patient reports that he initially had black stools and maroon-colored stools that now have progressed to bright red stools. The patient has been fatigued. The patient reports that he feels dehydrated. The patient reports that he has no chest pain or shortness of breath.  He is now having multiple bloody stools per day. He has soiled his clothing with blood-tinged stool. He denies fever and chill. He has chronic shortness of breath and uses nighttime oxygen. He denies having history of colonoscopy and denies ever having a blood transfusion. He denies esophageal varices.  1. Acute GI bleeding - Pt was seen by GI and had EGD 03/22/17 with large peptic ulcer seen thought due to heavy NSAID use.  He was advised to take protonix 40 mg BID x 3 months and then once daily.  Avoid NSAIDS for 2 weeks.  Follow up with GI in 4 months. Discharging home. Follow up with PCP in 2 weeks.  2. Chronic alcoholism-alcohol withdrawal protocol ordered. Lorazepam as needed. Resume vitamin supplements.  3. Hypothyroidism-resume home levothyroxine dose. 4. Bipolar disorder-resume home  antipsychotic medications. 5. COPD-stable, continue supplemental oxygen and scheduled nebulizer treatments. 6. Chronic pain-resuming home meds at discharge.  7. Acute blood loss anemia-secondary to PUD.  S/p 2 units PRBC.  Hg >7.5.   DVT Prophylaxis: SCDs Code Status: FULL   Family Communication: wife bedside  Disposition Plan: Home   Discharge Diagnoses:  Principal Problem:   GI bleeding Active Problems:   Chronic pancreatitis (HCC)   Cirrhosis ([place])   Hepatitis C   Hypothyroid   COPD (chronic obstructive pulmonary disease) (HCC)   Chronic pain   Bipolar disorder ([place])   H/O ETOH abuse    Discharge Instructions: Discharge Instructions    Call MD for:  difficulty breathing, headache or visual disturbances    Complete by:  As directed    Call MD for:  extreme fatigue    Complete by:  As directed    Call MD for:  hives    Complete by:  As directed    Call MD for:  persistant dizziness or light-headedness    Complete by:  As directed    Call MD for:  persistant nausea and vomiting    Complete by:  As directed    Call MD for:  severe uncontrolled pain    Complete by:  As directed    Increase activity slowly    Complete by:  As directed      Allergies as of 03/22/2017      Reactions   Penicillins Anaphylaxis   Has patient had a PCN reaction causing immediate rash, facial/tongue/throat swelling, SOB or lightheadedness with hypotension: No Has patient had a  PCN reaction causing severe rash involving mucus membranes or skin necrosis:No Has patient had a PCN reaction that required hospitalization:No Has patient had a PCN reaction occurring within the last 10 years: No If all of the above answers are "NO", then may proceed with Cephalosporin use.   Vancomycin    redman syndrome   Codeine Hives   Tylenol [acetaminophen] Other (See Comments)   Patient has hepatitis      Medication List    STOP taking these medications   aspirin 325 MG EC tablet     TAKE these  medications   lamoTRIgine 100 MG tablet Commonly known as:  LAMICTAL Take 1 tablet (100 mg total) by mouth 2 (two) times daily.   levothyroxine 88 MCG tablet Commonly known as:  SYNTHROID, LEVOTHROID Take 88 mcg by mouth daily.   lithium carbonate 300 MG capsule Take 1 capsule (300 mg total) by mouth 2 (two) times daily with a meal.   LORazepam 1 MG tablet Commonly known as:  ATIVAN Take one every 6-8 hours as needed for shaking and anxiety What changed:  how much to take  how to take this  when to take this  additional instructions   multivitamin with minerals Tabs tablet Take 1 tablet by mouth daily.   OXYGEN Inhale 2.5 L into the lungs daily as needed (for breathing).   pantoprazole 40 MG tablet Commonly known as:  PROTONIX Take 1 tablet (40 mg total) by mouth 2 (two) times daily.   traMADol 50 MG tablet Commonly known as:  ULTRAM Take 1-2 tablets (50-100 mg total) by mouth every 6 (six) hours as needed for moderate pain.            Discharge Care Instructions        Start     Ordered   03/22/17 0000  pantoprazole (PROTONIX) 40 MG tablet  2 times daily     03/22/17 1334   03/22/17 0000  Increase activity slowly     03/22/17 1335   03/22/17 0000  Call MD for:  persistant nausea and vomiting     03/22/17 1335   03/22/17 0000  Call MD for:  severe uncontrolled pain     03/22/17 1335   03/22/17 0000  Call MD for:  difficulty breathing, headache or visual disturbances     03/22/17 1335   03/22/17 0000  Call MD for:  hives     03/22/17 1335   03/22/17 0000  Call MD for:  persistant dizziness or light-headedness     03/22/17 1335   03/22/17 0000  Call MD for:  extreme fatigue     03/22/17 1335     Follow-up Information    Sinda Du, MD. Schedule an appointment as soon as possible for a visit in 2 week(s).   Specialty:  Pulmonary Disease Contact information: [address] [place] [address] [phone]        Danie Binder, MD. Schedule an appointment as soon as possible for a visit in 4 month(s).   Specialty:  Gastroenterology Why:  EGD / Hospital Follow Up  Contact information: [address] [place] [address] [phone]          Allergies  Allergen Reactions  . Penicillins Anaphylaxis    Has patient had a PCN reaction causing immediate rash, facial/tongue/throat swelling, SOB or lightheadedness with hypotension: No Has patient had a PCN reaction causing severe rash involving mucus membranes or skin necrosis:No Has patient had a  PCN reaction that required hospitalization:No Has patient had a PCN reaction occurring within the last 10 years: No If all of the above answers are "NO", then may proceed with Cephalosporin use.   . Vancomycin     redman syndrome  . Codeine Hives  . Tylenol [Acetaminophen] Other (See Comments)    Patient has hepatitis   Current Discharge Medication List    START taking these medications   Details  pantoprazole (PROTONIX) 40 MG tablet Take 1 tablet (40 mg total) by mouth 2 (two) times daily. Qty: 60 tablet, Refills: 1      CONTINUE these medications which have NOT CHANGED   Details  lamoTRIgine (LAMICTAL) 100 MG tablet Take 1 tablet (100 mg total) by mouth 2 (two) times daily. Qty: 60 tablet, Refills: 3    levothyroxine (SYNTHROID, LEVOTHROID) 88 MCG tablet Take 88 mcg by mouth daily.    lithium carbonate 300 MG capsule Take 1 capsule (300 mg total) by mouth 2 (two) times daily with a meal. Qty: 60 capsule, Refills: 3    LORazepam (ATIVAN) 1 MG tablet Take one every 6-8 hours as needed for shaking and anxiety Qty: 30 tablet, Refills: 0    Multiple Vitamin (MULTIVITAMIN WITH MINERALS) TABS tablet Take 1 tablet by mouth daily.    OXYGEN Inhale 2.5 L into the lungs daily as needed (for breathing).     traMADol (ULTRAM) 50 MG tablet Take 1-2 tablets (50-100 mg total) by mouth every 6 (six) hours as needed for moderate pain. Qty: 80 tablet,  Refills: 0      STOP taking these medications     aspirin EC 325 MG EC tablet         Procedures/Studies: Ct Abdomen Pelvis W Contrast  Result Date: 03/21/2017 CLINICAL DATA:  Unintended weight loss EXAM: CT ABDOMEN AND PELVIS WITH CONTRAST TECHNIQUE: Multidetector CT imaging of the abdomen and pelvis was performed using the standard protocol following bolus administration of intravenous contrast. CONTRAST:  37mL ISOVUE-300 IOPAMIDOL (ISOVUE-300) INJECTION 61%, 130mL ISOVUE-300 IOPAMIDOL (ISOVUE-300) INJECTION 61% COMPARISON:  11/03/2014 FINDINGS: Lower chest: Lung bases demonstrate no acute consolidation or effusion. Normal heart size. Hepatobiliary: Subcentimeter hypodensity within the anterior liver, too small to further characterize. Surgical absence of the gallbladder. No biliary dilatation Pancreas: Unremarkable. No pancreatic ductal dilatation or surrounding inflammatory changes. Spleen: Normal in size without focal abnormality. Adrenals/Urinary Tract: Adrenal glands are within normal limits. 15 mm intermediate density exophytic lesion upper pole left kidney. Other subcentimeter cortical hypodense lesions in the left kidney too small to further characterize. Bladder obscured by artifact Stomach/Bowel: The stomach is nonenlarged. Mild wall thickening and surrounding inflammatory changes at the duodenum bulb and second portion of the duodenum. No evidence for a bowel obstruction. No colon wall thickening. Appendix not visualized consistent with history of appendectomy Vascular/Lymphatic: Aortic atherosclerosis. No aneurysmal dilatation. Nonspecific retroperitoneal subcentimeter lymph nodes. Reproductive: Prostate is unremarkable. Other: Negative for free air or free fluid. Musculoskeletal: Status post right hip replacement with artifact. No acute or suspicious bone lesion. IMPRESSION: 1. Wall thickening and surrounding inflammatory changes at the duodenal bulb and second portion of duodenum  suggesting a duodenitis or possible ulcer disease. No evidence for a perforation. Recommend correlation with endoscopy/direct visualization. 2. Intermediate density exophytic 15 mm lesion upper pole left kidney. When the patient is clinically stable and able to follow directions and hold their breath (preferably as an outpatient) further evaluation with dedicated abdominal MRI should be considered. Electronically Signed   By: Donavan Foil  M.D.   On: 03/21/2017 21:31      Subjective: Pt tolerated procedure well.    Discharge Exam: Vitals:   03/22/17 1229 03/22/17 1330  BP: 105/65 98/69  Pulse: 73 80  Resp: 12 (!) 21  Temp: 98 F (36.7 C) 97.8 F (36.6 C)  SpO2: 98% 100%   Vitals:   03/22/17 1153 03/22/17 1208 03/22/17 1229 03/22/17 1330  BP:  97/67 105/65 98/69  Pulse: 80  73 80  Resp: 15 11 12  (!) 21  Temp: 97.8 F (36.6 C) 98.1 F (36.7 C) 98 F (36.7 C) 97.8 F (36.6 C)  TempSrc: Oral Oral Oral   SpO2: 100%  98% 100%  Weight:      Height:       General exam: chronically ill appearing, emaciated. Pale.  Respiratory system: Clear. No increased work of breathing. Cardiovascular system: S1 & S2 heard.  No JVD, murmurs, gallops, clicks or pedal edema. Gastrointestinal system: Abdomen is nondistended, soft and mild nonspecific tenderness. Normal bowel sounds heard. Central nervous system: Alert and oriented. No focal neurological deficits. Extremities: no CCE.  The results of significant diagnostics from this hospitalization (including imaging, microbiology, ancillary and laboratory) are listed below for reference.     Microbiology: Recent Results (from the past 240 hour(s))  MRSA PCR Screening     Status: None   Collection Time: 03/21/17  4:45 PM  Result Value Ref Range Status   MRSA by PCR NEGATIVE NEGATIVE Final    Comment:        The GeneXpert MRSA Assay (FDA approved for NASAL specimens only), is one component of a comprehensive MRSA colonization surveillance  program. It is not intended to diagnose MRSA infection nor to guide or monitor treatment for MRSA infections.      Labs: BNP (last 3 results) No results for input(s): BNP in the last 8760 hours. Basic Metabolic Panel:  Recent Labs Lab 03/21/17 1310 03/22/17 0422  NA 134* 136  K 3.6 3.8  CL 99* 105  CO2 29 26  GLUCOSE 110* 142*  BUN 23* 15  CREATININE 0.73 0.83  CALCIUM 10.0 8.0*   Liver Function Tests:  Recent Labs Lab 03/21/17 1310 03/22/17 0422  AST 24 20  ALT 17 13*  ALKPHOS 48 37*  BILITOT 0.4 0.4  PROT 6.6 5.2*  ALBUMIN 3.7 2.8*    Recent Labs Lab 03/21/17 1602  LIPASE 28   No results for input(s): AMMONIA in the last 168 hours. CBC:  Recent Labs Lab 03/21/17 1310 03/22/17 0422  WBC 10.4 8.0  HGB 8.3* 6.6*  HCT 24.3* 19.5*  MCV 96.8 98.5  PLT 311 237   Cardiac Enzymes: No results for input(s): CKTOTAL, CKMB, CKMBINDEX, TROPONINI in the last 168 hours. BNP: Invalid input(s): POCBNP CBG: No results for input(s): GLUCAP in the last 168 hours. D-Dimer No results for input(s): DDIMER in the last 72 hours. Hgb A1c No results for input(s): HGBA1C in the last 72 hours. Lipid Profile No results for input(s): CHOL, HDL, LDLCALC, TRIG, CHOLHDL, LDLDIRECT in the last 72 hours. Thyroid function studies No results for input(s): TSH, T4TOTAL, T3FREE, THYROIDAB in the last 72 hours.  Invalid input(s): FREET3 Anemia work up No results for input(s): VITAMINB12, FOLATE, FERRITIN, TIBC, IRON, RETICCTPCT in the last 72 hours. Urinalysis    Component Value Date/Time   COLORURINE YELLOW 11/11/2014 0315   APPEARANCEUR CLEAR 11/11/2014 0315   LABSPEC 1.015 11/11/2014 0315   PHURINE 7.5 11/11/2014 0315   GLUCOSEU NEGATIVE 11/11/2014  [place] 11/11/2014 0315   BILIRUBINUR NEGATIVE 11/11/2014 0315   KETONESUR NEGATIVE 11/11/2014 0315   PROTEINUR NEGATIVE 11/11/2014 0315   UROBILINOGEN 0.2 11/11/2014 0315   NITRITE NEGATIVE 11/11/2014 0315    LEUKOCYTESUR NEGATIVE 11/11/2014 0315   Sepsis Labs Invalid input(s): PROCALCITONIN,  WBC,  LACTICIDVEN Microbiology Recent Results (from the past 240 hour(s))  MRSA PCR Screening     Status: None   Collection Time: 03/21/17  4:45 PM  Result Value Ref Range Status   MRSA by PCR NEGATIVE NEGATIVE Final    Comment:        The GeneXpert MRSA Assay (FDA approved for NASAL specimens only), is one component of a comprehensive MRSA colonization surveillance program. It is not intended to diagnose MRSA infection nor to guide or monitor treatment for MRSA infections.     Time coordinating discharge:   SIGNED:  Irwin Brakeman, MD  [facility] 03/22/2017, 1:36 PM Pager [phone]  If 7PM-7AM, please contact night-coverage [email or web address] Password [email or web address]

## 2017-03-23 LAB — TYPE AND SCREEN
ABO/RH(D): O NEG
Antibody Screen: NEGATIVE
Unit division: 0
Unit division: 0

## 2017-03-23 LAB — BPAM RBC
BLOOD PRODUCT EXPIRATION DATE: 201810202359
BLOOD PRODUCT EXPIRATION DATE: 201810292359
ISSUE DATE / TIME: 201809290832
ISSUE DATE / TIME: 201809291140
UNIT TYPE AND RH: 9500
Unit Type and Rh: 9500

## 2017-03-24 ENCOUNTER — Encounter (HOSPITAL_COMMUNITY): Payer: Self-pay | Admitting: Gastroenterology

## 2017-03-24 ENCOUNTER — Encounter (INDEPENDENT_AMBULATORY_CARE_PROVIDER_SITE_OTHER): Payer: Self-pay | Admitting: *Deleted

## 2017-03-24 NOTE — Telephone Encounter (Signed)
OV sch'd 07/29/16 at 215, letter mailed to patient

## 2017-03-25 NOTE — Telephone Encounter (Signed)
Will arrange for office visit in 3 months.

## 2017-06-06 ENCOUNTER — Encounter (INDEPENDENT_AMBULATORY_CARE_PROVIDER_SITE_OTHER): Payer: Self-pay | Admitting: Internal Medicine

## 2017-06-09 ENCOUNTER — Ambulatory Visit (INDEPENDENT_AMBULATORY_CARE_PROVIDER_SITE_OTHER): Payer: Self-pay | Admitting: Internal Medicine

## 2017-06-09 ENCOUNTER — Encounter (INDEPENDENT_AMBULATORY_CARE_PROVIDER_SITE_OTHER): Payer: Self-pay | Admitting: Internal Medicine

## 2017-06-25 ENCOUNTER — Ambulatory Visit (INDEPENDENT_AMBULATORY_CARE_PROVIDER_SITE_OTHER): Payer: Self-pay | Admitting: Internal Medicine

## 2017-07-01 ENCOUNTER — Encounter (INDEPENDENT_AMBULATORY_CARE_PROVIDER_SITE_OTHER): Payer: Self-pay | Admitting: Internal Medicine

## 2017-07-01 ENCOUNTER — Ambulatory Visit (INDEPENDENT_AMBULATORY_CARE_PROVIDER_SITE_OTHER): Payer: Self-pay | Admitting: Internal Medicine

## 2017-07-10 ENCOUNTER — Ambulatory Visit (INDEPENDENT_AMBULATORY_CARE_PROVIDER_SITE_OTHER): Payer: Medicaid Other | Admitting: Internal Medicine

## 2017-07-10 ENCOUNTER — Encounter (INDEPENDENT_AMBULATORY_CARE_PROVIDER_SITE_OTHER): Payer: Self-pay | Admitting: Internal Medicine

## 2017-07-10 VITALS — BP 160/100 | HR 80 | Temp 98.1°F | Ht 67.0 in | Wt 130.3 lb

## 2017-07-10 DIAGNOSIS — K7469 Other cirrhosis of liver: Secondary | ICD-10-CM

## 2017-07-10 DIAGNOSIS — K922 Gastrointestinal hemorrhage, unspecified: Secondary | ICD-10-CM | POA: Diagnosis not present

## 2017-07-10 MED ORDER — PANTOPRAZOLE SODIUM 40 MG PO TBEC: 40.0000 mg | DELAYED_RELEASE_TABLET | Freq: Two times a day (BID) | ORAL | 3 refills | Status: DC

## 2017-07-10 NOTE — Progress Notes (Signed)
Subjective:    Patient ID: [name], male    DOB: [birth year], 57 y.o.   MRN: [id number]  [place] today for f/u. Recently admitted to [facility] in September of 2018 with melena and marooned color stools.  Underwent an EGD which revealed:       in the second portion of the duodenum. Impression:               - Normal esophagus. NO ESOPHAGEAL VARICES                           - MODERATE Gastritis/DUODENITIS.                           - GI BLEED DUE TO LARGE duodenal ulcer, CLEAN BASED                            AND LOW RISK FOR RE-BLEEDING Hx of cirrhosis, Hepatitis C, GERD, alcohol abuse, chronic pancreatitis.  Had been taking ibuprofen every 5-6 hrs.  While in hospital he received 2 units of PRBCs States he has not taken any Motrin since his discharge. Has taken Aleve when he get a headache or hip pain. Takes about 2-3 Aleve's today.  He says he continues to drink 3-4 beers a day.  He denies seeing any further black stools. He drinks Ensure daily. He states he feels normal today.  Hx of Hepatitis C. Viral load in 2014 was undetectable.  Per records he did not receive tx for hepatitis C. Viral load was undetectable.   01/01/2008 Liver Biopsy:  COMMENT 1. The liver biopsy specimen shows chronic active hepatitis in the form of periportal acute and chronic inflammation with extension into the hepatic parenchyma. There is bile duct proliferation as well as periportal fibrosis with portal to portal bridging, consistent with cirrhosis. This bridging is highlighted by the reticulin and trichrome stains. In addition, there is mild micro- and macrosteatosis. An iron stain fails to highlight the presence of increased iron storage. This hepatitis is best classified as grade 2, stage 3-4. (JBK:gt, 01/04/08)  Hep C antibody. Last viral load negative.12/2007  CBC Latest Ref Rng & Units 03/22/2017 03/21/2017 10/27/2016  WBC 4.0 - 10.5 K/uL 8.0 10.4 8.4  Hemoglobin 13.0 - 17.0 g/dL 6.6(LL) 8.3(L)  13.2  Hematocrit 39.0 - 52.0 % 19.5(L) 24.3(L) 40.4  Platelets 150 - 400 K/uL 237 311 226     Review of Systems Past Medical History:  Diagnosis Date  . Anxiety   . Bipolar disorder ([place])   . BPH (benign prostatic hyperplasia)   . Cirrhosis ([place])   . COPD (chronic obstructive pulmonary disease) ([place])   . Depression   . Epicondylitis    hips  . ETOH abuse   . GERD (gastroesophageal reflux disease)   . Head injury   . Hepatitis C   . Hypothyroidism   . Oxygen decrease   . Pancreatitis chronic   . Shortness of breath     Past Surgical History:  Procedure Laterality Date  . APPENDECTOMY  age 1  . BIOPSY  03/22/2017   Procedure: BIOPSY;  Surgeon: Danie Binder, MD;  [facility];  Service: Endoscopy;;  . COLONOSCOPY  07/22/2012   Procedure: COLONOSCOPY;  Surgeon: Rogene Houston, MD;  [facility];  Service: Endoscopy;  Laterality: N/A;  225-moved to 200 Lelon Frohlich  notified pt  . ESOPHAGOGASTRODUODENOSCOPY N/A 03/22/2017   Procedure: ESOPHAGOGASTRODUODENOSCOPY (EGD);  Surgeon: Danie Binder, MD;  [facility];  Service: Endoscopy;  Laterality: N/A;  . FASCIOTOMY Left 08/30/2012   Procedure: FASCIOTOMY;  Surgeon: Johnn Hai, MD;  [facility];  Service: Orthopedics;  Laterality: Left;  . FEMUR FRACTURE SURGERY     otif  . I&D EXTREMITY Left 09/03/2012   Procedure: IRRIGATION AND DEBRIDEMENT EXTREMITY;  Surgeon: Johnny Bridge, MD;  Location: [place];  Service: Orthopedics;  Laterality: Left;  . SKIN SPLIT GRAFT Left 09/08/2012   Procedure:  SPLIT THICKNESS SKIN GRAFT LEFT LEG ;  Surgeon: Rozanna Box, MD;  Location: [place];  Service: Orthopedics;  Laterality: Left;  . TOTAL HIP ARTHROPLASTY  1 year ago   right hip-[place]  . TOTAL HIP REVISION Right 10/25/2016   Procedure: Revision right hip constrained liner;  Surgeon: Gaynelle Arabian, MD;  [facility];  Service: Orthopedics;  Laterality: Right;  . TRANSURETHRAL RESECTION OF PROSTATE   05/23/2011   Procedure: TRANSURETHRAL RESECTION OF THE PROSTATE (TURP);  Surgeon: Marissa Nestle;  [facility];  Service: Urology;  Laterality: N/A;  placement of suprapubic catheter    Allergies  Allergen Reactions  . Penicillins Anaphylaxis    Has patient had a PCN reaction causing immediate rash, facial/tongue/throat swelling, SOB or lightheadedness with hypotension: No Has patient had a PCN reaction causing severe rash involving mucus membranes or skin necrosis:No Has patient had a PCN reaction that required hospitalization:No Has patient had a PCN reaction occurring within the last 10 years: No If all of the above answers are "NO", then may proceed with Cephalosporin use.   . Vancomycin     redman syndrome  . Codeine Hives  . Tylenol [Acetaminophen] Other (See Comments)    Patient has hepatitis    Current Outpatient Medications on File Prior to Visit  Medication Sig Dispense Refill  . lamoTRIgine (LAMICTAL) 100 MG tablet Take 100 mg by mouth daily.    Marland Kitchen levothyroxine (SYNTHROID, LEVOTHROID) 88 MCG tablet Take 88 mcg by mouth daily before breakfast.    . lithium carbonate 300 MG capsule Take 300 mg by mouth 2 (two) times daily with a meal.    . LORazepam (ATIVAN) 1 MG tablet Take 0.5 tablets (0.5 mg total) by mouth 2 (two) times daily.    Marland Kitchen lamoTRIgine (LAMICTAL) 100 MG tablet Take 1 tablet (100 mg total) by mouth 2 (two) times daily. (Patient taking differently: Take 100 mg by mouth daily. ) 60 tablet 0  . levothyroxine (SYNTHROID, LEVOTHROID) 88 MCG tablet Take 1 tablet (88 mcg total) by mouth daily. 30 tablet 0  . lithium carbonate 300 MG capsule Take 1 capsule (300 mg total) by mouth 2 (two) times daily with a meal. 60 capsule 0  . OXYGEN Inhale 2.5 L into the lungs daily as needed (for breathing).      No current facility-administered medications on file prior to visit.         Objective:   Physical Exam Blood pressure (!) 160/100, pulse 80, temperature 98.1 F  (36.7 C), height 5\' 7"  (1.702 m), weight 130 lb 4.8 oz (59.1 kg). Alert and oriented. Skin warm and dry. Oral mucosa is moist.   . Sclera anicteric, conjunctivae is pink. Thyroid not enlarged. No cervical lymphadenopathy. Lungs clear. Heart regular rate and rhythm.  Abdomen is soft. Bowel sounds are positive. No hepatomegaly. No abdominal masses felt. No tenderness.  No edema to lower extremities.           Assessment & Plan:  UGIB. Am going to get a CBC today.  Hx of cirrhosis:  Hepatic function. Stop the Aleve OV in 3 months

## 2017-07-10 NOTE — Patient Instructions (Addendum)
CBC, Hepatic today. OV in 3 months.  Stop the Aleve

## 2017-07-11 LAB — CBC
HCT: 46.2 % (ref 38.5–50.0)
Hemoglobin: 15.5 g/dL (ref 13.2–17.1)
MCH: 30.1 pg (ref 27.0–33.0)
MCHC: 33.5 g/dL (ref 32.0–36.0)
MCV: 89.7 fL (ref 80.0–100.0)
MPV: 10.9 fL (ref 7.5–12.5)
PLATELETS: 326 10*3/uL (ref 140–400)
RBC: 5.15 10*6/uL (ref 4.20–5.80)
RDW: 14.2 % (ref 11.0–15.0)
WBC: 8.9 10*3/uL (ref 3.8–10.8)

## 2017-07-11 LAB — HEPATIC FUNCTION PANEL
AG RATIO: 1.5 (calc) (ref 1.0–2.5)
ALKALINE PHOSPHATASE (APISO): 86 U/L (ref 40–115)
ALT: 17 U/L (ref 9–46)
AST: 22 U/L (ref 10–35)
Albumin: 4.5 g/dL (ref 3.6–5.1)
BILIRUBIN DIRECT: 0.1 mg/dL (ref 0.0–0.2)
BILIRUBIN INDIRECT: 0.5 mg/dL (ref 0.2–1.2)
Globulin: 3.1 g/dL (calc) (ref 1.9–3.7)
Total Bilirubin: 0.6 mg/dL (ref 0.2–1.2)
Total Protein: 7.6 g/dL (ref 6.1–8.1)

## 2017-07-29 ENCOUNTER — Ambulatory Visit (INDEPENDENT_AMBULATORY_CARE_PROVIDER_SITE_OTHER): Payer: Self-pay | Admitting: Internal Medicine

## 2017-08-19 ENCOUNTER — Encounter (INDEPENDENT_AMBULATORY_CARE_PROVIDER_SITE_OTHER): Payer: Self-pay | Admitting: *Deleted

## 2017-10-08 ENCOUNTER — Ambulatory Visit (INDEPENDENT_AMBULATORY_CARE_PROVIDER_SITE_OTHER): Payer: Medicaid Other | Admitting: Internal Medicine

## 2017-10-08 ENCOUNTER — Telehealth (INDEPENDENT_AMBULATORY_CARE_PROVIDER_SITE_OTHER): Payer: Self-pay | Admitting: *Deleted

## 2017-10-08 ENCOUNTER — Encounter (INDEPENDENT_AMBULATORY_CARE_PROVIDER_SITE_OTHER): Payer: Self-pay | Admitting: Internal Medicine

## 2017-10-08 ENCOUNTER — Encounter (INDEPENDENT_AMBULATORY_CARE_PROVIDER_SITE_OTHER): Payer: Self-pay | Admitting: *Deleted

## 2017-10-08 VITALS — BP 130/90 | HR 72 | Temp 98.0°F | Ht 67.0 in | Wt 126.8 lb

## 2017-10-08 DIAGNOSIS — K635 Polyp of colon: Secondary | ICD-10-CM

## 2017-10-08 MED ORDER — PEG 3350-KCL-NA BICARB-NACL 420 G PO SOLR: 4000.0000 mL | Freq: Once | ORAL | 0 refills | Status: AC

## 2017-10-08 NOTE — Telephone Encounter (Signed)
Patient needs trilyte 

## 2017-10-08 NOTE — Progress Notes (Signed)
Subjective:     Patient ID: [name], male   DOB: September 15, 1960, 57 y.o.   MRN: [id number] Medications were reviewed by Dr. Laural Golden prior to [place]. Ann went over meds with Dr. Laural Golden from triage sheet.  HPI Underwent and EGD in September of 2018. [facility]mitted to [facility] in [facility] of 2018 with melena and marooned colored stools stools. He received 2 units of PRBCs while in hospital. He had been taking Ibuprofen every 5-6 hrs.   Underwent an EGD which revealed:  in the second portion of the duodenum. Impression: - Normal esophagus. NO ESOPHAGEAL VARICES - MODERATE Gastritis/DUODENITIS. - GI BLEED DUE TO LARGE duodenal ulcer, CLEAN BASED  AND LOW RISK FOR RE-BLEEDING   He states he is doing okay. Appetite is pretty good. Weight in January was 130. Today his weight is 126.8. He eats one full meal day.  He show me he has ha hernia rt lower quadrant. He has a BM daily. No melena or BRRB. He take one Ibuprofen every 4 hrs for pain.  Takes Protonix BID for GERD.   His last colonoscopy was in 2014 for colonic adenomas.  Normal mucosa of colon and rectum. No evidence of recurrent polyps. Two small anal papillae.   Review of Systems Past Medical History:  Diagnosis Date  . Anxiety   . Bipolar disorder ([place])   . BPH (benign prostatic hyperplasia)   . Cirrhosis ([place])   . COPD (chronic obstructive pulmonary disease) ([place])   . Depression   . Epicondylitis    hips  . ETOH abuse   . GERD (gastroesophageal reflux disease)   . Head injury   . Hepatitis C   . Hypothyroidism   . Oxygen decrease   . Pancreatitis chronic   . Shortness of breath     Past Surgical History:  Procedure Laterality Date  . APPENDECTOMY  age 55  . BIOPSY  03/22/2017   Procedure: BIOPSY;  Surgeon: Danie Binder, MD;  [facility];  Service: Endoscopy;;  . COLONOSCOPY  07/22/2012   Procedure: COLONOSCOPY;  Surgeon:  Rogene Houston, MD;  [facility];  Service: Endoscopy;  Laterality: N/A;  225-moved to 200 Ann notified pt  . ESOPHAGOGASTRODUODENOSCOPY N/A 03/22/2017   Procedure: ESOPHAGOGASTRODUODENOSCOPY (EGD);  Surgeon: Danie Binder, MD;  [facility];  Service: Endoscopy;  Laterality: N/A;  . FASCIOTOMY Left 08/30/2012   Procedure: FASCIOTOMY;  Surgeon: Johnn Hai, MD;  [facility];  Service: Orthopedics;  Laterality: Left;  . FEMUR FRACTURE SURGERY     otif  . I&D EXTREMITY Left 09/03/2012   Procedure: IRRIGATION AND DEBRIDEMENT EXTREMITY;  Surgeon: Johnny Bridge, MD;  Location: [place];  Service: Orthopedics;  Laterality: Left;  . SKIN SPLIT GRAFT Left 09/08/2012   Procedure:  SPLIT THICKNESS SKIN GRAFT LEFT LEG ;  Surgeon: Rozanna Box, MD;  Location: [place];  Service: Orthopedics;  Laterality: Left;  . TOTAL HIP ARTHROPLASTY  1 year ago   right hip-[place]  . TOTAL HIP REVISION Right 10/25/2016   Procedure: Revision right hip constrained liner;  Surgeon: Gaynelle Arabian, MD;  [facility];  Service: Orthopedics;  Laterality: Right;  . TRANSURETHRAL RESECTION OF PROSTATE  05/23/2011   Procedure: TRANSURETHRAL RESECTION OF THE PROSTATE (TURP);  Surgeon: Marissa Nestle;  [facility];  Service: Urology;  Laterality: N/A;  placement of suprapubic catheter    Allergies  Allergen Reactions  . Penicillins Anaphylaxis    Has  patient had a PCN reaction causing immediate rash, facial/tongue/throat swelling, SOB or lightheadedness with hypotension: No Has patient had a PCN reaction causing severe rash involving mucus membranes or skin necrosis:No Has patient had a PCN reaction that required hospitalization:No Has patient had a PCN reaction occurring within the last 10 years: No If all of the above answers are "NO", then may proceed with Cephalosporin use.   . Vancomycin     redman syndrome  . Codeine Hives  . Tylenol [Acetaminophen] Other (See Comments)     Patient has hepatitis    Current Outpatient Medications on File Prior to Visit  Medication Sig Dispense Refill  . levothyroxine (SYNTHROID, LEVOTHROID) 88 MCG tablet Take 88 mcg by mouth daily before breakfast.    . lithium carbonate 300 MG capsule Take 300 mg by mouth 2 (two) times daily with a meal.    . LORazepam (ATIVAN) 1 MG tablet Take 0.5 tablets (0.5 mg total) by mouth 2 (two) times daily.    . OXYGEN Inhale 2.5 L into the lungs daily as needed (for breathing).     Marland Kitchen lamoTRIgine (LAMICTAL) 100 MG tablet Take 1 tablet (100 mg total) by mouth 2 (two) times daily. (Patient taking differently: Take 100 mg by mouth daily. ) 60 tablet 0  . levothyroxine (SYNTHROID, LEVOTHROID) 88 MCG tablet Take 1 tablet (88 mcg total) by mouth daily. 30 tablet 0  . lithium carbonate 300 MG capsule Take 1 capsule (300 mg total) by mouth 2 (two) times daily with a meal. 60 capsule 0  . pantoprazole (PROTONIX) 40 MG tablet Take 1 tablet (40 mg total) by mouth 2 (two) times daily before a meal. 60 tablet 3   No current facility-administered medications on file prior to visit.         Objective:   Physical Exam Blood pressure 130/90, pulse 72, temperature 98 F (36.7 C), height 5\' 7"  (1.702 m), weight 126 lb 12.8 oz (57.5 kg). Alert and oriented. Skin warm and dry. Oral mucosa is moist.   . Sclera anicteric, conjunctivae is pink. Thyroid not enlarged. No cervical lymphadenopathy. Bilateral soft wheezes.  Heart regular rate and rhythm.  Abdomen is soft. Bowel sounds are positive. No hepatomegaly. No abdominal masses felt. No tenderness.  No edema to lower extremities.    Rt inguinal hernia.        Assessment:    GERD Chronic pian. Colonic polyps.      Plan:    GERD. Continue the Protonix BID Chronic  Pain: He needs to stop the Ibuprofen completely.    Colon polyps. Diagnostic colonoscopy.   Follow up with Dr. Luan Pulling concerning rt hernia.

## 2017-10-08 NOTE — Patient Instructions (Signed)
The risks of bleeding, perforation and infection were reviewed with patient.  

## 2017-11-04 ENCOUNTER — Ambulatory Visit (INDEPENDENT_AMBULATORY_CARE_PROVIDER_SITE_OTHER): Payer: Medicaid Other | Admitting: General Surgery

## 2017-11-04 ENCOUNTER — Encounter: Payer: Self-pay | Admitting: General Surgery

## 2017-11-04 VITALS — BP 162/96 | HR 110 | Temp 99.6°F | Resp 22 | Wt 126.0 lb

## 2017-11-04 DIAGNOSIS — K4090 Unilateral inguinal hernia, without obstruction or gangrene, not specified as recurrent: Secondary | ICD-10-CM

## 2017-11-04 NOTE — Progress Notes (Signed)
[name]; [id number]; 28-Nov-1960   HPI Patient is a 57 year old white male who was referred to my care by Dr. Luan Pulling for evaluation and treatment of a right inguinal hernia.  Patient states he has had the hernia for many years, but recently has been increasing in size and causing him discomfort.  Is made worse with straining.  When it is sticking out, it causes 10 out of 10 pain.  It reduces on its own.  He denies any nausea or vomiting. Past Medical History:  Diagnosis Date  . Anxiety   . Bipolar disorder ([place])   . BPH (benign prostatic hyperplasia)   . Cirrhosis ([place])   . COPD (chronic obstructive pulmonary disease) ([place])   . Depression   . Epicondylitis    hips  . ETOH abuse   . GERD (gastroesophageal reflux disease)   . Head injury   . Hepatitis C   . Hypothyroidism   . Oxygen decrease   . Pancreatitis chronic   . Shortness of breath     Past Surgical History:  Procedure Laterality Date  . APPENDECTOMY  age 79  . BIOPSY  03/22/2017   Procedure: BIOPSY;  Surgeon: Danie Binder, MD;  [facility];  Service: Endoscopy;;  . COLONOSCOPY  07/22/2012   Procedure: COLONOSCOPY;  Surgeon: Rogene Houston, MD;  [facility];  Service: Endoscopy;  Laterality: N/A;  225-moved to 200 Ann notified pt  . ESOPHAGOGASTRODUODENOSCOPY N/A 03/22/2017   Procedure: ESOPHAGOGASTRODUODENOSCOPY (EGD);  Surgeon: Danie Binder, MD;  [facility];  Service: Endoscopy;  Laterality: N/A;  . FASCIOTOMY Left 08/30/2012   Procedure: FASCIOTOMY;  Surgeon: Johnn Hai, MD;  [facility];  Service: Orthopedics;  Laterality: Left;  . FEMUR FRACTURE SURGERY     otif  . I&D EXTREMITY Left 09/03/2012   Procedure: IRRIGATION AND DEBRIDEMENT EXTREMITY;  Surgeon: Johnny Bridge, MD;  Location: [place];  Service: Orthopedics;  Laterality: Left;  . SKIN SPLIT GRAFT Left 09/08/2012   Procedure:  SPLIT THICKNESS SKIN GRAFT LEFT LEG ;  Surgeon: Rozanna Box, MD;  Location:  [place];  Service: Orthopedics;  Laterality: Left;  . TOTAL HIP ARTHROPLASTY  1 year ago   right hip-[place]  . TOTAL HIP REVISION Right 10/25/2016   Procedure: Revision right hip constrained liner;  Surgeon: Gaynelle Arabian, MD;  [facility];  Service: Orthopedics;  Laterality: Right;  . TRANSURETHRAL RESECTION OF PROSTATE  05/23/2011   Procedure: TRANSURETHRAL RESECTION OF THE PROSTATE (TURP);  Surgeon: Marissa Nestle;  [facility];  Service: Urology;  Laterality: N/A;  placement of suprapubic catheter    Family History  Problem Relation Age of Onset  . Anesthesia problems Neg Hx   . Hypotension Neg Hx   . Pseudochol deficiency Neg Hx   . Malignant hyperthermia Neg Hx     Current Outpatient Medications on File Prior to Visit  Medication Sig Dispense Refill  . levothyroxine (SYNTHROID, LEVOTHROID) 88 MCG tablet Take 88 mcg by mouth daily before breakfast.    . lithium carbonate 300 MG capsule Take 300 mg by mouth 2 (two) times daily with a meal.    . LORazepam (ATIVAN) 1 MG tablet Take 0.5 tablets (0.5 mg total) by mouth 2 (two) times daily.    . OXYGEN Inhale 2.5 L into the lungs daily as needed (for breathing).     . pantoprazole (PROTONIX) 40 MG tablet Take 40 mg by mouth 2 (two)  times daily before a meal.    . lamoTRIgine (LAMICTAL) 100 MG tablet Take 1 tablet (100 mg total) by mouth 2 (two) times daily. (Patient taking differently: Take 100 mg by mouth daily. ) 60 tablet 0  . levothyroxine (SYNTHROID, LEVOTHROID) 88 MCG tablet Take 1 tablet (88 mcg total) by mouth daily. 30 tablet 0  . lithium carbonate 300 MG capsule Take 1 capsule (300 mg total) by mouth 2 (two) times daily with a meal. 60 capsule 0  . pantoprazole (PROTONIX) 40 MG tablet Take 1 tablet (40 mg total) by mouth 2 (two) times daily before a meal. 60 tablet 3   No current facility-administered medications on file prior to visit.     Allergies  Allergen Reactions  . Penicillins Anaphylaxis    Has  patient had a PCN reaction causing immediate rash, facial/tongue/throat swelling, SOB or lightheadedness with hypotension: No Has patient had a PCN reaction causing severe rash involving mucus membranes or skin necrosis:No Has patient had a PCN reaction that required hospitalization:No Has patient had a PCN reaction occurring within the last 10 years: No If all of the above answers are "NO", then may proceed with Cephalosporin use.   . Vancomycin     redman syndrome  . Codeine Hives  . Tylenol [Acetaminophen] Other (See Comments)    Patient has hepatitis    Social History   Substance and Sexual Activity  Alcohol Use Yes  . Alcohol/week: 1.8 oz  . Types: 3 Cans of beer per week   Comment: 3 beers per day    Social History   Tobacco Use  Smoking Status Current Every Day Smoker  . Packs/day: 1.00  . Years: 30.00  . Pack years: 30.00  . Types: Cigarettes  Smokeless Tobacco Never Used    Review of Systems  Constitutional: Positive for malaise/fatigue.  HENT: Positive for sinus pain.   Eyes: Positive for blurred vision.  Respiratory: Positive for cough, shortness of breath and wheezing.   Cardiovascular: Negative for chest pain.  Gastrointestinal: Positive for abdominal pain.  Genitourinary: Negative.   Musculoskeletal: Positive for back pain, joint pain and neck pain.  Skin: Negative.   Neurological: Positive for headaches.  Endo/Heme/Allergies: Negative.   Psychiatric/Behavioral: Negative.     Objective   Vitals:   11/04/17 1547  BP: (!) 162/96  Pulse: (!) 110  Resp: (!) 22  Temp: 99.6 F (37.6 C)    Physical Exam  Constitutional: He is oriented to person, place, and time. He appears well-developed and well-nourished.  HENT:  Head: Normocephalic and atraumatic.  Cardiovascular: Regular rhythm and normal heart sounds. Exam reveals no gallop and no friction rub.  No murmur heard. Slightly tachycardic.  Pulmonary/Chest: Effort normal and breath sounds  normal. No stridor. No respiratory distress. He has no wheezes. He has no rales.  Abdominal: Soft. Bowel sounds are normal. He exhibits no distension and no mass. There is no tenderness. There is no guarding. A hernia is present.  Easily reducible right inguinal hernia  Musculoskeletal:  Walks with a cane  Neurological: He is alert and oriented to person, place, and time.  Skin: Skin is warm and dry.  Vitals reviewed.  Dr. Luan Pulling' note reviewed. Assessment  Right inguinal hernia, COPD Plan   Patient is scheduled for a right inguinal herniorrhaphy with mesh on 11/24/2017.  I told him that this would probably be done under spinal anesthetic.  The risks and benefits of the procedure including bleeding, infection, cardiopulmonary difficulties, mesh use, and  the possibility of recurrence of the hernia were fully explained to the patient, who gave informed consent.

## 2017-11-04 NOTE — Patient Instructions (Signed)

## 2017-11-06 NOTE — H&P (Signed)
[name]; [id number]; 1961/02/08   HPI Patient is a 57 year old white male who was referred to my care by Dr. Luan Pulling for evaluation and treatment of a right inguinal hernia.  Patient states he has had the hernia for many years, but recently has been increasing in size and causing him discomfort.  Is made worse with straining.  When it is sticking out, it causes 10 out of 10 pain.  It reduces on its own.  He denies any nausea or vomiting. Past Medical History:  Diagnosis Date  . Anxiety   . Bipolar disorder ([place])   . BPH (benign prostatic hyperplasia)   . Cirrhosis ([place])   . COPD (chronic obstructive pulmonary disease) ([place])   . Depression   . Epicondylitis    hips  . ETOH abuse   . GERD (gastroesophageal reflux disease)   . Head injury   . Hepatitis C   . Hypothyroidism   . Oxygen decrease   . Pancreatitis chronic   . Shortness of breath     Past Surgical History:  Procedure Laterality Date  . APPENDECTOMY  age 53  . BIOPSY  03/22/2017   Procedure: BIOPSY;  Surgeon: Danie Binder, MD;  [facility];  Service: Endoscopy;;  . COLONOSCOPY  07/22/2012   Procedure: COLONOSCOPY;  Surgeon: Rogene Houston, MD;  [facility];  Service: Endoscopy;  Laterality: N/A;  225-moved to 200 Ann notified pt  . ESOPHAGOGASTRODUODENOSCOPY N/A 03/22/2017   Procedure: ESOPHAGOGASTRODUODENOSCOPY (EGD);  Surgeon: Danie Binder, MD;  [facility];  Service: Endoscopy;  Laterality: N/A;  . FASCIOTOMY Left 08/30/2012   Procedure: FASCIOTOMY;  Surgeon: Johnn Hai, MD;  Location: [facility];  Service: Orthopedics;  Laterality: Left;  . FEMUR FRACTURE SURGERY     otif  . I&D EXTREMITY Left 09/03/2012   Procedure: IRRIGATION AND DEBRIDEMENT EXTREMITY;  Surgeon: Johnny Bridge, MD;  Location: [facility];  Service: Orthopedics;  Laterality: Left;  . SKIN SPLIT GRAFT Left 09/08/2012   Procedure:  SPLIT THICKNESS SKIN GRAFT LEFT LEG ;  Surgeon: Rozanna Box, MD;  Location:  [facility];  Service: Orthopedics;  Laterality: Left;  . TOTAL HIP ARTHROPLASTY  1 year ago   right hip-[place]  . TOTAL HIP REVISION Right 10/25/2016   Procedure: Revision right hip constrained liner;  Surgeon: Gaynelle Arabian, MD;  Location: [facility];  Service: Orthopedics;  Laterality: Right;  . TRANSURETHRAL RESECTION OF PROSTATE  05/23/2011   Procedure: TRANSURETHRAL RESECTION OF THE PROSTATE (TURP);  Surgeon: Marissa Nestle;  Location: [facility];  Service: Urology;  Laterality: N/A;  placement of suprapubic catheter    Family History  Problem Relation Age of Onset  . Anesthesia problems Neg Hx   . Hypotension Neg Hx   . Pseudochol deficiency Neg Hx   . Malignant hyperthermia Neg Hx     Current Outpatient Medications on File Prior to Visit  Medication Sig Dispense Refill  . levothyroxine (SYNTHROID, LEVOTHROID) 88 MCG tablet Take 88 mcg by mouth daily before breakfast.    . lithium carbonate 300 MG capsule Take 300 mg by mouth 2 (two) times daily with a meal.    . LORazepam (ATIVAN) 1 MG tablet Take 0.5 tablets (0.5 mg total) by mouth 2 (two) times daily.    . OXYGEN Inhale 2.5 L into the lungs daily as needed (for breathing).     . pantoprazole (PROTONIX) 40 MG tablet Take 40 mg by mouth 2 (two)  times daily before a meal.    . lamoTRIgine (LAMICTAL) 100 MG tablet Take 1 tablet (100 mg total) by mouth 2 (two) times daily. (Patient taking differently: Take 100 mg by mouth daily. ) 60 tablet 0  . levothyroxine (SYNTHROID, LEVOTHROID) 88 MCG tablet Take 1 tablet (88 mcg total) by mouth daily. 30 tablet 0  . lithium carbonate 300 MG capsule Take 1 capsule (300 mg total) by mouth 2 (two) times daily with a meal. 60 capsule 0  . pantoprazole (PROTONIX) 40 MG tablet Take 1 tablet (40 mg total) by mouth 2 (two) times daily before a meal. 60 tablet 3   No current facility-administered medications on file prior to visit.     Allergies  Allergen Reactions  . Penicillins Anaphylaxis    Has  patient had a PCN reaction causing immediate rash, facial/tongue/throat swelling, SOB or lightheadedness with hypotension: No Has patient had a PCN reaction causing severe rash involving mucus membranes or skin necrosis:No Has patient had a PCN reaction that required hospitalization:No Has patient had a PCN reaction occurring within the last 10 years: No If all of the above answers are "NO", then may proceed with Cephalosporin use.   . Vancomycin     redman syndrome  . Codeine Hives  . Tylenol [Acetaminophen] Other (See Comments)    Patient has hepatitis    Social History   Substance and Sexual Activity  Alcohol Use Yes  . Alcohol/week: 1.8 oz  . Types: 3 Cans of beer per week   Comment: 3 beers per day    Social History   Tobacco Use  Smoking Status Current Every Day Smoker  . Packs/day: 1.00  . Years: 30.00  . Pack years: 30.00  . Types: Cigarettes  Smokeless Tobacco Never Used    Review of Systems  Constitutional: Positive for malaise/fatigue.  HENT: Positive for sinus pain.   Eyes: Positive for blurred vision.  Respiratory: Positive for cough, shortness of breath and wheezing.   Cardiovascular: Negative for chest pain.  Gastrointestinal: Positive for abdominal pain.  Genitourinary: Negative.   Musculoskeletal: Positive for back pain, joint pain and neck pain.  Skin: Negative.   Neurological: Positive for headaches.  Endo/Heme/Allergies: Negative.   Psychiatric/Behavioral: Negative.     Objective   Vitals:   11/04/17 1547  BP: (!) 162/96  Pulse: (!) 110  Resp: (!) 22  Temp: 99.6 F (37.6 C)    Physical Exam  Constitutional: He is oriented to person, place, and time. He appears well-developed and well-nourished.  HENT:  Head: Normocephalic and atraumatic.  Cardiovascular: Regular rhythm and normal heart sounds. Exam reveals no gallop and no friction rub.  No murmur heard. Slightly tachycardic.  Pulmonary/Chest: Effort normal and breath sounds  normal. No stridor. No respiratory distress. He has no wheezes. He has no rales.  Abdominal: Soft. Bowel sounds are normal. He exhibits no distension and no mass. There is no tenderness. There is no guarding. A hernia is present.  Easily reducible right inguinal hernia  Musculoskeletal:  Walks with a cane  Neurological: He is alert and oriented to person, place, and time.  Skin: Skin is warm and dry.  Vitals reviewed.  Dr. Luan Pulling' note reviewed. Assessment  Right inguinal hernia, COPD Plan   Patient is scheduled for a right inguinal herniorrhaphy with mesh on 11/24/2017.  I told him that this would probably be done under spinal anesthetic.  The risks and benefits of the procedure including bleeding, infection, cardiopulmonary difficulties, mesh use, and  the possibility of recurrence of the hernia were fully explained to the patient, who gave informed consent.

## 2017-11-18 NOTE — Patient Instructions (Signed)
[name]  11/18/2017     @PREFPERIOPPHARMACY @   Your procedure is scheduled on 11/24/2017.  Report to [name] at 6:15 A.M.  Call this number if you have problems the morning of surgery:  [phone]   Remember: Nothing to eat or drink after midnight    Take these medicines the morning of surgery with A SIP OF WATER Lamictal, Synthroid, Lithium, Dulara, Protonix, Phenergan    Do not wear jewelry, make-up or nail polish.  Do not wear lotions, powders, or perfumes, or deodorant.  Do not shave 48 hours prior to surgery.  Men may shave face and neck.  Do not bring valuables to the hospital.  [facility] is not responsible for any belongings or valuables.  Contacts, dentures or bridgework may not be worn into surgery.  Leave your suitcase in the car.  After surgery it may be brought to your room.  For patients admitted to the hospital, discharge time will be determined by your treatment team.  Patients discharged the day of surgery will not be allowed to drive home.    Please read over the following fact sheets that you were given. Surgical Site Infection Prevention and Anesthesia Post-op Instructions     PATIENT INSTRUCTIONS POST-ANESTHESIA  IMMEDIATELY FOLLOWING SURGERY:  Do not drive or operate machinery for the first twenty four hours after surgery.  Do not make any important decisions for twenty four hours after surgery or while taking narcotic pain medications or sedatives.  If you develop intractable nausea and vomiting or a severe headache please notify your doctor immediately.  FOLLOW-UP:  Please make an appointment with your surgeon as instructed. You do not need to follow up with anesthesia unless specifically instructed to do so.  WOUND CARE INSTRUCTIONS (if applicable):  Keep a dry clean dressing on the anesthesia/puncture wound site if there is drainage.  Once the wound has quit draining you may leave it open to air.  Generally you should leave the bandage  intact for twenty four hours unless there is drainage.  If the epidural site drains for more than 36-48 hours please call the anesthesia department.  QUESTIONS?:  Please feel free to call your physician or the hospital operator if you have any questions, and they will be happy to assist you.      Laparoscopic Inguinal Hernia Repair, Adult Laparoscopic inguinal hernia repair is a surgical procedure to repair a small weak spot in the groin muscles that allows fat or intestine from inside the abdomen to bulge out (inguinal hernia). This procedure may be planned, or it may be an emergency procedure. During the procedure, tissue that has bulged out is moved back into place, and the opening in the groin muscles is repaired. This is done through three small incisions in the abdomen. A thin tube with a light and camera on the end (laparoscope) will be used to help perform the procedure. Tell a health care provider about:  Any allergies you have.  All medicines you are taking, including vitamins, herbs, eye drops, creams, and over-the-counter medicines.  Any problems you or family members have had with anesthetic medicines.  Any blood disorders you have.  Any surgeries you have had.  Any medical conditions you have.  Whether you are pregnant or may be pregnant. What are the risks? Generally, this is a safe procedure. However, problems may occur, including:  Infection.  Bleeding.  Allergic reactions to medicines.  Damage to other structures or organs.  Long-term pain  and swelling of the scrotum, in men.  Testicle damage in men.  Inability to completely empty the bladder (urinary retention).  A collection of fluid that builds up under the skin (seroma).  The hernia coming back (recurrence).  What happens before the procedure? Medicines  Ask your health care provider about: ? Changing or stopping your regular medicines. This is especially important if you are taking diabetes  medicines or blood thinners. ? Taking over-the-counter medicines, vitamins, herbs, and supplements. ? Taking medicines such as aspirin and ibuprofen. These medicines can thin your blood. Do not take these medicines unless your health care provider tells you to take them.  You may be given antibiotic medicine to help prevent an infection. Staying hydrated Follow instructions from your health care provider about hydration, which may include:  Up to 2 hours before the procedure - you may continue to drink clear liquids, such as water, clear fruit juice, black coffee, and plain tea.  Eating and drinking restrictions Follow instructions from your health care provider about eating and drinking, which may include:  8 hours before the procedure - stop eating heavy meals or foods such as meat, fried foods, or fatty foods.  6 hours before the procedure - stop eating light meals or foods, such as toast or cereal.  6 hours before the procedure - stop drinking milk or drinks that contain milk.  2 hours before the procedure - stop drinking clear liquids.  General instructions  Do not use any products that contain nicotine or tobacco, such as cigarettes and e-cigarettes. If you need help quitting, ask your health care provider.  You may be asked to shower with a germ-killing soap.  Plan to have someone take you home from the hospital or clinic.  Plan to have a responsible adult care for you for at least 24 hours after you leave the hospital or clinic. This is important. What happens during the procedure?  To lower your risk of infection: ? Your health care team will wash or sanitize their hands. ? Hair may be removed from the surgical area. ? Your skin will be washed with soap.  An IV will be inserted into one of your veins.  You will be given one or more of the following: ? A medicine to help you relax (sedative). ? A medicine to make you fall asleep (general anesthetic).  Three small  incisions will be made in your abdomen.  Your abdomen will be inflated with carbon dioxide gas to make the surgical area easier to see.  A laparoscope and surgical instruments will be inserted through the incisions. The laparoscope will send images of the inside of your abdomen to a monitor in the room.  Tissue that is bulging through the hernia may be removed or moved back into its normal place.  The hernia opening will be closed with a sheet of surgical mesh.  The surgical instruments and laparoscope will be removed.  Your incisions will be closed with stitches (sutures) and adhesive strips.  A bandage (dressing) will be placed over your incisions. The procedure may vary among health care providers and hospitals. What happens after the procedure?  Your blood pressure, heart rate, breathing rate, and blood oxygen level will be monitored until the medicines you were given have worn off.  You will be given pain medicine as needed.  You may continue to receive medicines and fluids through an IV. The IV will be removed after you can drink fluids well.  You will  be encouraged to get up and move around, and to take deep breaths frequently.  Do not drive for 24 hours if you were given a sedative during your procedure. Summary  Laparoscopic inguinal hernia repair is a surgical procedure to repair a small weak spot in the groin muscles that allows fat or intestine from inside the abdomen to bulge out (inguinal hernia).  This procedure is done through three small incisions in the abdomen. A thin tube with a light and camera on the end (laparoscope) will be used to help perform the procedure.  After the procedure, you will be encouraged to get up and move around, and to take deep breaths frequently. This information is not intended to replace advice given to you by your health care provider. Make sure you discuss any questions you have with your health care provider. Document Released:  09/19/2016 Document Revised: 09/19/2016 Document Reviewed: 09/19/2016 Elsevier Interactive Patient Education  2018 [employer].

## 2017-11-19 ENCOUNTER — Other Ambulatory Visit: Payer: Self-pay

## 2017-11-19 ENCOUNTER — Encounter (HOSPITAL_COMMUNITY)
Admission: RE | Admit: 2017-11-19 | Discharge: 2017-11-19 | Disposition: A | Discharge status: Home / Self Care | Payer: Medicaid Other | Source: Ambulatory Visit | Attending: General Surgery | Admitting: General Surgery

## 2017-11-19 ENCOUNTER — Encounter (HOSPITAL_COMMUNITY): Payer: Self-pay

## 2017-11-19 DIAGNOSIS — K7460 Unspecified cirrhosis of liver: Secondary | ICD-10-CM | POA: Diagnosis not present

## 2017-11-19 DIAGNOSIS — Z01812 Encounter for preprocedural laboratory examination: Secondary | ICD-10-CM | POA: Diagnosis present

## 2017-11-19 DIAGNOSIS — F319 Bipolar disorder, unspecified: Secondary | ICD-10-CM | POA: Diagnosis not present

## 2017-11-19 DIAGNOSIS — Z9889 Other specified postprocedural states: Secondary | ICD-10-CM | POA: Insufficient documentation

## 2017-11-19 DIAGNOSIS — F17210 Nicotine dependence, cigarettes, uncomplicated: Secondary | ICD-10-CM | POA: Diagnosis not present

## 2017-11-19 DIAGNOSIS — K4090 Unilateral inguinal hernia, without obstruction or gangrene, not specified as recurrent: Secondary | ICD-10-CM | POA: Insufficient documentation

## 2017-11-19 DIAGNOSIS — J449 Chronic obstructive pulmonary disease, unspecified: Secondary | ICD-10-CM | POA: Diagnosis not present

## 2017-11-19 DIAGNOSIS — Z880 Allergy status to penicillin: Secondary | ICD-10-CM | POA: Insufficient documentation

## 2017-11-19 DIAGNOSIS — K219 Gastro-esophageal reflux disease without esophagitis: Secondary | ICD-10-CM | POA: Insufficient documentation

## 2017-11-19 DIAGNOSIS — Z79899 Other long term (current) drug therapy: Secondary | ICD-10-CM | POA: Diagnosis not present

## 2017-11-19 DIAGNOSIS — B1920 Unspecified viral hepatitis C without hepatic coma: Secondary | ICD-10-CM | POA: Diagnosis not present

## 2017-11-19 DIAGNOSIS — Z888 Allergy status to other drugs, medicaments and biological substances status: Secondary | ICD-10-CM | POA: Insufficient documentation

## 2017-11-24 ENCOUNTER — Encounter (HOSPITAL_COMMUNITY): Payer: Self-pay | Admitting: *Deleted

## 2017-11-24 ENCOUNTER — Ambulatory Visit (HOSPITAL_COMMUNITY)
Admission: RE | Admit: 2017-11-24 | Discharge: 2017-11-24 | Disposition: A | Discharge status: Home / Self Care | Payer: Medicaid Other | Source: Ambulatory Visit | Attending: General Surgery | Admitting: General Surgery

## 2017-11-24 ENCOUNTER — Encounter (HOSPITAL_COMMUNITY)
Admission: RE | Disposition: A | Discharge status: Home / Self Care | Payer: Self-pay | Source: Ambulatory Visit | Attending: General Surgery

## 2017-11-24 ENCOUNTER — Ambulatory Visit (HOSPITAL_COMMUNITY): Payer: Medicaid Other | Admitting: Anesthesiology

## 2017-11-24 DIAGNOSIS — Z79890 Hormone replacement therapy (postmenopausal): Secondary | ICD-10-CM | POA: Insufficient documentation

## 2017-11-24 DIAGNOSIS — J449 Chronic obstructive pulmonary disease, unspecified: Secondary | ICD-10-CM | POA: Insufficient documentation

## 2017-11-24 DIAGNOSIS — F319 Bipolar disorder, unspecified: Secondary | ICD-10-CM | POA: Diagnosis not present

## 2017-11-24 DIAGNOSIS — Z96641 Presence of right artificial hip joint: Secondary | ICD-10-CM | POA: Diagnosis not present

## 2017-11-24 DIAGNOSIS — Z881 Allergy status to other antibiotic agents status: Secondary | ICD-10-CM | POA: Insufficient documentation

## 2017-11-24 DIAGNOSIS — Z885 Allergy status to narcotic agent status: Secondary | ICD-10-CM | POA: Insufficient documentation

## 2017-11-24 DIAGNOSIS — B1920 Unspecified viral hepatitis C without hepatic coma: Secondary | ICD-10-CM | POA: Insufficient documentation

## 2017-11-24 DIAGNOSIS — Z880 Allergy status to penicillin: Secondary | ICD-10-CM | POA: Diagnosis not present

## 2017-11-24 DIAGNOSIS — Z886 Allergy status to analgesic agent status: Secondary | ICD-10-CM | POA: Diagnosis not present

## 2017-11-24 DIAGNOSIS — K7460 Unspecified cirrhosis of liver: Secondary | ICD-10-CM | POA: Diagnosis not present

## 2017-11-24 DIAGNOSIS — Z79899 Other long term (current) drug therapy: Secondary | ICD-10-CM | POA: Diagnosis not present

## 2017-11-24 DIAGNOSIS — E039 Hypothyroidism, unspecified: Secondary | ICD-10-CM | POA: Insufficient documentation

## 2017-11-24 DIAGNOSIS — F419 Anxiety disorder, unspecified: Secondary | ICD-10-CM | POA: Diagnosis not present

## 2017-11-24 DIAGNOSIS — K4090 Unilateral inguinal hernia, without obstruction or gangrene, not specified as recurrent: Secondary | ICD-10-CM | POA: Diagnosis not present

## 2017-11-24 DIAGNOSIS — N400 Enlarged prostate without lower urinary tract symptoms: Secondary | ICD-10-CM | POA: Insufficient documentation

## 2017-11-24 DIAGNOSIS — F17210 Nicotine dependence, cigarettes, uncomplicated: Secondary | ICD-10-CM | POA: Insufficient documentation

## 2017-11-24 DIAGNOSIS — K219 Gastro-esophageal reflux disease without esophagitis: Secondary | ICD-10-CM | POA: Insufficient documentation

## 2017-11-24 DIAGNOSIS — Z9981 Dependence on supplemental oxygen: Secondary | ICD-10-CM | POA: Diagnosis not present

## 2017-11-24 HISTORY — PX: INGUINAL HERNIA REPAIR: SHX194

## 2017-11-24 SURGERY — REPAIR, HERNIA, INGUINAL, ADULT
Anesthesia: General | Site: Inguinal | Laterality: Right

## 2017-11-24 MED ORDER — KETOROLAC TROMETHAMINE 30 MG/ML IJ SOLN
30.0000 mg | Freq: Once | INTRAMUSCULAR | Status: AC
Start: 2017-11-24 — End: 2017-11-24
  Administered 2017-11-24: 30 mg via INTRAVENOUS

## 2017-11-24 MED ORDER — EPHEDRINE SULFATE 50 MG/ML IJ SOLN
INTRAMUSCULAR | Status: AC
  Filled 2017-11-24: qty 1

## 2017-11-24 MED ORDER — BUPIVACAINE LIPOSOME 1.3 % IJ SUSP
INTRAMUSCULAR | Status: AC
  Filled 2017-11-24: qty 20

## 2017-11-24 MED ORDER — MEPERIDINE HCL 50 MG/ML IJ SOLN: 6.2500 mg | INTRAMUSCULAR | Status: DC | PRN

## 2017-11-24 MED ORDER — TRAMADOL HCL 50 MG PO TABS: 50.0000 mg | ORAL_TABLET | Freq: Four times a day (QID) | ORAL | 0 refills | Status: DC | PRN

## 2017-11-24 MED ORDER — 0.9 % SODIUM CHLORIDE (POUR BTL) OPTIME
TOPICAL | Status: DC | PRN
  Administered 2017-11-24: 1000 mL

## 2017-11-24 MED ORDER — PROMETHAZINE HCL 25 MG/ML IJ SOLN: 6.2500 mg | INTRAMUSCULAR | Status: DC | PRN

## 2017-11-24 MED ORDER — LACTATED RINGERS IV SOLN: INTRAVENOUS | Status: DC

## 2017-11-24 MED ORDER — PROPOFOL 10 MG/ML IV BOLUS
INTRAVENOUS | Status: DC | PRN
  Administered 2017-11-24: 150 mg via INTRAVENOUS
  Administered 2017-11-24: 20 mg via INTRAVENOUS

## 2017-11-24 MED ORDER — KETOROLAC TROMETHAMINE 30 MG/ML IJ SOLN
INTRAMUSCULAR | Status: AC
  Filled 2017-11-24: qty 1

## 2017-11-24 MED ORDER — CHLORHEXIDINE GLUCONATE CLOTH 2 % EX PADS: 6.0000 | MEDICATED_PAD | Freq: Once | CUTANEOUS | Status: DC

## 2017-11-24 MED ORDER — BUPIVACAINE LIPOSOME 1.3 % IJ SUSP
INTRAMUSCULAR | Status: DC | PRN
  Administered 2017-11-24: 20 mL

## 2017-11-24 MED ORDER — HYDROMORPHONE HCL 1 MG/ML IJ SOLN: 0.2500 mg | INTRAMUSCULAR | Status: DC | PRN

## 2017-11-24 MED ORDER — FENTANYL CITRATE (PF) 250 MCG/5ML IJ SOLN
INTRAMUSCULAR | Status: AC
  Filled 2017-11-24: qty 5

## 2017-11-24 MED ORDER — EPHEDRINE SULFATE 50 MG/ML IJ SOLN
INTRAMUSCULAR | Status: DC | PRN
  Administered 2017-11-24: 10 mg via INTRAVENOUS
  Administered 2017-11-24: 5 mg via INTRAVENOUS

## 2017-11-24 MED ORDER — LACTATED RINGERS IV SOLN
INTRAVENOUS | Status: DC
  Administered 2017-11-24: 07:00:00 via INTRAVENOUS

## 2017-11-24 MED ORDER — SODIUM CHLORIDE 0.9 % IJ SOLN
INTRAMUSCULAR | Status: AC
  Filled 2017-11-24: qty 10

## 2017-11-24 MED ORDER — PROPOFOL 10 MG/ML IV BOLUS
INTRAVENOUS | Status: AC
  Filled 2017-11-24: qty 40

## 2017-11-24 MED ORDER — CLINDAMYCIN PHOSPHATE 900 MG/50ML IV SOLN
900.0000 mg | INTRAVENOUS | Status: AC
  Administered 2017-11-24: 900 mg via INTRAVENOUS
  Filled 2017-11-24: qty 50

## 2017-11-24 MED ORDER — PHENYLEPHRINE HCL 10 MG/ML IJ SOLN
INTRAMUSCULAR | Status: DC | PRN
  Administered 2017-11-24 (×3): 40 ug via INTRAVENOUS

## 2017-11-24 MED ORDER — FENTANYL CITRATE (PF) 100 MCG/2ML IJ SOLN
INTRAMUSCULAR | Status: DC | PRN
  Administered 2017-11-24 (×2): 25 ug via INTRAVENOUS
  Administered 2017-11-24: 50 ug via INTRAVENOUS

## 2017-11-24 SURGICAL SUPPLY — 36 items
ADH SKN CLS APL DERMABOND .7 (GAUZE/BANDAGES/DRESSINGS) ×1
CLOTH BEACON ORANGE TIMEOUT ST (SAFETY) ×3 IMPLANT
COVER LIGHT HANDLE STERIS (MISCELLANEOUS) ×6 IMPLANT
DERMABOND ADVANCED (GAUZE/BANDAGES/DRESSINGS) ×2
DERMABOND ADVANCED .7 DNX12 (GAUZE/BANDAGES/DRESSINGS) ×1 IMPLANT
DRAIN PENROSE 18X1/2 LTX STRL (DRAIN) ×3 IMPLANT
ELECT REM PT RETURN 9FT ADLT (ELECTROSURGICAL) ×3
ELECTRODE REM PT RTRN 9FT ADLT (ELECTROSURGICAL) ×1 IMPLANT
GAUZE SPONGE 4X4 12PLY STRL (GAUZE/BANDAGES/DRESSINGS) ×3 IMPLANT
GLOVE BIOGEL M 7.0 STRL (GLOVE) ×4 IMPLANT
GLOVE BIOGEL PI IND STRL 7.0 (GLOVE) ×2 IMPLANT
GLOVE BIOGEL PI INDICATOR 7.0 (GLOVE) ×6
GLOVE SURG SS PI 7.5 STRL IVOR (GLOVE) ×3 IMPLANT
GOWN STRL REUS W/ TWL XL LVL3 (GOWN DISPOSABLE) ×1 IMPLANT
GOWN STRL REUS W/TWL LRG LVL3 (GOWN DISPOSABLE) ×6 IMPLANT
GOWN STRL REUS W/TWL XL LVL3 (GOWN DISPOSABLE) ×3
INST SET MINOR GENERAL (KITS) ×3 IMPLANT
KIT TURNOVER KIT A (KITS) ×3 IMPLANT
MANIFOLD NEPTUNE II (INSTRUMENTS) ×3 IMPLANT
MESH HERNIA 1.6X1.9 PLUG LRG (Mesh General) IMPLANT
MESH HERNIA PLUG LRG (Mesh General) ×2 IMPLANT
NDL HYPO 21X1.5 SAFETY (NEEDLE) ×1 IMPLANT
NEEDLE HYPO 21X1.5 SAFETY (NEEDLE) ×3 IMPLANT
NS IRRIG 1000ML POUR BTL (IV SOLUTION) ×3 IMPLANT
PACK MINOR (CUSTOM PROCEDURE TRAY) ×3 IMPLANT
PAD ARMBOARD 7.5X6 YLW CONV (MISCELLANEOUS) ×3 IMPLANT
SET BASIN LINEN APH (SET/KITS/TRAYS/PACK) ×3 IMPLANT
SOL PREP PROV IODINE SCRUB 4OZ (MISCELLANEOUS) ×3 IMPLANT
SUT MNCRL AB 4-0 PS2 18 (SUTURE) ×3 IMPLANT
SUT NOVA NAB GS-22 2 2-0 T-19 (SUTURE) ×6 IMPLANT
SUT VIC AB 2-0 CT1 27 (SUTURE) ×3
SUT VIC AB 2-0 CT1 TAPERPNT 27 (SUTURE) ×1 IMPLANT
SUT VIC AB 3-0 SH 27 (SUTURE) ×3
SUT VIC AB 3-0 SH 27X BRD (SUTURE) ×1 IMPLANT
SUT VICRYL AB 3 0 TIES (SUTURE) ×2 IMPLANT
SYR 20CC LL (SYRINGE) ×3 IMPLANT

## 2017-11-24 NOTE — Discharge Instructions (Signed)
Open Hernia Repair, Adult, Care After °This sheet gives you information about how to care for yourself after your procedure. Your health care provider may also give you more specific instructions. If you have problems or questions, contact your health care provider. °What can I expect after the procedure? °After the procedure, it is common to have: °· Mild discomfort. °· Slight bruising. °· Minor swelling. °· Pain in the abdomen. ° °Follow these instructions at home: °Incision care ° °· Follow instructions from your health care provider about how to take care of your incision area. Make sure you: °? Wash your hands with soap and water before you change your bandage (dressing). If soap and water are not available, use hand sanitizer. °? Change your dressing as told by your health care provider. °? Leave stitches (sutures), skin glue, or adhesive strips in place. These skin closures may need to stay in place for 2 weeks or longer. If adhesive strip edges start to loosen and curl up, you may trim the loose edges. Do not remove adhesive strips completely unless your health care provider tells you to do that. °· Check your incision area every day for signs of infection. Check for: °? More redness, swelling, or pain. °? More fluid or blood. °? Warmth. °? Pus or a bad smell. °Activity °· Do not drive or use heavy machinery while taking prescription pain medicine. Do not drive until your health care provider approves. °· Until your health care provider approves: °? Do not lift anything that is heavier than 10 lb (4.5 kg). °? Do not play contact sports. °· Return to your normal activities as told by your health care provider. Ask your health care provider what activities are safe. °General instructions °· To prevent or treat constipation while you are taking prescription pain medicine, your health care provider may recommend that you: °? Drink enough fluid to keep your urine clear or pale yellow. °? Take over-the-counter or  prescription medicines. °? Eat foods that are high in fiber, such as fresh fruits and vegetables, whole grains, and beans. °? Limit foods that are high in fat and processed sugars, such as fried and sweet foods. °· Take over-the-counter and prescription medicines only as told by your health care provider. °· Do not take tub baths or go swimming until your health care provider approves. °· Keep all follow-up visits as told by your health care provider. This is important. °Contact a health care provider if: °· You develop a rash. °· You have more redness, swelling, or pain around your incision. °· You have more fluid or blood coming from your incision. °· Your incision feels warm to the touch. °· You have pus or a bad smell coming from your incision. °· You have a fever or chills. °· You have blood in your stool (feces). °· You have not had a bowel movement in 2-3 days. °· Your pain is not controlled with medicine. °Get help right away if: °· You have chest pain or shortness of breath. °· You feel light-headed or feel faint. °· You have severe pain. °· You vomit and your pain is worse. °This information is not intended to replace advice given to you by your health care provider. Make sure you discuss any questions you have with your health care provider. °Document Released: 12/28/2004 Document Revised: 12/29/2015 Document Reviewed: 11/22/2015 °Elsevier Interactive Patient Education © 2018 Else[employer] Inc[employer] ° °

## 2017-11-24 NOTE — Anesthesia Procedure Notes (Signed)
Procedure Name: LMA Insertion Date/Time: 11/24/2017 7:34 AM Performed by: Vista Deck, CRNA Pre-anesthesia Checklist: Patient identified, Patient being monitored, Emergency Drugs available, Timeout performed and Suction available Patient Re-evaluated:Patient Re-evaluated prior to induction Oxygen Delivery Method: Circle System Utilized Preoxygenation: Pre-oxygenation with 100% oxygen Induction Type: IV induction Ventilation: Mask ventilation without difficulty LMA: LMA inserted LMA Size: 4.0 Number of attempts: 1 Placement Confirmation: positive ETCO2 and breath sounds checked- equal and bilateral Tube secured with: Tape Dental Injury: Teeth and Oropharynx as per pre-operative assessment

## 2017-11-24 NOTE — Interval H&P Note (Signed)
History and Physical Interval Note:  11/24/2017 7:13 AM  [name]  has presented today for surgery, with the diagnosis of right inguinal hernia  The various methods of treatment have been discussed with the patient and family. After consideration of risks, benefits and other options for treatment, the patient has consented to  Procedure(s): HERNIA REPAIR INGUINAL ADULT WITH MESH (Right) as a surgical intervention .  The patient's history has been reviewed, patient examined, no change in status, stable for surgery.  I have reviewed the patient's chart and labs.  Questions were answered to the patient's satisfaction.     Aviva Signs

## 2017-11-24 NOTE — Transfer of Care (Signed)
Immediate Anesthesia Transfer of Care Note  Patient: [name]  Procedure(s) Performed: HERNIA REPAIR INGUINAL ADULT WITH MESH (Right Inguinal)  Patient Location: PACU  Anesthesia Type:General  Level of Consciousness: awake, alert  and patient cooperative  Airway & Oxygen Therapy: Patient Spontanous Breathing and Patient connected to nasal cannula oxygen  Post-op Assessment: Report given to RN and Post -op Vital signs reviewed and stable  Post vital signs: Reviewed and stable  Last Vitals:  Vitals Value Taken Time  BP    Temp    Pulse 99 11/24/2017  8:22 AM  Resp    SpO2 86 % 11/24/2017  8:22 AM  Vitals shown include unvalidated device data.  Last Pain:  Vitals:   11/24/17 0634  TempSrc: Oral  PainSc: 0-No pain      Patients Stated Pain Goal: 4 (01/77/93 9030)  Complications: No apparent anesthesia complications

## 2017-11-24 NOTE — Anesthesia Postprocedure Evaluation (Signed)
Anesthesia Post Note  Patient: [name]  Procedure(s) Performed: HERNIA REPAIR INGUINAL ADULT WITH MESH (Right Inguinal)  Patient location during evaluation: PACU Anesthesia Type: General Level of consciousness: awake and alert and patient cooperative Pain management: satisfactory to patient Vital Signs Assessment: post-procedure vital signs reviewed and stable Respiratory status: spontaneous breathing and patient connected to nasal cannula oxygen Cardiovascular status: stable Postop Assessment: no apparent nausea or vomiting Anesthetic complications: no     Last Vitals:  Vitals:   11/24/17 0825 11/24/17 0830  BP: (!) 128/94 125/81  Pulse: (!) 109 (!) 103  Resp: (!) 24 18  Temp:    SpO2: 99% 100%    Last Pain:  Vitals:   11/24/17 0830  TempSrc:   PainSc: Asleep                 Paulla Mcclaskey

## 2017-11-24 NOTE — Anesthesia Preprocedure Evaluation (Signed)
Anesthesia Evaluation  Patient identified by MRN, date of birth, ID band Patient awake    Reviewed: Allergy & Precautions, H&P , NPO status , Patient's Chart, lab work & pertinent test results, reviewed documented beta blocker date and time   Airway    Neck ROM: full    Dental no notable dental hx. (+) Edentulous Lower, Edentulous Upper   Pulmonary neg pulmonary ROS, shortness of breath, COPD, Current Smoker,    Pulmonary exam normal breath sounds clear to auscultation + decreased breath sounds      Cardiovascular Exercise Tolerance: Good negative cardio ROS  Ventricular Tachycardia  Rhythm:regular Rate:Normal     Neuro/Psych PSYCHIATRIC DISORDERS Anxiety Depression Bipolar Disorder CVA, No Residual Symptoms negative neurological ROS  negative psych ROS   GI/Hepatic negative GI ROS, Neg liver ROS, GERD  ,(+) Hepatitis -, B  Endo/Other  negative endocrine ROSHypothyroidism   Renal/GU negative Renal ROS  negative genitourinary   Musculoskeletal   Abdominal   Peds  Hematology negative hematology ROS (+) anemia ,   Anesthesia Other Findings Chronic pancreatitis New report of 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 Chronic tobacco and MJ abuse 5\' 8"  and 120# "now"  Reproductive/Obstetrics negative OB ROS                             Anesthesia Physical Anesthesia Plan  ASA: IV  Anesthesia Plan: General   Post-op Pain Management:    Induction:   PONV Risk Score and Plan:   Airway Management Planned:   Additional Equipment:   Intra-op Plan:   Post-operative Plan:   Informed Consent: I have reviewed the patients History and Physical, chart, labs and discussed the procedure including the risks, benefits and alternatives for the proposed anesthesia with the patient or authorized representative who has indicated his/her understanding and acceptance.   Dental Advisory Given  Plan Discussed with: CRNA and  Anesthesiologist  Anesthesia Plan Comments:         Anesthesia Quick Evaluation

## 2017-11-24 NOTE — Op Note (Signed)
Patient:  [name]  DOB:  1961/01/13  MRN:  [id number]   Preop Diagnosis: Right inguinal hernia  Postop Diagnosis: Same  Procedure: Right inguinal herniorrhaphy with mesh  Surgeon: Aviva Signs, MD  Anes: General  Indications: Patient is a 57 year old white male who presents with a symptomatic right inguinal hernia.  The risks and benefits of the procedure including bleeding, infection, mesh use, the possibility of recurrence of the hernia were fully explained to the patient, who gave informed consent.  Procedure note: Patient was placed in supine position.  After general anesthesia was administered, the right groin region was prepped and draped using the usual sterile technique with Betadine.  Surgical site confirmation was performed.  Incision was made in the right groin region down to the external Bleich aponeurosis.  The aponeurosis was incised to the external ring.  A Penrose drain was placed around the spermatic cord.  The vase deferens was noted within the spermatic cord.  The ileal inguinal nerve was identified and retracted inferiorly from the operative field.  The patient had a direct hernia.  This was incised at its base and inverted.  A large Bard PerFix plug was then inserted and secured circumferentially to the transversalis fascia using 2-0 Novafil interrupted sutures.  An onlay patch was then placed along the floor of the inguinal canal and secured superiorly to the conjoined tendon and inferiorly to the shelving edge of Poupart's ligament using 2-0 Novafil interrupted sutures.  The internal ring was re-created using a 2-0 Novafil interrupted suture.  The external Bleich aponeurosis was reapproximated using a 2-0 Vicryl running suture.  The subcutaneous layer was reapproximated using 3-0 Vicryl interrupted suture.  The skin was closed using a 4-0 Monocryl subcuticular suture.  Exparel was instilled into the surrounding wound.  Dermabond was applied.  All tape and needle  counts were correct at the end of the procedure.  Patient was awakened and transferred to PACU in stable condition.  Complications: None  EBL: Minimal  Specimen: None

## 2017-11-25 ENCOUNTER — Encounter (HOSPITAL_COMMUNITY): Payer: Self-pay | Admitting: General Surgery

## 2017-12-02 ENCOUNTER — Ambulatory Visit: Payer: Self-pay | Admitting: General Surgery

## 2017-12-09 ENCOUNTER — Ambulatory Visit (INDEPENDENT_AMBULATORY_CARE_PROVIDER_SITE_OTHER): Payer: Self-pay | Admitting: General Surgery

## 2017-12-09 ENCOUNTER — Encounter: Payer: Self-pay | Admitting: General Surgery

## 2017-12-09 VITALS — BP 179/94 | HR 77 | Temp 97.5°F | Resp 22 | Ht 67.0 in | Wt 127.0 lb

## 2017-12-09 DIAGNOSIS — Z09 Encounter for follow-up examination after completed treatment for conditions other than malignant neoplasm: Secondary | ICD-10-CM

## 2017-12-09 MED ORDER — OXYCODONE-ACETAMINOPHEN 7.5-325 MG PO TABS: 1.0000 | ORAL_TABLET | Freq: Four times a day (QID) | ORAL | 0 refills | Status: DC | PRN

## 2017-12-09 NOTE — Progress Notes (Signed)
Subjective:     [name]  Status post right inguinal herniorrhaphy with mesh.  Patient is having moderate pain.  The tramadol did not help.  He states he has taken Percocet before without difficulty.  He does have acetaminophen and codeine listed as allergies, but he said the Percocet was fine.  He has 6 out of 10 pain. Objective:    BP (!) 179/94   Pulse 77   Temp (!) 97.5 F (36.4 C) (Oral)   Resp (!) 22   Ht 5\' 7"  (1.702 m)   Wt 127 lb (57.6 kg)   BMI 19.89 kg/m   General:  alert, cooperative and no distress  Abdomen soft, incision healing well.  Minimal swelling present.     Assessment:    Doing well postoperatively. Normal postoperative pain.    Plan:  As patient was insistent that he had taken Percocet without difficulty in the past, Percocet was ordered for his postoperative pain.  He may increase his activity as able.  Follow-up here as needed.

## 2017-12-10 ENCOUNTER — Telehealth (INDEPENDENT_AMBULATORY_CARE_PROVIDER_SITE_OTHER): Payer: Self-pay | Admitting: *Deleted

## 2017-12-10 NOTE — Telephone Encounter (Signed)
Per Hoyle Sauer  , Dr.Rehman ask that the patient be rescheduled for TCS and with Propofol.

## 2017-12-11 ENCOUNTER — Encounter (INDEPENDENT_AMBULATORY_CARE_PROVIDER_SITE_OTHER): Payer: Self-pay | Admitting: *Deleted

## 2017-12-11 ENCOUNTER — Other Ambulatory Visit (INDEPENDENT_AMBULATORY_CARE_PROVIDER_SITE_OTHER): Payer: Self-pay | Admitting: *Deleted

## 2017-12-11 DIAGNOSIS — Z86010 Personal history of colonic polyps: Secondary | ICD-10-CM

## 2017-12-11 NOTE — Telephone Encounter (Signed)
TCS changed to propofol, new instruction sheet mailed to patient

## 2017-12-16 ENCOUNTER — Emergency Department (HOSPITAL_COMMUNITY)
Admission: EM | Admit: 2017-12-16 | Discharge: 2017-12-16 | Disposition: A | Discharge status: Home / Self Care | Payer: No Typology Code available for payment source | Attending: Emergency Medicine | Admitting: Emergency Medicine

## 2017-12-16 ENCOUNTER — Emergency Department (HOSPITAL_COMMUNITY): Payer: No Typology Code available for payment source

## 2017-12-16 ENCOUNTER — Other Ambulatory Visit: Payer: Self-pay

## 2017-12-16 ENCOUNTER — Encounter (HOSPITAL_COMMUNITY): Payer: Self-pay | Admitting: *Deleted

## 2017-12-16 DIAGNOSIS — R079 Chest pain, unspecified: Secondary | ICD-10-CM | POA: Insufficient documentation

## 2017-12-16 DIAGNOSIS — R109 Unspecified abdominal pain: Secondary | ICD-10-CM | POA: Insufficient documentation

## 2017-12-16 DIAGNOSIS — R51 Headache: Secondary | ICD-10-CM | POA: Diagnosis not present

## 2017-12-16 DIAGNOSIS — R748 Abnormal levels of other serum enzymes: Secondary | ICD-10-CM

## 2017-12-16 LAB — CBC
HCT: 45.2 % (ref 39.0–52.0)
Hemoglobin: 14.5 g/dL (ref 13.0–17.0)
MCH: 31.1 pg (ref 26.0–34.0)
MCHC: 32.1 g/dL (ref 30.0–36.0)
MCV: 97 fL (ref 78.0–100.0)
PLATELETS: 311 10*3/uL (ref 150–400)
RBC: 4.66 MIL/uL (ref 4.22–5.81)
RDW: 12.6 % (ref 11.5–15.5)
WBC: 7.7 10*3/uL (ref 4.0–10.5)

## 2017-12-16 LAB — I-STAT CG4 LACTIC ACID, ED: LACTIC ACID, VENOUS: 1.06 mmol/L (ref 0.5–1.9)

## 2017-12-16 LAB — PROTIME-INR
INR: 0.96
Prothrombin Time: 12.7 seconds (ref 11.4–15.2)

## 2017-12-16 LAB — COMPREHENSIVE METABOLIC PANEL
ALK PHOS: 226 U/L — AB (ref 38–126)
ALT: 50 U/L — ABNORMAL HIGH (ref 0–44)
ANION GAP: 9 (ref 5–15)
AST: 43 U/L — AB (ref 15–41)
Albumin: 3.8 g/dL (ref 3.5–5.0)
BILIRUBIN TOTAL: 1.2 mg/dL (ref 0.3–1.2)
BUN: 7 mg/dL (ref 6–20)
CALCIUM: 9.6 mg/dL (ref 8.9–10.3)
CO2: 27 mmol/L (ref 22–32)
Chloride: 103 mmol/L (ref 98–111)
Creatinine, Ser: 0.95 mg/dL (ref 0.61–1.24)
GFR calc Af Amer: >60 mL/min (ref >60)
GFR calc non Af Amer: >60 mL/min (ref >60)
GLUCOSE: 113 mg/dL — AB (ref 70–99)
Potassium: 4.2 mmol/L (ref 3.5–5.1)
Sodium: 139 mmol/L (ref 135–145)
TOTAL PROTEIN: 6.9 g/dL (ref 6.5–8.1)

## 2017-12-16 LAB — CDS SEROLOGY

## 2017-12-16 LAB — SAMPLE TO BLOOD BANK

## 2017-12-16 LAB — ETHANOL

## 2017-12-16 MED ORDER — IOHEXOL 300 MG/ML  SOLN
100.0000 mL | Freq: Once | INTRAMUSCULAR | Status: AC | PRN
  Administered 2017-12-16: 100 mL via INTRAVENOUS

## 2017-12-16 MED ORDER — OXYCODONE HCL 5 MG PO TABS
10.0000 mg | ORAL_TABLET | Freq: Once | ORAL | Status: AC
  Administered 2017-12-16: 10 mg via ORAL
  Filled 2017-12-16: qty 2

## 2017-12-16 MED ORDER — FENTANYL CITRATE (PF) 100 MCG/2ML IJ SOLN
50.0000 ug | Freq: Once | INTRAMUSCULAR | Status: AC
  Administered 2017-12-16: 50 ug via INTRAVENOUS
  Filled 2017-12-16: qty 2
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
## 2017-12-17 ENCOUNTER — Encounter: Payer: Self-pay | Admitting: General Surgery

## 2018-01-07 ENCOUNTER — Encounter (INDEPENDENT_AMBULATORY_CARE_PROVIDER_SITE_OTHER): Payer: Self-pay | Admitting: *Deleted

## 2018-01-09 ENCOUNTER — Other Ambulatory Visit (HOSPITAL_COMMUNITY): Payer: Self-pay

## 2018-01-15 ENCOUNTER — Ambulatory Visit (HOSPITAL_COMMUNITY): Admission: RE | Admit: 2018-01-15 | Payer: Medicaid Other | Source: Ambulatory Visit | Admitting: Internal Medicine

## 2018-01-15 ENCOUNTER — Encounter (HOSPITAL_COMMUNITY): Admission: RE | Payer: Self-pay | Source: Ambulatory Visit

## 2018-01-15 SURGERY — COLONOSCOPY
Anesthesia: Moderate Sedation

## 2018-01-22 NOTE — Patient Instructions (Signed)
[name]  01/22/2018     @PREFPERIOPPHARMACY @   Your procedure is scheduled on  01/30/2018   Report to [facility] at  69   A.M.  Call this number if you have problems the morning of surgery:  [phone]   Remember:  Do not eat or drink after midnight.  You may drink clear liquids until  (follow the instructions given to you) .  Clear liquids allowed are:                    Water, Juice (non-citric and without pulp), Carbonated beverages, Clear Tea, Black Coffee only, Plain Jell-O only, Gatorade and Plain Popsicles only    Take these medicines the morning of surgery with A SIP OF WATER lamictal, levothyroxine, lithium, oxycodone (if needed), protonix, Phenergan ( if needed). Use your inhaler before you come.    Do not wear jewelry, make-up or nail polish.  Do not wear lotions, powders, or perfumes, or deodorant.  Do not shave 48 hours prior to surgery.  Men may shave face and neck.  Do not bring valuables to the hospital.  [facility] is not responsible for any belongings or valuables.  Contacts, dentures or bridgework may not be worn into surgery.  Leave your suitcase in the car.  After surgery it may be brought to your room.  For patients admitted to the hospital, discharge time will be determined by your treatment team.  Patients discharged the day of surgery will not be allowed to drive home.   Name and phone number of your driver:   family Special instructions:  Follow the diet and prep instructions given to you by Dr Olevia Perches office.  Please read over the following fact sheets that you were given. Anesthesia Post-op Instructions and Care and Recovery After Surgery       Colonoscopy, Adult A colonoscopy is an exam to look at the large intestine. It is done to check for problems, such as:  Lumps (tumors).  Growths (polyps).  Swelling (inflammation).  Bleeding.  What happens before the procedure? Eating and drinking Follow instructions  from your doctor about eating and drinking. These instructions may include:  A few days before the procedure - follow a low-fiber diet. ? Avoid nuts. ? Avoid seeds. ? Avoid dried fruit. ? Avoid raw fruits. ? Avoid vegetables.  1-3 days before the procedure - follow a clear liquid diet. Avoid liquids that have red or purple dye. Drink only clear liquids, such as: ? Clear broth or bouillon. ? Black coffee or tea. ? Clear juice. ? Clear soft drinks or sports drinks. ? Gelatin dessert. ? Popsicles.  On the day of the procedure - do not eat or drink anything during the 2 hours before the procedure.  Bowel prep If you were prescribed an oral bowel prep:  Take it as told by your doctor. Starting the day before your procedure, you will need to drink a lot of liquid. The liquid will cause you to poop (have bowel movements) until your poop is almost clear or light green.  If your skin or butt gets irritated from diarrhea, you may: ? Wipe the area with wipes that have medicine in them, such as adult wet wipes with aloe and vitamin E. ? Put something on your skin that soothes the area, such as petroleum jelly.  If you throw up (vomit) while drinking the bowel prep, take a break for up  to 60 minutes. Then begin the bowel prep again. If you keep throwing up and you cannot take the bowel prep without throwing up, call your doctor.  General instructions  Ask your doctor about changing or stopping your normal medicines. This is important if you take diabetes medicines or blood thinners.  Plan to have someone take you home from the hospital or clinic. What happens during the procedure?  An IV tube may be put into one of your veins.  You will be given medicine to help you relax (sedative).  To reduce your risk of infection: ? Your doctors will wash their hands. ? Your anal area will be washed with soap.  You will be asked to lie on your side with your knees bent.  Your doctor will get a  long, thin, flexible tube ready. The tube will have a camera and a light on the end.  The tube will be put into your anus.  The tube will be gently put into your large intestine.  Air will be delivered into your large intestine to keep it open. You may feel some pressure or cramping.  The camera will be used to take photos.  A small tissue sample may be removed from your body to be looked at under a microscope (biopsy). If any possible problems are found, the tissue will be sent to a lab for testing.  If small growths are found, your doctor may remove them and have them checked for cancer.  The tube that was put into your anus will be slowly removed. The procedure may vary among doctors and hospitals. What happens after the procedure?  Your doctor will check on you often until the medicines you were given have worn off.  Do not drive for 24 hours after the procedure.  You may have a small amount of blood in your poop.  You may pass gas.  You may have mild cramps or bloating in your belly (abdomen).  It is up to you to get the results of your procedure. Ask your doctor, or the department performing the procedure, when your results will be ready. This information is not intended to replace advice given to you by your health care provider. Make sure you discuss any questions you have with your health care provider. Document Released: 07/13/2010 Document Revised: 04/10/2016 Document Reviewed: 08/22/2015 Elsevier Interactive Patient Education  2017 [employer]  Colonoscopy, Adult, Care After This sheet gives you information about how to care for yourself after your procedure. Your health care provider may also give you more specific instructions. If you have problems or questions, contact your health care provider. What can I expect after the procedure? After the procedure, it is common to have:  A small amount of blood in your stool for 24 hours after the procedure.  Some  gas.  Mild abdominal cramping or bloating.  Follow these instructions at home: General instructions   For the first 24 hours after the procedure: ? Do not drive or use machinery. ? Do not sign important documents. ? Do not drink alcohol. ? Do your regular daily activities at a slower pace than normal. ? Eat soft, easy-to-digest foods. ? Rest often.  Take over-the-counter or prescription medicines only as told by your health care provider.  It is up to you to get the results of your procedure. Ask your health care provider, or the department performing the procedure, when your results will be ready. Relieving cramping and bloating  Try walking around  when you have cramps or feel bloated.  Apply heat to your abdomen as told by your health care provider. Use a heat source that your health care provider recommends, such as a moist heat pack or a heating pad. ? Place a towel between your skin and the heat source. ? Leave the heat on for 20-30 minutes. ? Remove the heat if your skin turns bright red. This is especially important if you are unable to feel pain, heat, or cold. You may have a greater risk of getting burned. Eating and drinking  Drink enough fluid to keep your urine clear or pale yellow.  Resume your normal diet as instructed by your health care provider. Avoid heavy or fried foods that are hard to digest.  Avoid drinking alcohol for as long as instructed by your health care provider. Contact a health care provider if:  You have blood in your stool 2-3 days after the procedure. Get help right away if:  You have more than a small spotting of blood in your stool.  You pass large blood clots in your stool.  Your abdomen is swollen.  You have nausea or vomiting.  You have a fever.  You have increasing abdominal pain that is not relieved with medicine. This information is not intended to replace advice given to you by your health care provider. Make sure you  discuss any questions you have with your health care provider. Document Released: 01/23/2004 Document Revised: 03/04/2016 Document Reviewed: 08/22/2015 Elsevier Interactive Patient Education  2018 [place] Anesthesia is a term that refers to techniques, procedures, and medicines that help a person stay safe and comfortable during a medical procedure. Monitored anesthesia care, or sedation, is one type of anesthesia. Your anesthesia specialist may recommend sedation if you will be having a procedure that does not require you to be unconscious, such as:  Cataract surgery.  A dental procedure.  A biopsy.  A colonoscopy.  During the procedure, you may receive a medicine to help you relax (sedative). There are three levels of sedation:  Mild sedation. At this level, you may feel awake and relaxed. You will be able to follow directions.  Moderate sedation. At this level, you will be sleepy. You may not remember the procedure.  Deep sedation. At this level, you will be asleep. You will not remember the procedure.  The more medicine you are given, the deeper your level of sedation will be. Depending on how you respond to the procedure, the anesthesia specialist may change your level of sedation or the type of anesthesia to fit your needs. An anesthesia specialist will monitor you closely during the procedure. Let your health care provider know about:  Any allergies you have.  All medicines you are taking, including vitamins, herbs, eye drops, creams, and over-the-counter medicines.  Any use of steroids (by mouth or as a cream).  Any problems you or family members have had with sedatives and anesthetic medicines.  Any blood disorders you have.  Any surgeries you have had.  Any medical conditions you have, such as sleep apnea.  Whether you are pregnant or may be pregnant.  Any use of cigarettes, alcohol, or street drugs. What are the  risks? Generally, this is a safe procedure. However, problems may occur, including:  Getting too much medicine (oversedation).  Nausea.  Allergic reaction to medicines.  Trouble breathing. If this happens, a breathing tube may be used to help with breathing. It will be removed when  you are awake and breathing on your own.  Heart trouble.  Lung trouble.  Before the procedure Staying hydrated Follow instructions from your health care provider about hydration, which may include:  Up to 2 hours before the procedure - you may continue to drink clear liquids, such as water, clear fruit juice, black coffee, and plain tea.  Eating and drinking restrictions Follow instructions from your health care provider about eating and drinking, which may include:  8 hours before the procedure - stop eating heavy meals or foods such as meat, fried foods, or fatty foods.  6 hours before the procedure - stop eating light meals or foods, such as toast or cereal.  6 hours before the procedure - stop drinking milk or drinks that contain milk.  2 hours before the procedure - stop drinking clear liquids.  Medicines Ask your health care provider about:  Changing or stopping your regular medicines. This is especially important if you are taking diabetes medicines or blood thinners.  Taking medicines such as aspirin and ibuprofen. These medicines can thin your blood. Do not take these medicines before your procedure if your health care provider instructs you not to.  Tests and exams  You will have a physical exam.  You may have blood tests done to show: ? How well your kidneys and liver are working. ? How well your blood can clot.  General instructions  Plan to have someone take you home from the hospital or clinic.  If you will be going home right after the procedure, plan to have someone with you for 24 hours.  What happens during the procedure?  Your blood pressure, heart rate, breathing,  level of pain and overall condition will be monitored.  An IV tube will be inserted into one of your veins.  Your anesthesia specialist will give you medicines as needed to keep you comfortable during the procedure. This may mean changing the level of sedation.  The procedure will be performed. After the procedure  Your blood pressure, heart rate, breathing rate, and blood oxygen level will be monitored until the medicines you were given have worn off.  Do not drive for 24 hours if you received a sedative.  You may: ? Feel sleepy, clumsy, or nauseous. ? Feel forgetful about what happened after the procedure. ? Have a sore throat if you had a breathing tube during the procedure. ? Vomit. This information is not intended to replace advice given to you by your health care provider. Make sure you discuss any questions you have with your health care provider. Document Released: 03/06/2005 Document Revised: 11/17/2015 Document Reviewed: 10/01/2015 Elsevier Interactive Patient Education  2018 [place], Care After These instructions provide you with information about caring for yourself after your procedure. Your health care provider may also give you more specific instructions. Your treatment has been planned according to current medical practices, but problems sometimes occur. Call your health care provider if you have any problems or questions after your procedure. What can I expect after the procedure? After your procedure, it is common to:  Feel sleepy for several hours.  Feel clumsy and have poor balance for several hours.  Feel forgetful about what happened after the procedure.  Have poor judgment for several hours.  Feel nauseous or vomit.  Have a sore throat if you had a breathing tube during the procedure.  Follow these instructions at home: For at least 24 hours after the procedure:   Do not: ?  Participate in activities in which you could  fall or become injured. ? Drive. ? Use heavy machinery. ? Drink alcohol. ? Take sleeping pills or medicines that cause drowsiness. ? Make important decisions or sign legal documents. ? Take care of children on your own.  Rest. Eating and drinking  Follow the diet that is recommended by your health care provider.  If you vomit, drink water, juice, or soup when you can drink without vomiting.  Make sure you have little or no nausea before eating solid foods. General instructions  Have a responsible adult stay with you until you are awake and alert.  Take over-the-counter and prescription medicines only as told by your health care provider.  If you smoke, do not smoke without supervision.  Keep all follow-up visits as told by your health care provider. This is important. Contact a health care provider if:  You keep feeling nauseous or you keep vomiting.  You feel light-headed.  You develop a rash.  You have a fever. Get help right away if:  You have trouble breathing. This information is not intended to replace advice given to you by your health care provider. Make sure you discuss any questions you have with your health care provider. Document Released: 10/01/2015 Document Revised: 01/31/2016 Document Reviewed: 10/01/2015 Elsevier Interactive Patient Education  [employer].

## 2018-01-27 ENCOUNTER — Encounter (HOSPITAL_COMMUNITY)
Admission: RE | Admit: 2018-01-27 | Discharge: 2018-01-27 | Disposition: A | Discharge status: Home / Self Care | Payer: Medicaid Other | Source: Ambulatory Visit | Attending: Internal Medicine | Admitting: Internal Medicine

## 2018-01-27 ENCOUNTER — Encounter (INDEPENDENT_AMBULATORY_CARE_PROVIDER_SITE_OTHER): Payer: Self-pay | Admitting: *Deleted

## 2018-01-28 ENCOUNTER — Encounter (HOSPITAL_COMMUNITY): Payer: Medicaid Other

## 2018-02-12 NOTE — Patient Instructions (Signed)
[name]  02/12/2018     @PREFPERIOPPHARMACY @   Your procedure is scheduled on   02/20/2018 .  Report to [name] at  930   A.M.  Call this number if you have problems the morning of surgery:  [phone]   Remember:  Do not eat or drink after midnight.  You may drink clear liquids until (follow the instructions given to you) .  Clear liquids allowed are:                    Water, Juice (non-citric and without pulp), Carbonated beverages, Clear Tea, Black Coffee only, Plain Jell-O only, Gatorade and Plain Popsicles only    Take these medicines the morning of surgery with A SIP OF WATER  Lamictil, levothyroxine, lithium, oxycodone ( if needed), protonix, phenergan ( if needed). Use your dulera before you come.    Do not wear jewelry, make-up or nail polish.  Do not wear lotions, powders, or perfumes, or deodorant.  Do not shave 48 hours prior to surgery.  Men may shave face and neck.  Do not bring valuables to the hospital.  [facility] is not responsible for any belongings or valuables.  Contacts, dentures or bridgework may not be worn into surgery.  Leave your suitcase in the car.  After surgery it may be brought to your room.  For patients admitted to the hospital, discharge time will be determined by your treatment team.  Patients discharged the day of surgery will not be allowed to drive home.   Name and phone number of your driver:   family Special instructions:  Follow the diet and instructions given to you by Dr Olevia Perches office.  Please read over the following fact sheets that you were given. Anesthesia Post-op Instructions and Care and Recovery After Surgery       Colonoscopy, Adult A colonoscopy is an exam to look at the large intestine. It is done to check for problems, such as:  Lumps (tumors).  Growths (polyps).  Swelling (inflammation).  Bleeding.  What happens before the procedure? Eating and drinking Follow instructions  from your doctor about eating and drinking. These instructions may include:  A few days before the procedure - follow a low-fiber diet. ? Avoid nuts. ? Avoid seeds. ? Avoid dried fruit. ? Avoid raw fruits. ? Avoid vegetables.  1-3 days before the procedure - follow a clear liquid diet. Avoid liquids that have red or purple dye. Drink only clear liquids, such as: ? Clear broth or bouillon. ? Black coffee or tea. ? Clear juice. ? Clear soft drinks or sports drinks. ? Gelatin dessert. ? Popsicles.  On the day of the procedure - do not eat or drink anything during the 2 hours before the procedure.  Bowel prep If you were prescribed an oral bowel prep:  Take it as told by your doctor. Starting the day before your procedure, you will need to drink a lot of liquid. The liquid will cause you to poop (have bowel movements) until your poop is almost clear or light green.  If your skin or butt gets irritated from diarrhea, you may: ? Wipe the area with wipes that have medicine in them, such as adult wet wipes with aloe and vitamin E. ? Put something on your skin that soothes the area, such as petroleum jelly.  If you throw up (vomit) while drinking the bowel prep, take a break for  up to 60 minutes. Then begin the bowel prep again. If you keep throwing up and you cannot take the bowel prep without throwing up, call your doctor.  General instructions  Ask your doctor about changing or stopping your normal medicines. This is important if you take diabetes medicines or blood thinners.  Plan to have someone take you home from the hospital or clinic. What happens during the procedure?  An IV tube may be put into one of your veins.  You will be given medicine to help you relax (sedative).  To reduce your risk of infection: ? Your doctors will wash their hands. ? Your anal area will be washed with soap.  You will be asked to lie on your side with your knees bent.  Your doctor will get a  long, thin, flexible tube ready. The tube will have a camera and a light on the end.  The tube will be put into your anus.  The tube will be gently put into your large intestine.  Air will be delivered into your large intestine to keep it open. You may feel some pressure or cramping.  The camera will be used to take photos.  A small tissue sample may be removed from your body to be looked at under a microscope (biopsy). If any possible problems are found, the tissue will be sent to a lab for testing.  If small growths are found, your doctor may remove them and have them checked for cancer.  The tube that was put into your anus will be slowly removed. The procedure may vary among doctors and hospitals. What happens after the procedure?  Your doctor will check on you often until the medicines you were given have worn off.  Do not drive for 24 hours after the procedure.  You may have a small amount of blood in your poop.  You may pass gas.  You may have mild cramps or bloating in your belly (abdomen).  It is up to you to get the results of your procedure. Ask your doctor, or the department performing the procedure, when your results will be ready. This information is not intended to replace advice given to you by your health care provider. Make sure you discuss any questions you have with your health care provider. Document Released: 07/13/2010 Document Revised: 04/10/2016 Document Reviewed: 08/22/2015 Elsevier Interactive Patient Education  2017 [employer]  Colonoscopy, Adult, Care After This sheet gives you information about how to care for yourself after your procedure. Your health care provider may also give you more specific instructions. If you have problems or questions, contact your health care provider. What can I expect after the procedure? After the procedure, it is common to have:  A small amount of blood in your stool for 24 hours after the procedure.  Some  gas.  Mild abdominal cramping or bloating.  Follow these instructions at home: General instructions   For the first 24 hours after the procedure: ? Do not drive or use machinery. ? Do not sign important documents. ? Do not drink alcohol. ? Do your regular daily activities at a slower pace than normal. ? Eat soft, easy-to-digest foods. ? Rest often.  Take over-the-counter or prescription medicines only as told by your health care provider.  It is up to you to get the results of your procedure. Ask your health care provider, or the department performing the procedure, when your results will be ready. Relieving cramping and bloating  Try walking  around when you have cramps or feel bloated.  Apply heat to your abdomen as told by your health care provider. Use a heat source that your health care provider recommends, such as a moist heat pack or a heating pad. ? Place a towel between your skin and the heat source. ? Leave the heat on for 20-30 minutes. ? Remove the heat if your skin turns bright red. This is especially important if you are unable to feel pain, heat, or cold. You may have a greater risk of getting burned. Eating and drinking  Drink enough fluid to keep your urine clear or pale yellow.  Resume your normal diet as instructed by your health care provider. Avoid heavy or fried foods that are hard to digest.  Avoid drinking alcohol for as long as instructed by your health care provider. Contact a health care provider if:  You have blood in your stool 2-3 days after the procedure. Get help right away if:  You have more than a small spotting of blood in your stool.  You pass large blood clots in your stool.  Your abdomen is swollen.  You have nausea or vomiting.  You have a fever.  You have increasing abdominal pain that is not relieved with medicine. This information is not intended to replace advice given to you by your health care provider. Make sure you  discuss any questions you have with your health care provider. Document Released: 01/23/2004 Document Revised: 03/04/2016 Document Reviewed: 08/22/2015 Elsevier Interactive Patient Education  2018 [place] Anesthesia is a term that refers to techniques, procedures, and medicines that help a person stay safe and comfortable during a medical procedure. Monitored anesthesia care, or sedation, is one type of anesthesia. Your anesthesia specialist may recommend sedation if you will be having a procedure that does not require you to be unconscious, such as:  Cataract surgery.  A dental procedure.  A biopsy.  A colonoscopy.  During the procedure, you may receive a medicine to help you relax (sedative). There are three levels of sedation:  Mild sedation. At this level, you may feel awake and relaxed. You will be able to follow directions.  Moderate sedation. At this level, you will be sleepy. You may not remember the procedure.  Deep sedation. At this level, you will be asleep. You will not remember the procedure.  The more medicine you are given, the deeper your level of sedation will be. Depending on how you respond to the procedure, the anesthesia specialist may change your level of sedation or the type of anesthesia to fit your needs. An anesthesia specialist will monitor you closely during the procedure. Let your health care provider know about:  Any allergies you have.  All medicines you are taking, including vitamins, herbs, eye drops, creams, and over-the-counter medicines.  Any use of steroids (by mouth or as a cream).  Any problems you or family members have had with sedatives and anesthetic medicines.  Any blood disorders you have.  Any surgeries you have had.  Any medical conditions you have, such as sleep apnea.  Whether you are pregnant or may be pregnant.  Any use of cigarettes, alcohol, or street drugs. What are the  risks? Generally, this is a safe procedure. However, problems may occur, including:  Getting too much medicine (oversedation).  Nausea.  Allergic reaction to medicines.  Trouble breathing. If this happens, a breathing tube may be used to help with breathing. It will be removed  when you are awake and breathing on your own.  Heart trouble.  Lung trouble.  Before the procedure Staying hydrated Follow instructions from your health care provider about hydration, which may include:  Up to 2 hours before the procedure - you may continue to drink clear liquids, such as water, clear fruit juice, black coffee, and plain tea.  Eating and drinking restrictions Follow instructions from your health care provider about eating and drinking, which may include:  8 hours before the procedure - stop eating heavy meals or foods such as meat, fried foods, or fatty foods.  6 hours before the procedure - stop eating light meals or foods, such as toast or cereal.  6 hours before the procedure - stop drinking milk or drinks that contain milk.  2 hours before the procedure - stop drinking clear liquids.  Medicines Ask your health care provider about:  Changing or stopping your regular medicines. This is especially important if you are taking diabetes medicines or blood thinners.  Taking medicines such as aspirin and ibuprofen. These medicines can thin your blood. Do not take these medicines before your procedure if your health care provider instructs you not to.  Tests and exams  You will have a physical exam.  You may have blood tests done to show: ? How well your kidneys and liver are working. ? How well your blood can clot.  General instructions  Plan to have someone take you home from the hospital or clinic.  If you will be going home right after the procedure, plan to have someone with you for 24 hours.  What happens during the procedure?  Your blood pressure, heart rate, breathing,  level of pain and overall condition will be monitored.  An IV tube will be inserted into one of your veins.  Your anesthesia specialist will give you medicines as needed to keep you comfortable during the procedure. This may mean changing the level of sedation.  The procedure will be performed. After the procedure  Your blood pressure, heart rate, breathing rate, and blood oxygen level will be monitored until the medicines you were given have worn off.  Do not drive for 24 hours if you received a sedative.  You may: ? Feel sleepy, clumsy, or nauseous. ? Feel forgetful about what happened after the procedure. ? Have a sore throat if you had a breathing tube during the procedure. ? Vomit. This information is not intended to replace advice given to you by your health care provider. Make sure you discuss any questions you have with your health care provider. Document Released: 03/06/2005 Document Revised: 11/17/2015 Document Reviewed: 10/01/2015 Elsevier Interactive Patient Education  2018 [place], Care After These instructions provide you with information about caring for yourself after your procedure. Your health care provider may also give you more specific instructions. Your treatment has been planned according to current medical practices, but problems sometimes occur. Call your health care provider if you have any problems or questions after your procedure. What can I expect after the procedure? After your procedure, it is common to:  Feel sleepy for several hours.  Feel clumsy and have poor balance for several hours.  Feel forgetful about what happened after the procedure.  Have poor judgment for several hours.  Feel nauseous or vomit.  Have a sore throat if you had a breathing tube during the procedure.  Follow these instructions at home: For at least 24 hours after the procedure:   Do  not: ? Participate in activities in which you could  fall or become injured. ? Drive. ? Use heavy machinery. ? Drink alcohol. ? Take sleeping pills or medicines that cause drowsiness. ? Make important decisions or sign legal documents. ? Take care of children on your own.  Rest. Eating and drinking  Follow the diet that is recommended by your health care provider.  If you vomit, drink water, juice, or soup when you can drink without vomiting.  Make sure you have little or no nausea before eating solid foods. General instructions  Have a responsible adult stay with you until you are awake and alert.  Take over-the-counter and prescription medicines only as told by your health care provider.  If you smoke, do not smoke without supervision.  Keep all follow-up visits as told by your health care provider. This is important. Contact a health care provider if:  You keep feeling nauseous or you keep vomiting.  You feel light-headed.  You develop a rash.  You have a fever. Get help right away if:  You have trouble breathing. This information is not intended to replace advice given to you by your health care provider. Make sure you discuss any questions you have with your health care provider. Document Released: 10/01/2015 Document Revised: 01/31/2016 Document Reviewed: 10/01/2015 Elsevier Interactive Patient Education  [employer].

## 2018-02-13 ENCOUNTER — Encounter (HOSPITAL_COMMUNITY)
Admission: RE | Admit: 2018-02-13 | Discharge: 2018-02-13 | Disposition: A | Discharge status: Home / Self Care | Payer: Medicaid Other | Source: Ambulatory Visit | Attending: Internal Medicine | Admitting: Internal Medicine

## 2018-02-13 ENCOUNTER — Encounter (HOSPITAL_COMMUNITY): Payer: Self-pay

## 2018-02-13 ENCOUNTER — Other Ambulatory Visit: Payer: Self-pay

## 2018-02-13 DIAGNOSIS — Z01818 Encounter for other preprocedural examination: Secondary | ICD-10-CM | POA: Diagnosis not present

## 2018-02-13 DIAGNOSIS — Z860100 Personal history of colon polyps, unspecified: Secondary | ICD-10-CM

## 2018-02-13 HISTORY — DX: Unspecified convulsions: R56.9

## 2018-02-13 LAB — CBC
HCT: 49.3 % (ref 39.0–52.0)
Hemoglobin: 16.3 g/dL (ref 13.0–17.0)
MCH: 31.5 pg (ref 26.0–34.0)
MCHC: 33.1 g/dL (ref 30.0–36.0)
MCV: 95.2 fL (ref 78.0–100.0)
PLATELETS: 224 10*3/uL (ref 150–400)
RBC: 5.18 MIL/uL (ref 4.22–5.81)
RDW: 13.5 % (ref 11.5–15.5)
WBC: 8.7 10*3/uL (ref 4.0–10.5)

## 2018-02-13 LAB — BASIC METABOLIC PANEL
Anion gap: 9 (ref 5–15)
BUN: 10 mg/dL (ref 6–20)
CALCIUM: 10.4 mg/dL — AB (ref 8.9–10.3)
CO2: 30 mmol/L (ref 22–32)
CREATININE: 0.96 mg/dL (ref 0.61–1.24)
Chloride: 104 mmol/L (ref 98–111)
GFR calc Af Amer: >60 mL/min (ref >60)
GFR calc non Af Amer: >60 mL/min (ref >60)
Glucose, Bld: 116 mg/dL — ABNORMAL HIGH (ref 70–99)
Potassium: 4.6 mmol/L (ref 3.5–5.1)
SODIUM: 143 mmol/L (ref 135–145)

## 2018-02-20 ENCOUNTER — Ambulatory Visit (HOSPITAL_COMMUNITY): Payer: Medicaid Other | Admitting: Anesthesiology

## 2018-02-20 ENCOUNTER — Ambulatory Visit (HOSPITAL_COMMUNITY)
Admission: RE | Admit: 2018-02-20 | Discharge: 2018-02-20 | Disposition: A | Discharge status: Home / Self Care | Payer: Medicaid Other | Source: Ambulatory Visit | Attending: Internal Medicine | Admitting: Internal Medicine

## 2018-02-20 ENCOUNTER — Encounter (HOSPITAL_COMMUNITY)
Admission: RE | Disposition: A | Discharge status: Home / Self Care | Payer: Self-pay | Source: Ambulatory Visit | Attending: Internal Medicine

## 2018-02-20 DIAGNOSIS — D123 Benign neoplasm of transverse colon: Secondary | ICD-10-CM | POA: Insufficient documentation

## 2018-02-20 DIAGNOSIS — Z885 Allergy status to narcotic agent status: Secondary | ICD-10-CM | POA: Diagnosis not present

## 2018-02-20 DIAGNOSIS — G8929 Other chronic pain: Secondary | ICD-10-CM | POA: Diagnosis not present

## 2018-02-20 DIAGNOSIS — K7460 Unspecified cirrhosis of liver: Secondary | ICD-10-CM | POA: Diagnosis not present

## 2018-02-20 DIAGNOSIS — F17210 Nicotine dependence, cigarettes, uncomplicated: Secondary | ICD-10-CM | POA: Diagnosis not present

## 2018-02-20 DIAGNOSIS — N400 Enlarged prostate without lower urinary tract symptoms: Secondary | ICD-10-CM | POA: Insufficient documentation

## 2018-02-20 DIAGNOSIS — Z886 Allergy status to analgesic agent status: Secondary | ICD-10-CM | POA: Insufficient documentation

## 2018-02-20 DIAGNOSIS — K6289 Other specified diseases of anus and rectum: Secondary | ICD-10-CM | POA: Insufficient documentation

## 2018-02-20 DIAGNOSIS — Z9981 Dependence on supplemental oxygen: Secondary | ICD-10-CM | POA: Diagnosis not present

## 2018-02-20 DIAGNOSIS — Z1211 Encounter for screening for malignant neoplasm of colon: Secondary | ICD-10-CM | POA: Diagnosis present

## 2018-02-20 DIAGNOSIS — E039 Hypothyroidism, unspecified: Secondary | ICD-10-CM | POA: Diagnosis not present

## 2018-02-20 DIAGNOSIS — Z860100 Personal history of colon polyps, unspecified: Secondary | ICD-10-CM | POA: Insufficient documentation

## 2018-02-20 DIAGNOSIS — Z880 Allergy status to penicillin: Secondary | ICD-10-CM | POA: Insufficient documentation

## 2018-02-20 DIAGNOSIS — B1920 Unspecified viral hepatitis C without hepatic coma: Secondary | ICD-10-CM | POA: Diagnosis not present

## 2018-02-20 DIAGNOSIS — Z09 Encounter for follow-up examination after completed treatment for conditions other than malignant neoplasm: Secondary | ICD-10-CM | POA: Insufficient documentation

## 2018-02-20 DIAGNOSIS — F319 Bipolar disorder, unspecified: Secondary | ICD-10-CM | POA: Diagnosis not present

## 2018-02-20 DIAGNOSIS — Z96641 Presence of right artificial hip joint: Secondary | ICD-10-CM | POA: Diagnosis not present

## 2018-02-20 DIAGNOSIS — K219 Gastro-esophageal reflux disease without esophagitis: Secondary | ICD-10-CM | POA: Diagnosis not present

## 2018-02-20 DIAGNOSIS — F419 Anxiety disorder, unspecified: Secondary | ICD-10-CM | POA: Insufficient documentation

## 2018-02-20 DIAGNOSIS — Z79899 Other long term (current) drug therapy: Secondary | ICD-10-CM | POA: Insufficient documentation

## 2018-02-20 DIAGNOSIS — Z881 Allergy status to other antibiotic agents status: Secondary | ICD-10-CM | POA: Insufficient documentation

## 2018-02-20 DIAGNOSIS — J449 Chronic obstructive pulmonary disease, unspecified: Secondary | ICD-10-CM | POA: Diagnosis not present

## 2018-02-20 HISTORY — PX: POLYPECTOMY: SHX5525

## 2018-02-20 HISTORY — PX: COLONOSCOPY WITH PROPOFOL: SHX5780

## 2018-02-20 SURGERY — COLONOSCOPY WITH PROPOFOL
Anesthesia: General

## 2018-02-20 MED ORDER — PROPOFOL 10 MG/ML IV BOLUS
INTRAVENOUS | Status: DC | PRN
  Administered 2018-02-20 (×2): 10 mg via INTRAVENOUS
  Administered 2018-02-20: 20 mg via INTRAVENOUS

## 2018-02-20 MED ORDER — MIDAZOLAM HCL 2 MG/2ML IJ SOLN
INTRAMUSCULAR | Status: AC
  Filled 2018-02-20: qty 2

## 2018-02-20 MED ORDER — PROPOFOL 10 MG/ML IV BOLUS
INTRAVENOUS | Status: AC
  Filled 2018-02-20: qty 40

## 2018-02-20 MED ORDER — PROPOFOL 500 MG/50ML IV EMUL
INTRAVENOUS | Status: DC | PRN
  Administered 2018-02-20: 150 ug/kg/min via INTRAVENOUS

## 2018-02-20 MED ORDER — MIDAZOLAM HCL 5 MG/5ML IJ SOLN
INTRAMUSCULAR | Status: DC | PRN
  Administered 2018-02-20: 2 mg via INTRAVENOUS

## 2018-02-20 MED ORDER — LACTATED RINGERS IV SOLN
INTRAVENOUS | Status: DC
  Administered 2018-02-20: 11:00:00 via INTRAVENOUS

## 2018-02-20 MED ORDER — LIDOCAINE HCL (PF) 1 % IJ SOLN
INTRAMUSCULAR | Status: AC
  Filled 2018-02-20: qty 25

## 2018-02-20 MED ORDER — FENTANYL CITRATE (PF) 100 MCG/2ML IJ SOLN: 25.0000 ug | INTRAMUSCULAR | Status: DC | PRN

## 2018-02-20 NOTE — Anesthesia Preprocedure Evaluation (Signed)
Anesthesia Evaluation  Patient identified by MRN, date of birth, ID band Patient awake    Reviewed: Allergy & Precautions, NPO status , Patient's Chart, lab work & pertinent test results  Airway Mallampati: I  TM Distance: >3 FB Neck ROM: Full    Dental no notable dental hx. (+) Upper Dentures, Edentulous Lower   Pulmonary neg pulmonary ROS, shortness of breath, COPD,  COPD inhaler and oxygen dependent, Current Smoker,    Pulmonary exam normal breath sounds clear to auscultation       Cardiovascular Exercise Tolerance: Good negative cardio ROS Normal cardiovascular examI Rhythm:Regular Rate:Normal     Neuro/Psych Seizures -, Well Controlled,  PSYCHIATRIC DISORDERS Anxiety Depression Bipolar Disorder GM x1 remotely -denies Sz meds or recent Sz activity  negative neurological ROS  negative psych ROS   GI/Hepatic negative GI ROS, Neg liver ROS, GERD  Medicated and Controlled,(+) Hepatitis -, CHep C Tx'd per pt  Still uses EtoH   Endo/Other  negative endocrine ROSHypothyroidism   Renal/GU negative Renal ROS  negative genitourinary   Musculoskeletal negative musculoskeletal ROS (+)   Abdominal   Peds negative pediatric ROS (+)  Hematology negative hematology ROS (+) anemia ,   Anesthesia Other Findings   Reproductive/Obstetrics negative OB ROS                             Anesthesia Physical Anesthesia Plan  ASA: III  Anesthesia Plan: General   Post-op Pain Management:    Induction: Intravenous  PONV Risk Score and Plan:   Airway Management Planned: Nasal Cannula and Simple Face Mask  Additional Equipment:   Intra-op Plan:   Post-operative Plan:   Informed Consent: I have reviewed the patients History and Physical, chart, labs and discussed the procedure including the risks, benefits and alternatives for the proposed anesthesia with the patient or authorized representative who  has indicated his/her understanding and acceptance.   Dental advisory given  Plan Discussed with: CRNA  Anesthesia Plan Comments:         Anesthesia Quick Evaluation

## 2018-02-20 NOTE — Op Note (Signed)
[facility] Patient Name: [name] Procedure Date: 02/20/2018 10:36 AM MRN: [id number] Date of Birth: 08-25-1960 Attending MD: Hildred Laser , MD CSN: [id number] Age: 57 Admit Type: Outpatient Procedure:                Colonoscopy Indications:              High risk colon cancer surveillance: Personal                            history of colonic polyps Providers:                Hildred Laser, MD, Otis Peak B. Sharon Seller, RN, Nelma Rothman, Technician Referring MD:             Jasper Loser. Luan Pulling, MD Medicines:                Propofol per Anesthesia Complications:            No immediate complications. Estimated Blood Loss:     Estimated blood loss was minimal. Procedure:                Pre-Anesthesia Assessment:                           - Prior to the procedure, a History and Physical                            was performed, and patient medications and                            allergies were reviewed. The patient's tolerance of                            previous anesthesia was also reviewed. The risks                            and benefits of the procedure and the sedation                            options and risks were discussed with the patient.                            All questions were answered, and informed consent                            was obtained. Prior Anticoagulants: The patient has                            taken no previous anticoagulant or antiplatelet                            agents. ASA Grade Assessment: III - A patient with  severe systemic disease. After reviewing the risks                            and benefits, the patient was deemed in                            satisfactory condition to undergo the procedure.                           After obtaining informed consent, the colonoscope                            was passed under direct vision. Throughout the                             procedure, the patient's blood pressure, pulse, and                            oxygen saturations were monitored continuously. The                            [id number]-[id number] ([id number]) scope was introduced through                            the and advanced to the the cecum, identified by                            appendiceal orifice and ileocecal valve. The                            colonoscopy was technically difficult and complex                            due to inadequate bowel prep. Successful completion                            of the procedure was aided by lavage. The patient                            tolerated the procedure well. The quality of the                            bowel preparation was inadequate. The ileocecal                            valve, appendiceal orifice, and rectum were                            photographed. Scope In: 11:03:32 AM Scope Out: 11:31:56 AM Scope Withdrawal Time: 0 hours 9 minutes 27 seconds  Total Procedure Duration: 0 hours 28 minutes 24 seconds  Findings:      The perianal and digital rectal examinations were normal.      Three sessile polyps were found in the splenic flexure and transverse  colon. The polyps were 4 to 7 mm in size. These polyps were removed with       a cold snare. Resection and retrieval were complete. The pathology       specimen was placed into Bottle Number 1.      The exam was otherwise normal throughout the examined colon.      Anal papilla(e) were hypertrophied. Impression:               - Preparation of the colon was inadequate.                           - Three 4 to 7 mm polyps at the splenic flexure and                            in the transverse colon, removed with a cold snare.                            Resected and retrieved.                           - Anal papilla(e) were hypertrophied. Moderate Sedation:      Per Anesthesia Care Recommendation:           - Patient has a contact number available for                             emergencies. The signs and symptoms of potential                            delayed complications were discussed with the                            patient. Return to normal activities tomorrow.                            Written discharge instructions were provided to the                            patient.                           - Resume previous diet today.                           - Continue present medications.                           - No aspirin, ibuprofen, naproxen, or other                            non-steroidal anti-inflammatory drugs for 1 day.                           - Await pathology results.                           -  Repeat colonoscopy in 5 years for surveillance. Procedure Code(s):        --- Professional ---                           [phone], Colonoscopy, flexible; with removal of                            tumor(s), polyp(s), or other lesion(s) by snare                            technique Diagnosis Code(s):        --- Professional ---                           Z86.010, Personal history of colonic polyps                           D12.3, Benign neoplasm of transverse colon (hepatic                            flexure or splenic flexure)                           K62.89, Other specified diseases of anus and rectum CPT copyright 2017 [employer]. All rights reserved. The codes documented in this report are preliminary and upon coder review may  be revised to meet current compliance requirements. Hildred Laser, MD Hildred Laser, MD 02/20/2018 11:41:27 AM This report has been signed electronically. Number of Addenda: 0

## 2018-02-20 NOTE — H&P (Signed)
[name] is an 57 y.o. male.   Chief Complaint: Patient is here for colonoscopy. HPI: Patient is 57 year old Caucasian male who has history of colonic adenomas and is here for surveillance colonoscopy.  He denies abdominal pain change in bowel habits or rectal bleeding.  He had 2 colonic adenomas removed on his colonoscopy 10 years ago.  None was found on his last exam of 2014. Patient has a history of upper GI bleed secondary to duodenal ulcer in September last year.  It was secondary to NSAID.  He is not taking NSAIDs anymore.  He is on pain medication for chronic pain. His father died of unknown malignancy.  Past Medical History:  Diagnosis Date  . Anxiety   . Bipolar disorder ([place])   . BPH (benign prostatic hyperplasia)   . Cirrhosis ([place])   . COPD (chronic obstructive pulmonary disease) ([place])   . Depression   . Epicondylitis    hips  . ETOH abuse   . GERD (gastroesophageal reflux disease)   . Head injury   . Hepatitis C   . Hypothyroidism   . Oxygen decrease   . Pancreatitis chronic   . Seizure ([place])    had 1 grand mal seizure, no more, unknown etiology and no more seizures.  . Shortness of breath     Past Surgical History:  Procedure Laterality Date  . APPENDECTOMY  age 71  . BIOPSY  03/22/2017   Procedure: BIOPSY;  Surgeon: Danie Binder, MD;  [facility];  Service: Endoscopy;;  . COLONOSCOPY  07/22/2012   Procedure: COLONOSCOPY;  Surgeon: Rogene [place], MD;  [facility];  Service: Endoscopy;  Laterality: N/A;  225-moved to 200 Ann notified pt  . ESOPHAGOGASTRODUODENOSCOPY N/A 03/22/2017   Procedure: ESOPHAGOGASTRODUODENOSCOPY (EGD);  Surgeon: Danie Binder, MD;  [facility];  Service: Endoscopy;  Laterality: N/A;  . FASCIOTOMY Left 08/30/2012   Procedure: FASCIOTOMY;  Surgeon: Johnn Hai, MD;  Location: [facility];  Service: Orthopedics;  Laterality: Left;  . FEMUR FRACTURE SURGERY     otif  . HERNIA REPAIR Right   . I&D  EXTREMITY Left 09/03/2012   Procedure: IRRIGATION AND DEBRIDEMENT EXTREMITY;  Surgeon: Johnny Bridge, MD;  Location: [place];  Service: Orthopedics;  Laterality: Left;  . INGUINAL HERNIA REPAIR Right 11/24/2017   Procedure: HERNIA REPAIR INGUINAL ADULT WITH MESH;  Surgeon: Aviva Signs, MD;  Location: [facility];  Service: General;  Laterality: Right;  . SKIN SPLIT GRAFT Left 09/08/2012   Procedure:  SPLIT THICKNESS SKIN GRAFT LEFT LEG ;  Surgeon: Rozanna Box, MD;  Location: [place];  Service: Orthopedics;  Laterality: Left;  . TOTAL HIP ARTHROPLASTY  1 year ago   right hip-[place]  . TOTAL HIP REVISION Right 10/25/2016   Procedure: Revision right hip constrained liner;  Surgeon: Gaynelle Arabian, MD;  Location: [facility];  Service: Orthopedics;  Laterality: Right;  . TRANSURETHRAL RESECTION OF PROSTATE  05/23/2011   Procedure: TRANSURETHRAL RESECTION OF THE PROSTATE (TURP);  Surgeon: Marissa Nestle;  Location: [facility];  Service: Urology;  Laterality: N/A;  placement of suprapubic catheter    Family History  Problem Relation Age of Onset  . Anesthesia problems Neg Hx   . Hypotension Neg Hx   . Pseudochol deficiency Neg Hx   . Malignant hyperthermia Neg Hx    Social History:  reports that he has been smoking cigarettes. He has a 15.00 pack-year smoking history. He has  never used smokeless tobacco. He reports that he drinks about 14.0 standard drinks of alcohol per week. He reports that he has current or past drug history. Drug: Marijuana.  Allergies:  Allergies  Allergen Reactions  . Penicillins Anaphylaxis    Has patient had a PCN reaction causing immediate rash, facial/tongue/throat swelling, SOB or lightheadedness with hypotension: Yes Has patient had a PCN reaction causing severe rash involving mucus membranes or skin necrosis:No Has patient had a PCN reaction that required hospitalization:No Has patient had a PCN reaction occurring within the last 10 years: No If all of the above answers  are "NO", then may proceed with Cephalosporin use.   . Vancomycin     redman syndrome  . Codeine Hives  . Penicillins Hives    Has patient had a PCN reaction causing immediate rash, facial/tongue/throat swelling, SOB or lightheadedness with hypotension: Yes Has patient had a PCN reaction causing severe rash involving mucus membranes or skin necrosis: No Has patient had a PCN reaction that required hospitalization: No Has patient had a PCN reaction occurring within the last 10 years: No If all of the above answers are "NO", then may proceed with Cephalosporin use.   . Tylenol [Acetaminophen] Other (See Comments)    Patient has hepatitis    Medications Prior to Admission  Medication Sig Dispense Refill  . lamoTRIgine (LAMICTAL) 100 MG tablet Take 1 tablet (100 mg total) by mouth 2 (two) times daily. 60 tablet 0  . levothyroxine (SYNTHROID, LEVOTHROID) 88 MCG tablet Take 1 tablet (88 mcg total) by mouth daily. 30 tablet 0  . lithium carbonate 300 MG capsule Take 1 capsule (300 mg total) by mouth 2 (two) times daily with a meal. 60 capsule 0  . mometasone-formoterol (DULERA) 200-5 MCG/ACT AERO Inhale 2 puffs into the lungs 3 (three) times daily.     Marland Kitchen oxyCODONE (OXY IR/ROXICODONE) 5 MG immediate release tablet Take 10 mg by mouth every 6 (six) hours as needed. for pain  0  . OXYGEN Inhale 2.5 L into the lungs daily as needed (for breathing).     . pantoprazole (PROTONIX) 40 MG tablet Take 1 tablet (40 mg total) by mouth 2 (two) times daily before a meal. 60 tablet 3  . BUTRANS 20 MCG/HR PTWK patch Place 20 mcg onto the skin every Thursday.  0  . NARCAN 4 MG/0.1ML LIQD nasal spray kit Place 4 mg into the nose See admin instructions. Administer a single spray of narcan in one nostril, repeat every 3 minutes as needed if no or minimal response  5  . oxyCODONE-acetaminophen (PERCOCET) 7.5-325 MG tablet Take 1 tablet by mouth every 6 (six) hours as needed for severe pain. (Patient not taking:  Reported on 01/21/2018) 25 tablet 0  . promethazine (PHENERGAN) 25 MG tablet Take 25 mg by mouth 4 (four) times daily as needed for nausea/vomiting.  5    No results found for this or any previous visit (from the past 48 hour(s)). No results found.  ROS  Blood pressure (!) 165/96, pulse 89, temperature 97.8 F (36.6 C), temperature source Oral, resp. rate 18, SpO2 97 %. Physical Exam  Constitutional:  Well-developed thin Caucasian male in NAD.  HENT:  Mouth/Throat: Oropharynx is clear and moist.  Eyes: Conjunctivae are normal.  Neck: No thyromegaly present.  Cardiovascular: Normal rate and normal heart sounds.  No murmur heard. Respiratory: Effort normal and breath sounds normal.  Pectum excavatum  GI:  Abdomen is symmetrical.  He has scar in the  right inguinal area with with firmness and mild tenderness.  Abdomen is otherwise soft and nontender with organomegaly or masses.  Musculoskeletal:  Trace edema around ankles. Changes of stasis dermatitis/pigmentation to both legs.  Lymphadenopathy:    He has no cervical adenopathy.  Neurological: He is alert.  Skin: Skin is warm and dry.     Assessment/Plan History of colonic adenomas. Surveillance colonoscopy.  Hildred Laser, MD 02/20/2018, 10:41 AM

## 2018-02-20 NOTE — Transfer of Care (Signed)
Immediate Anesthesia Transfer of Care Note  Patient: [name]  Procedure(s) Performed: COLONOSCOPY WITH PROPOFOL (N/A ) POLYPECTOMY  Patient Location: PACU  Anesthesia Type:General  Level of Consciousness: awake, alert  and patient cooperative  Airway & Oxygen Therapy: Patient Spontanous Breathing  Post-op Assessment: Report given to RN and Post -op Vital signs reviewed and stable  Post vital signs: Reviewed and stable  Last Vitals:  Vitals Value Taken Time  BP    Temp    Pulse 154 02/20/2018 11:38 AM  Resp    SpO2 84 % 02/20/2018 11:38 AM  Vitals shown include unvalidated device data.  Last Pain:  Vitals:   02/20/18 1057  TempSrc:   PainSc: 0-No pain      Patients Stated Pain Goal: 6 (24/26/83 4196)  Complications: No apparent anesthesia complications

## 2018-02-20 NOTE — Discharge Instructions (Signed)
No aspirin or NSAIDs for 24 hours. Resume usual medications and diet as before. No driving for 24 hours. Physician will call with biopsy results.  PATIENT INSTRUCTIONS POST-ANESTHESIA  IMMEDIATELY FOLLOWING SURGERY:  Do not drive or operate machinery for the first twenty four hours after surgery.  Do not make any important decisions for twenty four hours after surgery or while taking narcotic pain medications or sedatives.  If you develop intractable nausea and vomiting or a severe headache please notify your doctor immediately.  FOLLOW-UP:  Please make an appointment with your surgeon as instructed. You do not need to follow up with anesthesia unless specifically instructed to do so.  WOUND CARE INSTRUCTIONS (if applicable):  Keep a dry clean dressing on the anesthesia/puncture wound site if there is drainage.  Once the wound has quit draining you may leave it open to air.  Generally you should leave the bandage intact for twenty four hours unless there is drainage.  If the epidural site drains for more than 36-48 hours please call the anesthesia department.  QUESTIONS?:  Please feel free to call your physician or the hospital operator if you have any questions, and they will be happy to assist you.       Colonoscopy, Adult, Care After This sheet gives you information about how to care for yourself after your procedure. Your health care provider may also give you more specific instructions. If you have problems or questions, contact your health care provider. What can I expect after the procedure? After the procedure, it is common to have:  A small amount of blood in your stool for 24 hours after the procedure.  Some gas.  Mild abdominal cramping or bloating.  Follow these instructions at home: General instructions   For the first 24 hours after the procedure: ? Do not drive or use machinery. ? Do not sign important documents. ? Do not drink alcohol. ? Do your regular daily  activities at a slower pace than normal. ? Eat soft, easy-to-digest foods. ? Rest often.  Take over-the-counter or prescription medicines only as told by your health care provider.  It is up to you to get the results of your procedure. Ask your health care provider, or the department performing the procedure, when your results will be ready. Relieving cramping and bloating  Try walking around when you have cramps or feel bloated.  Apply heat to your abdomen as told by your health care provider. Use a heat source that your health care provider recommends, such as a moist heat pack or a heating pad. ? Place a towel between your skin and the heat source. ? Leave the heat on for 20-30 minutes. ? Remove the heat if your skin turns bright red. This is especially important if you are unable to feel pain, heat, or cold. You may have a greater risk of getting burned. Eating and drinking  Drink enough fluid to keep your urine clear or pale yellow.  Resume your normal diet as instructed by your health care provider. Avoid heavy or fried foods that are hard to digest.  Avoid drinking alcohol for as long as instructed by your health care provider. Contact a health care provider if:  You have blood in your stool 2-3 days after the procedure. Get help right away if:  You have more than a small spotting of blood in your stool.  You pass large blood clots in your stool.  Your abdomen is swollen.  You have nausea or vomiting.  You have a fever.  You have increasing abdominal pain that is not relieved with medicine. This information is not intended to replace advice given to you by your health care provider. Make sure you discuss any questions you have with your health care provider. Document Released: 01/23/2004 Document Revised: 03/04/2016 Document Reviewed: 08/22/2015 Elsevier Interactive Patient Education  [employer].

## 2018-02-20 NOTE — Anesthesia Postprocedure Evaluation (Signed)
Anesthesia Post Note  Patient: [name]  Procedure(s) Performed: COLONOSCOPY WITH PROPOFOL (N/A ) POLYPECTOMY  Patient location during evaluation: PACU Anesthesia Type: General Level of consciousness: awake and alert and patient cooperative Pain management: satisfactory to patient Vital Signs Assessment: post-procedure vital signs reviewed and stable Respiratory status: spontaneous breathing Cardiovascular status: stable Postop Assessment: no apparent nausea or vomiting Anesthetic complications: no     Last Vitals:  Vitals:   02/20/18 1002 02/20/18 1140  BP: (!) 165/96 (!) 118/102  Pulse: 89 (!) 25  Resp: 18 13  Temp: 36.6 C 36.5 C  SpO2: 97% 97%    Last Pain:  Vitals:   02/20/18 1140  TempSrc:   PainSc: 0-No pain                 Miley Blanchett

## 2018-02-20 NOTE — Anesthesia Procedure Notes (Signed)
Procedure Name: MAC Date/Time: 02/20/2018 10:55 AM Performed by: Vista Deck, CRNA Pre-anesthesia Checklist: Patient identified, Emergency Drugs available, Suction available, Timeout performed and Patient being monitored Patient Re-evaluated:Patient Re-evaluated prior to induction Oxygen Delivery Method: Nasal Cannula

## 2018-02-25 ENCOUNTER — Encounter (HOSPITAL_COMMUNITY): Payer: Self-pay | Admitting: Internal Medicine

## 2018-04-10 ENCOUNTER — Other Ambulatory Visit (INDEPENDENT_AMBULATORY_CARE_PROVIDER_SITE_OTHER): Payer: Self-pay | Admitting: Internal Medicine

## 2018-08-26 ENCOUNTER — Other Ambulatory Visit (INDEPENDENT_AMBULATORY_CARE_PROVIDER_SITE_OTHER): Payer: Self-pay | Admitting: Internal Medicine

## 2018-10-08 ENCOUNTER — Emergency Department (HOSPITAL_COMMUNITY): Payer: Medicaid Other

## 2018-10-08 ENCOUNTER — Inpatient Hospital Stay (HOSPITAL_COMMUNITY)
Admission: EM | Admit: 2018-10-08 | Discharge: 2018-10-16 | DRG: 640 | Disposition: A | Discharge status: Home / Self Care | Payer: Medicaid Other | Attending: Pulmonary Disease | Admitting: Pulmonary Disease

## 2018-10-08 ENCOUNTER — Other Ambulatory Visit: Payer: Self-pay

## 2018-10-08 ENCOUNTER — Encounter (HOSPITAL_COMMUNITY): Payer: Self-pay | Admitting: Emergency Medicine

## 2018-10-08 DIAGNOSIS — F17210 Nicotine dependence, cigarettes, uncomplicated: Secondary | ICD-10-CM | POA: Diagnosis present

## 2018-10-08 DIAGNOSIS — G8929 Other chronic pain: Secondary | ICD-10-CM | POA: Diagnosis present

## 2018-10-08 DIAGNOSIS — R9431 Abnormal electrocardiogram [ECG] [EKG]: Secondary | ICD-10-CM | POA: Diagnosis present

## 2018-10-08 DIAGNOSIS — K7030 Alcoholic cirrhosis of liver without ascites: Secondary | ICD-10-CM

## 2018-10-08 DIAGNOSIS — K7460 Unspecified cirrhosis of liver: Secondary | ICD-10-CM | POA: Diagnosis present

## 2018-10-08 DIAGNOSIS — B1920 Unspecified viral hepatitis C without hepatic coma: Secondary | ICD-10-CM | POA: Diagnosis present

## 2018-10-08 DIAGNOSIS — R079 Chest pain, unspecified: Secondary | ICD-10-CM

## 2018-10-08 DIAGNOSIS — E876 Hypokalemia: Secondary | ICD-10-CM | POA: Diagnosis present

## 2018-10-08 DIAGNOSIS — E039 Hypothyroidism, unspecified: Secondary | ICD-10-CM | POA: Diagnosis present

## 2018-10-08 DIAGNOSIS — W010XXA Fall on same level from slipping, tripping and stumbling without subsequent striking against object, initial encounter: Secondary | ICD-10-CM | POA: Diagnosis present

## 2018-10-08 DIAGNOSIS — Z79890 Hormone replacement therapy (postmenopausal): Secondary | ICD-10-CM

## 2018-10-08 DIAGNOSIS — F1120 Opioid dependence, uncomplicated: Secondary | ICD-10-CM | POA: Diagnosis present

## 2018-10-08 DIAGNOSIS — J449 Chronic obstructive pulmonary disease, unspecified: Secondary | ICD-10-CM | POA: Diagnosis present

## 2018-10-08 DIAGNOSIS — F10239 Alcohol dependence with withdrawal, unspecified: Secondary | ICD-10-CM | POA: Diagnosis present

## 2018-10-08 DIAGNOSIS — Z885 Allergy status to narcotic agent status: Secondary | ICD-10-CM

## 2018-10-08 DIAGNOSIS — K7010 Alcoholic hepatitis without ascites: Secondary | ICD-10-CM | POA: Diagnosis present

## 2018-10-08 DIAGNOSIS — K861 Other chronic pancreatitis: Secondary | ICD-10-CM | POA: Diagnosis present

## 2018-10-08 DIAGNOSIS — R338 Other retention of urine: Secondary | ICD-10-CM | POA: Diagnosis present

## 2018-10-08 DIAGNOSIS — F319 Bipolar disorder, unspecified: Secondary | ICD-10-CM | POA: Diagnosis present

## 2018-10-08 DIAGNOSIS — Z79899 Other long term (current) drug therapy: Secondary | ICD-10-CM

## 2018-10-08 DIAGNOSIS — Z9079 Acquired absence of other genital organ(s): Secondary | ICD-10-CM

## 2018-10-08 DIAGNOSIS — E871 Hypo-osmolality and hyponatremia: Principal | ICD-10-CM | POA: Diagnosis present

## 2018-10-08 DIAGNOSIS — Z886 Allergy status to analgesic agent status: Secondary | ICD-10-CM

## 2018-10-08 DIAGNOSIS — F419 Anxiety disorder, unspecified: Secondary | ICD-10-CM | POA: Diagnosis present

## 2018-10-08 DIAGNOSIS — J9621 Acute and chronic respiratory failure with hypoxia: Secondary | ICD-10-CM | POA: Diagnosis present

## 2018-10-08 DIAGNOSIS — Z9981 Dependence on supplemental oxygen: Secondary | ICD-10-CM | POA: Diagnosis not present

## 2018-10-08 DIAGNOSIS — Z96641 Presence of right artificial hip joint: Secondary | ICD-10-CM | POA: Diagnosis present

## 2018-10-08 DIAGNOSIS — B182 Chronic viral hepatitis C: Secondary | ICD-10-CM | POA: Diagnosis not present

## 2018-10-08 DIAGNOSIS — Z880 Allergy status to penicillin: Secondary | ICD-10-CM

## 2018-10-08 DIAGNOSIS — N401 Enlarged prostate with lower urinary tract symptoms: Secondary | ICD-10-CM | POA: Diagnosis present

## 2018-10-08 DIAGNOSIS — W2209XA Striking against other stationary object, initial encounter: Secondary | ICD-10-CM | POA: Diagnosis present

## 2018-10-08 DIAGNOSIS — W19XXXA Unspecified fall, initial encounter: Secondary | ICD-10-CM | POA: Diagnosis present

## 2018-10-08 DIAGNOSIS — Z20828 Contact with and (suspected) exposure to other viral communicable diseases: Secondary | ICD-10-CM | POA: Diagnosis present

## 2018-10-08 DIAGNOSIS — S0101XA Laceration without foreign body of scalp, initial encounter: Secondary | ICD-10-CM | POA: Diagnosis present

## 2018-10-08 DIAGNOSIS — Z881 Allergy status to other antibiotic agents status: Secondary | ICD-10-CM

## 2018-10-08 DIAGNOSIS — K219 Gastro-esophageal reflux disease without esophagitis: Secondary | ICD-10-CM | POA: Diagnosis present

## 2018-10-08 DIAGNOSIS — J438 Other emphysema: Secondary | ICD-10-CM

## 2018-10-08 DIAGNOSIS — F1011 Alcohol abuse, in remission: Secondary | ICD-10-CM | POA: Diagnosis present

## 2018-10-08 LAB — BASIC METABOLIC PANEL
Anion gap: 12 (ref 5–15)
BUN: 12 mg/dL (ref 6–20)
CO2: 28 mmol/L (ref 22–32)
Calcium: 8.9 mg/dL (ref 8.9–10.3)
Chloride: 73 mmol/L — ABNORMAL LOW (ref 98–111)
Creatinine, Ser: 0.57 mg/dL — ABNORMAL LOW (ref 0.61–1.24)
GFR calc Af Amer: >60 mL/min (ref >60)
GFR calc non Af Amer: >60 mL/min (ref >60)
Glucose, Bld: 113 mg/dL — ABNORMAL HIGH (ref 70–99)
Potassium: 3.4 mmol/L — ABNORMAL LOW (ref 3.5–5.1)
Sodium: 113 mmol/L — CL (ref 135–145)

## 2018-10-08 LAB — MRSA PCR SCREENING: MRSA by PCR: NEGATIVE

## 2018-10-08 LAB — CBC WITH DIFFERENTIAL/PLATELET
Abs Immature Granulocytes: 0.07 10*3/uL (ref 0.00–0.07)
Basophils Absolute: 0 10*3/uL (ref 0.0–0.1)
Basophils Relative: 0 %
Eosinophils Absolute: 0 10*3/uL (ref 0.0–0.5)
Eosinophils Relative: 0 %
HCT: 32.3 % — ABNORMAL LOW (ref 39.0–52.0)
Hemoglobin: 12 g/dL — ABNORMAL LOW (ref 13.0–17.0)
Immature Granulocytes: 1 %
Lymphocytes Relative: 4 %
Lymphs Abs: 0.4 10*3/uL — ABNORMAL LOW (ref 0.7–4.0)
MCH: 32.8 pg (ref 26.0–34.0)
MCHC: 37.2 g/dL — ABNORMAL HIGH (ref 30.0–36.0)
MCV: 88.3 fL (ref 80.0–100.0)
Monocytes Absolute: 0.8 10*3/uL (ref 0.1–1.0)
Monocytes Relative: 7 %
Neutro Abs: 9.7 10*3/uL — ABNORMAL HIGH (ref 1.7–7.7)
Neutrophils Relative %: 88 %
Platelets: 183 10*3/uL (ref 150–400)
RBC: 3.66 MIL/uL — ABNORMAL LOW (ref 4.22–5.81)
RDW: 11.3 % — ABNORMAL LOW (ref 11.5–15.5)
WBC: 11 10*3/uL — ABNORMAL HIGH (ref 4.0–10.5)
nRBC: 0 % (ref 0.0–0.2)

## 2018-10-08 LAB — URINALYSIS, ROUTINE W REFLEX MICROSCOPIC
Bilirubin Urine: NEGATIVE
Glucose, UA: NEGATIVE mg/dL
Hgb urine dipstick: NEGATIVE
Ketones, ur: NEGATIVE mg/dL
Leukocytes,Ua: NEGATIVE
Nitrite: NEGATIVE
Protein, ur: NEGATIVE mg/dL
Specific Gravity, Urine: 1.005 (ref 1.005–1.030)
pH: 7 (ref 5.0–8.0)

## 2018-10-08 LAB — LITHIUM LEVEL: Lithium Lvl: <0.06 mmol/L — ABNORMAL LOW (ref 0.60–1.20)

## 2018-10-08 LAB — OSMOLALITY, URINE: Osmolality, Ur: 173 mOsm/kg — ABNORMAL LOW (ref 300–900)

## 2018-10-08 LAB — RAPID URINE DRUG SCREEN, HOSP PERFORMED
Amphetamines: NOT DETECTED
Barbiturates: NOT DETECTED
Benzodiazepines: NOT DETECTED
Cocaine: NOT DETECTED
Opiates: NOT DETECTED
Tetrahydrocannabinol: NOT DETECTED

## 2018-10-08 LAB — SODIUM, URINE, RANDOM: Sodium, Ur: <10 mmol/L

## 2018-10-08 LAB — TSH: TSH: 9.001 u[IU]/mL — ABNORMAL HIGH (ref 0.350–4.500)

## 2018-10-08 LAB — SODIUM
Sodium: 115 mmol/L — CL (ref 135–145)
Sodium: 117 mmol/L — CL (ref 135–145)
Sodium: 122 mmol/L — ABNORMAL LOW (ref 135–145)

## 2018-10-08 LAB — OSMOLALITY: Osmolality: 236 mOsm/kg — CL (ref 275–295)

## 2018-10-08 LAB — ETHANOL: Alcohol, Ethyl (B): <10 mg/dL (ref <10)

## 2018-10-08 MED ORDER — BUPRENORPHINE HCL-NALOXONE HCL 8-2 MG SL SUBL: 1.0000 | SUBLINGUAL_TABLET | Freq: Two times a day (BID) | SUBLINGUAL | Status: DC

## 2018-10-08 MED ORDER — ONDANSETRON HCL 4 MG PO TABS: 4.0000 mg | ORAL_TABLET | Freq: Four times a day (QID) | ORAL | Status: DC | PRN

## 2018-10-08 MED ORDER — ACETAMINOPHEN 325 MG PO TABS
650.0000 mg | ORAL_TABLET | Freq: Four times a day (QID) | ORAL | Status: DC | PRN
  Administered 2018-10-11 – 2018-10-12 (×2): 650 mg via ORAL
  Filled 2018-10-08 (×2): qty 2

## 2018-10-08 MED ORDER — LAMOTRIGINE 100 MG PO TABS
100.0000 mg | ORAL_TABLET | Freq: Two times a day (BID) | ORAL | Status: DC
  Administered 2018-10-10 – 2018-10-16 (×13): 100 mg via ORAL
  Filled 2018-10-08 (×13): qty 1

## 2018-10-08 MED ORDER — ENOXAPARIN SODIUM 40 MG/0.4ML ~~LOC~~ SOLN
40.0000 mg | SUBCUTANEOUS | Status: DC
  Administered 2018-10-08 – 2018-10-14 (×7): 40 mg via SUBCUTANEOUS
  Filled 2018-10-08 (×9): qty 0.4

## 2018-10-08 MED ORDER — SODIUM CHLORIDE 3 % IV SOLN: INTRAVENOUS | Status: DC

## 2018-10-08 MED ORDER — IBUPROFEN 800 MG PO TABS: 800.0000 mg | ORAL_TABLET | Freq: Three times a day (TID) | ORAL | 0 refills | Status: DC

## 2018-10-08 MED ORDER — LEVOTHYROXINE SODIUM 88 MCG PO TABS
88.0000 ug | ORAL_TABLET | Freq: Every day | ORAL | Status: DC
  Administered 2018-10-10 – 2018-10-16 (×7): 88 ug via ORAL
  Filled 2018-10-08 (×7): qty 1

## 2018-10-08 MED ORDER — PANTOPRAZOLE SODIUM 40 MG PO TBEC
40.0000 mg | DELAYED_RELEASE_TABLET | Freq: Two times a day (BID) | ORAL | Status: DC
  Administered 2018-10-09 – 2018-10-16 (×15): 40 mg via ORAL
  Filled 2018-10-08 (×15): qty 1

## 2018-10-08 MED ORDER — LITHIUM CARBONATE 300 MG PO CAPS
300.0000 mg | ORAL_CAPSULE | Freq: Two times a day (BID) | ORAL | Status: DC
  Filled 2018-10-08 (×2): qty 1

## 2018-10-08 MED ORDER — ACETAMINOPHEN 650 MG RE SUPP: 650.0000 mg | Freq: Four times a day (QID) | RECTAL | Status: DC | PRN

## 2018-10-08 MED ORDER — LIDOCAINE HCL (PF) 2 % IJ SOLN
INTRAMUSCULAR | Status: AC
  Administered 2018-10-08: 5 mL via INTRADERMAL
  Filled 2018-10-08: qty 10

## 2018-10-08 MED ORDER — SERTRALINE HCL 50 MG PO TABS: 100.0000 mg | ORAL_TABLET | Freq: Every day | ORAL | Status: DC

## 2018-10-08 MED ORDER — TETANUS-DIPHTH-ACELL PERTUSSIS 5-2.5-18.5 LF-MCG/0.5 IM SUSP
0.5000 mL | Freq: Once | INTRAMUSCULAR | Status: AC
  Administered 2018-10-08: 0.5 mL via INTRAMUSCULAR
  Filled 2018-10-08: qty 0.5

## 2018-10-08 MED ORDER — ONDANSETRON HCL 4 MG/2ML IJ SOLN: 4.0000 mg | Freq: Four times a day (QID) | INTRAMUSCULAR | Status: DC | PRN

## 2018-10-08 MED ORDER — LIDOCAINE HCL (PF) 2 % IJ SOLN
5.0000 mL | Freq: Once | INTRAMUSCULAR | Status: AC
  Administered 2018-10-08: 5 mL via INTRADERMAL

## 2018-10-08 MED ORDER — POTASSIUM CHLORIDE IN NACL 20-0.9 MEQ/L-% IV SOLN
INTRAVENOUS | Status: DC
  Administered 2018-10-08 – 2018-10-11 (×4): via INTRAVENOUS

## 2018-10-08 MED ORDER — LORAZEPAM 2 MG/ML IJ SOLN
2.0000 mg | INTRAMUSCULAR | Status: DC | PRN
  Administered 2018-10-08 (×3): 3 mg via INTRAVENOUS
  Administered 2018-10-09 (×3): 2 mg via INTRAVENOUS
  Administered 2018-10-09: 3 mg via INTRAVENOUS
  Administered 2018-10-09 – 2018-10-12 (×9): 2 mg via INTRAVENOUS
  Filled 2018-10-08: qty 1
  Filled 2018-10-08: qty 2
  Filled 2018-10-08: qty 1
  Filled 2018-10-08 (×2): qty 2
  Filled 2018-10-08 (×2): qty 1
  Filled 2018-10-08: qty 2
  Filled 2018-10-08 (×8): qty 1
  Filled 2018-10-08: qty 2

## 2018-10-08 MED ORDER — SODIUM CHLORIDE 3 % IV SOLN
INTRAVENOUS | Status: DC
  Administered 2018-10-08: 50 mL/h via INTRAVENOUS
  Filled 2018-10-08: qty 500

## 2018-10-08 NOTE — ED Notes (Signed)
Critical value

## 2018-10-08 NOTE — ED Notes (Signed)
CRITICAL VALUE ALERT  Critical Value:  Na 117  Date & Time Notied:  09/28/2018, 1412  Provider Notified: Dr. Roderic Palau  Orders Received/Actions taken: see chart

## 2018-10-08 NOTE — ED Notes (Signed)
CRITICAL VALUE ALERT  Critical Value:  Na 117  Date & Time Notied: 10/08/2018 1402  Provider Notified: Dr. Luan Pulling   Orders Received/Actions taken: None yet

## 2018-10-08 NOTE — ED Triage Notes (Signed)
Patient brought in by EMS for complaint of fall with laceration to head. Patient has been drinking today. Per EMS, patient is staying at [facility] at this time due to argument with his mother.

## 2018-10-08 NOTE — H&P (Signed)
History and Physical    [name] QMG:[id number] DOB: 07/26/1960 DOA: 10/08/2018  PCP: Sinda Du, MD  Patient coming from: Home  I have personally briefly reviewed patient's old medical records in [place]  Chief Complaint: Fall  HPI: [name] is a 58 y.o. male with medical history significant of alcoholism, hepatitis C, cirrhosis, bipolar disorder, hypothyroidism, presents to the hospital after suffering a fall.  Patient complained of a laceration to his scalp.  He had slipped in the bathroom and fell, striking his head onto the railing.  At this time, patient has been medicated with Ativan and is unable to participate in history.  History is obtained per the medical record.  Patient admits to daily alcohol use.  He complains of mild chest discomfort to his anterior chest, but felt this was secondary to his fall.  He denies any loss of consciousness.  [facility] has [facility] at [facility]  ED Course: On arrival to the emergency room, he is noted to be significantly hyponatremic.  He is somewhat tremulous and there is concern for developing withdrawal.  CT scanning did not show any deep head injury.  Scalp injury was repaired with staples in the emergency room.  He has been referred for admission for further management of hyponatremia.  Review of Systems: As per HPI otherwise 10 point review of systems negative.    Past Medical History:  Diagnosis Date  . Anxiety   . Bipolar disorder ([place])   . BPH (benign prostatic hyperplasia)   . Cirrhosis ([place])   . COPD (chronic obstructive pulmonary disease) ([place])   . Depression   . Epicondylitis    hips  . ETOH abuse   . GERD (gastroesophageal reflux disease)   . Head injury   . Hepatitis C   . Hypothyroidism   . Oxygen decrease   . Pancreatitis chronic   . Seizure ([place])    had 1 grand mal seizure, no more, unknown etiology and no more seizures.  . Shortness of breath     Past Surgical History:  Procedure  Laterality Date  . APPENDECTOMY  age 38  . BIOPSY  03/22/2017   Procedure: BIOPSY;  Surgeon: Danie Binder, MD;  [facility];  Service: Endoscopy;;  . COLONOSCOPY  07/22/2012   Procedure: COLONOSCOPY;  Surgeon: Rogene Houston, MD;  [facility];  Service: Endoscopy;  Laterality: N/A;  225-moved to 200 Ann notified pt  . COLONOSCOPY WITH PROPOFOL N/A 02/20/2018   Procedure: COLONOSCOPY WITH PROPOFOL;  Surgeon: Rogene Houston, MD;  [facility];  Service: Endoscopy;  Laterality: N/A;  2:00-rescheduled to 8/30 @ 11:00am per Lelon Frohlich  . ESOPHAGOGASTRODUODENOSCOPY N/A 03/22/2017   Procedure: ESOPHAGOGASTRODUODENOSCOPY (EGD);  Surgeon: Danie Binder, MD;  [facility];  Service: Endoscopy;  Laterality: N/A;  . FASCIOTOMY Left 08/30/2012   Procedure: FASCIOTOMY;  Surgeon: Johnn Hai, MD;  Location[facility];  Service: Orthopedics;  Laterality: Left;  . FEMUR FRACTURE SURGERY     otif  . HERNIA REPAIR Right   . I&D EXTREMITY Left 09/03/2012   Procedure: IRRIGATION AND DEBRIDEMENT EXTREMITY;  Surgeon: Johnny Bridge, MD;  Location: [facility];  Service: Orthopedics;  Laterality: Left;  . INGUINAL HERNIA REPAIR Right 11/24/2017   Procedure: HERNIA REPAIR INGUINAL ADULT WITH MESH;  Surgeon: Aviva Signs, MD;  Location: [facility];  Service: General;  Laterality: Right;  . POLYPECTOMY  02/20/2018   Procedure: POLYPECTOMY;  Surgeon: Rogene Houston, MD;  [facility];  Service: Endoscopy;;  colon  . SKIN SPLIT GRAFT Left 09/08/2012   Procedure:  SPLIT THICKNESS SKIN GRAFT LEFT LEG ;  Surgeon: Rozanna Box, MD;  Location: [facility];  Service: Orthopedics;  Laterality: Left;  . TOTAL HIP ARTHROPLASTY  1 year ago   right hip-[place]  . TOTAL HIP REVISION Right 10/25/2016   Procedure: Revision right hip constrained liner;  Surgeon: Gaynelle Arabian, MD;  [facility];  Service: Orthopedics;  Laterality: Right;  . TRANSURETHRAL RESECTION OF PROSTATE  05/23/2011    Procedure: TRANSURETHRAL RESECTION OF THE PROSTATE (TURP);  Surgeon: Marissa Nestle;  Location[facility];  Service: Urology;  Laterality: N/A;  placement of suprapubic catheter    Social History:  reports that he has been smoking cigarettes. He has a 15.00 pack-year smoking history. He has never used smokeless tobacco. He reports current alcohol use of about 14.0 standard drinks of alcohol per week. He reports previous drug use. Drug: Marijuana.  Allergies  Allergen Reactions  . Penicillins Anaphylaxis    Has patient had a PCN reaction causing immediate rash, facial/tongue/throat swelling, SOB or lightheadedness with hypotension: Yes Has patient had a PCN reaction causing severe rash involving mucus membranes or skin necrosis:No Has patient had a PCN reaction that required hospitalization:No Has patient had a PCN reaction occurring within the last 10 years: No If all of the above answers are "NO", then may proceed with Cephalosporin use.   . Vancomycin     redman syndrome  . Codeine Hives  . Penicillins Hives    Has patient had a PCN reaction causing immediate rash, facial/tongue/throat swelling, SOB or lightheadedness with hypotension: Yes Has patient had a PCN reaction causing severe rash involving mucus membranes or skin necrosis: No Has patient had a PCN reaction that required hospitalization: No Has patient had a PCN reaction occurring within the last 10 years: No If all of the above answers are "NO", then may proceed with Cephalosporin use.   . Tylenol [Acetaminophen] Other (See Comments)    Patient has hepatitis    Family History  Problem Relation Age of Onset  . Anesthesia problems Neg Hx   . Hypotension Neg Hx   . Pseudochol deficiency Neg Hx   . Malignant hyperthermia Neg Hx      Prior to Admission medications   Medication Sig Start Date End Date Taking? Authorizing Provider  Buprenorphine HCl-Naloxone HCl (SUBOXONE) 4-1 MG FILM Place 2 Film under the tongue See  admin instructions. Dissolve 2 films under the tongue twice daily - May use one additional strip on 10 bad days a month   Yes [provider]  clonazePAM (KLONOPIN) 0.5 MG tablet Take 0.5 mg by mouth daily as needed. 09/25/18  Yes [provider]  lamoTRIgine (LAMICTAL) 100 MG tablet Take 1 tablet (100 mg total) by mouth 2 (two) times daily. 03/22/17 01/22/19 Yes Johnson, Clanford L, MD  levothyroxine (SYNTHROID, LEVOTHROID) 88 MCG tablet Take 1 tablet (88 mcg total) by mouth daily. 03/22/17 01/22/19 Yes Johnson, Clanford L, MD  lithium carbonate 300 MG capsule Take 1 capsule (300 mg total) by mouth 2 (two) times daily with a meal. 03/22/17 01/22/19 Yes Johnson, Clanford L, MD  NARCAN 4 MG/0.1ML LIQD nasal spray kit Place 4 mg into the nose See admin instructions. Administer a single spray of narcan in one nostril, repeat every 3 minutes as needed if no or minimal response 01/01/18  Yes  [provider]  OXYGEN Inhale 2.5 L into the lungs daily as needed (for breathing).    Yes [provider]  pantoprazole (PROTONIX) 40 MG tablet TAKE 1 TABLET BY MOUTH TWICE DAILY BEFORE MEALS 08/26/18  Yes Setzer, Terri L, NP  sertraline (ZOLOFT) 100 MG tablet Take 100 mg by mouth daily. 08/06/18  Yes [provider]    Physical Exam: Vitals:   10/08/18 1400 10/08/18 1445 10/08/18 1500 10/08/18 1653  BP: 114/70     Pulse:   (!) 106   Resp: '19  15 13  '$ Temp:  98.2 F (36.8 C)  (!) 96.3 F (35.7 C)  TempSrc:  Oral  Axillary  SpO2:   96%   Weight:      Height:        Constitutional: NAD, calm, comfortable Eyes: PERRL, lids and conjunctivae normal ENMT: Mucous membranes are dry. Posterior pharynx clear of any exudate or lesions.Normal dentition.  Neck: normal, supple, no masses, no thyromegaly Respiratory: clear to auscultation bilaterally, no wheezing, no crackles. Normal respiratory effort. No accessory muscle use.  Cardiovascular: Regular rate and rhythm, no murmurs /  rubs / gallops. No extremity edema. 2+ pedal pulses. No carotid bruits.  Abdomen: no tenderness, no masses palpated. No hepatosplenomegaly. Bowel sounds positive.  Musculoskeletal: no clubbing / cyanosis. No joint deformity upper and lower extremities. Good ROM, no contractures. Normal muscle tone.  Skin: no rashes, lesions, ulcers. No induration Neurologic: Patient does not wake up and participate in exam Psychiatric: Somnolent, medicated   Labs on Admission: I have personally reviewed following labs and imaging studies  CBC: Recent Labs  Lab 10/08/18 0948  WBC 11.0*  NEUTROABS 9.7*  HGB 12.0*  HCT 32.3*  MCV 88.3  PLT 967   Basic Metabolic Panel: Recent Labs  Lab 10/08/18 0948 10/08/18 1314  NA 115*  113* 117*  K 3.4*  --   CL 73*  --   CO2 28  --   GLUCOSE 113*  --   BUN 12  --   CREATININE 0.57*  --   CALCIUM 8.9  --    GFR: Estimated Creatinine Clearance: 91.8 mL/min (A) (by C-G formula based on SCr of 0.57 mg/dL (L)). Liver Function Tests: No results for input(s): AST, ALT, ALKPHOS, BILITOT, PROT, ALBUMIN in the last 168 hours. No results for input(s): LIPASE, AMYLASE in the last 168 hours. No results for input(s): AMMONIA in the last 168 hours. Coagulation Profile: No results for input(s): INR, PROTIME in the last 168 hours. Cardiac Enzymes: No results for input(s): CKTOTAL, CKMB, CKMBINDEX, TROPONINI in the last 168 hours. BNP (last 3 results) No results for input(s): PROBNP in the last 8760 hours. HbA1C: No results for input(s): HGBA1C in the last 72 hours. CBG: No results for input(s): GLUCAP in the last 168 hours. Lipid Profile: No results for input(s): CHOL, HDL, LDLCALC, TRIG, CHOLHDL, LDLDIRECT in the last 72 hours. Thyroid Function Tests: Recent Labs    10/08/18 1041  TSH 9.001*   Anemia Panel: No results for input(s): VITAMINB12, FOLATE, FERRITIN, TIBC, IRON, RETICCTPCT in the last 72 hours. Urine analysis:    Component Value Date/Time    COLORURINE YELLOW 10/08/2018 1034   APPEARANCEUR CLEAR 10/08/2018 1034   LABSPEC 1.005 10/08/2018 1034   PHURINE 7.0 10/08/2018 [place] 10/08/2018 1034   HGBUR NEGATIVE 10/08/2018 [place] 10/08/2018 1034   [place] 10/08/2018 1034   PROTEINUR NEGATIVE 10/08/2018 1034   UROBILINOGEN 0.2  11/11/2014 0315   NITRITE NEGATIVE 10/08/2018 1034   LEUKOCYTESUR NEGATIVE 10/08/2018 1034    Radiological Exams on Admission: Dg Chest 1 View  Result Date: 10/08/2018 CLINICAL DATA:  ETOH, fall, chest pain EXAM: CHEST  1 VIEW COMPARISON:  12/16/2017 FINDINGS: The heart size and mediastinal contours are within normal limits. Both lungs are clear. The visualized skeletal structures are unremarkable. IMPRESSION: No acute abnormality of the lungs in [facility] projection. Electronically Signed   By: Eddie Candle M.D.   On: 10/08/2018 10:29   Ct Head Wo Contrast  Result Date: 10/08/2018 CLINICAL DATA:  Fall. EXAM: CT HEAD WITHOUT CONTRAST CT CERVICAL SPINE WITHOUT CONTRAST TECHNIQUE: Multidetector CT imaging of the head and cervical spine was performed following the standard protocol without intravenous contrast. Multiplanar CT image reconstructions of the cervical spine were also generated. COMPARISON:  CT head and cervical spine dated December 16, 2017. FINDINGS: CT HEAD FINDINGS Brain: No evidence of acute infarction, hemorrhage, hydrocephalus, extra-axial collection or mass lesion/mass effect. Stable mild atrophy slightly asymmetric to the left hemisphere. Stable mild chronic microvascular ischemic changes. Vascular: Atherosclerotic vascular calcification of the carotid siphons. No hyperdense vessel. Skull: Normal. Negative for fracture or focal lesion. Sinuses/Orbits: Mild bilateral paranasal sinus mucosal thickening, improved when compared to prior study. No air-fluid levels. The orbits are unremarkable. Other: High right-sided scalp laceration at the vertex status post  stapling. CT CERVICAL SPINE FINDINGS Alignment: Normal. Skull base and vertebrae: No acute fracture. No primary bone lesion or focal pathologic process. Soft tissues and spinal canal: No prevertebral fluid or swelling. No visible canal hematoma. Disc levels: Unchanged mild disc height loss at C5-C6 and C6-C7. Unchanged mild-to-moderate right facet arthropathy at C2-C3. Upper chest: Unchanged emphysema. Other: None. IMPRESSION: 1. No acute intracranial abnormality. High right-sided scalp laceration at the vertex status post stapling. 2.  No acute cervical spine fracture. 3.  Emphysema (ICD10-J43.9). Electronically Signed   By: Titus Dubin M.D.   On: 10/08/2018 11:04   Ct Cervical Spine Wo Contrast  Result Date: 10/08/2018 CLINICAL DATA:  Fall. EXAM: CT HEAD WITHOUT CONTRAST CT CERVICAL SPINE WITHOUT CONTRAST TECHNIQUE: Multidetector CT imaging of the head and cervical spine was performed following the standard protocol without intravenous contrast. Multiplanar CT image reconstructions of the cervical spine were also generated. COMPARISON:  CT head and cervical spine dated December 16, 2017. FINDINGS: CT HEAD FINDINGS Brain: No evidence of acute infarction, hemorrhage, hydrocephalus, extra-axial collection or mass lesion/mass effect. Stable mild atrophy slightly asymmetric to the left hemisphere. Stable mild chronic microvascular ischemic changes. Vascular: Atherosclerotic vascular calcification of the carotid siphons. No hyperdense vessel. Skull: Normal. Negative for fracture or focal lesion. Sinuses/Orbits: Mild bilateral paranasal sinus mucosal thickening, improved when compared to prior study. No air-fluid levels. The orbits are unremarkable. Other: High right-sided scalp laceration at the vertex status post stapling. CT CERVICAL SPINE FINDINGS Alignment: Normal. Skull base and vertebrae: No acute fracture. No primary bone lesion or focal pathologic process. Soft tissues and spinal canal: No prevertebral fluid  or swelling. No visible canal hematoma. Disc levels: Unchanged mild disc height loss at C5-C6 and C6-C7. Unchanged mild-to-moderate right facet arthropathy at C2-C3. Upper chest: Unchanged emphysema. Other: None. IMPRESSION: 1. No acute intracranial abnormality. High right-sided scalp laceration at the vertex status post stapling. 2.  No acute cervical spine fracture. 3.  Emphysema (ICD10-J43.9). Electronically Signed   By: Titus Dubin M.D.   On: 10/08/2018 11:04    EKG: Independently reviewed. Sinus rhythm with prolonged QT  Assessment/Plan Active Problems:   Cirrhosis ([facility])   Hepatitis C   Hypothyroid   COPD (chronic obstructive pulmonary disease) ([place])   Fall   Bipolar disorder ([place])   H/O ETOH abuse   Hyponatremia     1. Hyponatremia.  Suspect secondary to diminished p.o. intake and beer potomania.  He was started on hypertonic saline in the emergency room.  Serum sodium has since improved.  I suspect he may be approaching approximately 120.  Will discontinue further hypertonic saline and start on normal saline.  Continue to monitor.  No signs of seizures at this time. 2. History of alcoholism with concern for alcohol withdrawal.  Currently on CIWA protocol.  Staff report that he was becoming agitated earlier. 3. Hypothyroidism.  TSH is elevated.  I suspect he may not be taking his medication as prescribed.  Resume home dose for now. 4. Prolonged QT interval.  Recheck in a.m.  Hold further QT prolonging agents including sertraline, lithium, Subutex. 5. Bipolar disorder.  Chronically on lithium, but level is undetectable.  Doubtful that he has been taking this.  Will need to confirm once he is more coherent. 6. Hepatitis C cirrhosis.  Stable.  DVT prophylaxis: Lovenox Code Status: Full code Family Communication: No family present Disposition Plan: Discharge home once serum sodium has improved and no evidence of alcohol withdrawal Consults called:   Admission status: Inpatient,  stepdown  Kathie Dike MD [facility]   If 7PM-7AM, please contact night-coverage [email or web address]   10/08/2018, 5:02 PM

## 2018-10-08 NOTE — Progress Notes (Signed)
CRITICAL VALUE ALERT  Critical Value:  Serum osmolality 236  Date & Time Notied:  10/08/18 @ 1540  Provider Notified: Dr. Roderic Palau  Orders Received/Actions taken: no new orders at this time.

## 2018-10-08 NOTE — ED Notes (Signed)
CRITICAL VALUE ALERT  Critical Value:  Na 113  Date & Time Notied:  10/08/2018, 1020  Provider Notified: Cyndi Bender PA  Orders Received/Actions taken: see chart

## 2018-10-08 NOTE — ED Provider Notes (Signed)
[facility] EMERGENCY DEPARTMENT Provider Note   CSN: [id number] Arrival date & time: 10/08/18  0901    History   Chief Complaint Chief Complaint  Patient presents with   Laceration    HPI [name] is a 58 y.o. male.     HPI   [name] is a 58 y.o. male who presents to the Emergency Department complaining of laceration to his scalp that occurred just prior to ER arrival.  He states that he slipped in the bathroom this morning, striking his head on the toilet railing.  He admits to daily alcohol use, but states that his last alcohol consumption was 2:00 pm yesterday although per the triage nurse, he admits to alcohol use this morning.  He complains of mild discomfort to his anterior chest which he believes is secondary to his fall.  He denies neck, back, abdominal and hip pain.  No shortness of breath, fever, cough or recent illness.  He denies LOC, headache, dizziness, visual changes and vomiting.  Last Td is unknown.  He denies blood thinner use.  He is staying with a friend at a [employer] and his friend contacted EMS.      Past Medical History:  Diagnosis Date   Anxiety    Bipolar disorder ([place])    BPH (benign prostatic hyperplasia)    Cirrhosis (HCC)    COPD (chronic obstructive pulmonary disease) (HCC)    Depression    Epicondylitis    hips   ETOH abuse    GERD (gastroesophageal reflux disease)    Head injury    Hepatitis C    Hypothyroidism    Oxygen decrease    Pancreatitis chronic    Seizure ([place])    had 1 grand mal seizure, no more, unknown etiology and no more seizures.   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   Failed total hip arthroplasty,  subsequent encounter 10/25/2016   Alcohol abuse with alcohol-induced disorder ([place]) 04/16/2016   Acute narcotic withdrawal with delirium ([place]) 11/12/2014   H/O ETOH abuse 11/10/2014   Encephalopathy acute 11/10/2014   Chronic pain 09/10/2012   Bipolar disorder ([place]) 09/10/2012   Open wound, L leg 09/10/2012   Fall 09/01/2012   Fracture of ribs, right 11 & 12 08/30/2012   Closed fracture of transverse process of lumbar vertebra L2 & L3 08/30/2012   Tibia/fibula fracture 08/30/2012   Traumatic compartment syndrome of left leg 08/30/2012   COPD (chronic obstructive pulmonary disease) ([place])    Anxiety    Hepatitis C 07/09/2012   Hypothyroid 07/09/2012   Constipation 03/17/2012   Cirrhosis ([place]) 03/17/2012   Chronic pancreatitis ([place]) 03/14/2008    Past Surgical History:  Procedure Laterality Date   APPENDECTOMY  age 24   BIOPSY  03/22/2017   Procedure: BIOPSY;  Surgeon: Danie Binder, MD;  [facility];  Service: Endoscopy;;   COLONOSCOPY  07/22/2012   Procedure: COLONOSCOPY;  Surgeon: Rogene Houston, MD;  [facility];  Service: Endoscopy;  Laterality: N/A;  225-moved to 200 Ann notified pt   COLONOSCOPY WITH PROPOFOL N/A 02/20/2018   Procedure: COLONOSCOPY WITH PROPOFOL;  Surgeon: Rogene Houston, MD;  [facility];  Service: Endoscopy;  Laterality: N/A;  2:00-rescheduled to 8/30 @ 11:00am per Ann   ESOPHAGOGASTRODUODENOSCOPY N/A 03/22/2017   Procedure: ESOPHAGOGASTRODUODENOSCOPY (EGD);  Surgeon: Danie Binder, MD;  [facility];  Service: Endoscopy;  Laterality: N/A;   FASCIOTOMY Left 08/30/2012   Procedure: FASCIOTOMY;  Surgeon: Johnn Hai, MD;  Location: [facility];  Service: Orthopedics;  Laterality: Left;   FEMUR FRACTURE SURGERY     otif   HERNIA REPAIR Right    I&D EXTREMITY Left 09/03/2012   Procedure: IRRIGATION AND DEBRIDEMENT EXTREMITY;  Surgeon: Johnny Bridge, MD;  Location: [place];  Service:  Orthopedics;  Laterality: Left;   INGUINAL HERNIA REPAIR Right 11/24/2017   Procedure: HERNIA REPAIR INGUINAL ADULT WITH MESH;  Surgeon: Aviva Signs, MD;  Location: [facility];  Service: General;  Laterality: Right;   POLYPECTOMY  02/20/2018   Procedure: POLYPECTOMY;  Surgeon: Rogene Houston, MD;  [facility];  Service: Endoscopy;;  colon   SKIN SPLIT GRAFT Left 09/08/2012   Procedure:  SPLIT THICKNESS SKIN GRAFT LEFT LEG ;  Surgeon: Rozanna Box, MD;  Location: [place];  Service: Orthopedics;  Laterality: Left;   TOTAL HIP ARTHROPLASTY  1 year ago   right hip-[place]   TOTAL HIP REVISION Right 10/25/2016   Procedure: Revision right hip constrained liner;  Surgeon: Gaynelle Arabian, MD;  [facility];  Service: Orthopedics;  Laterality: Right;   TRANSURETHRAL RESECTION OF PROSTATE  05/23/2011   Procedure: TRANSURETHRAL RESECTION OF THE PROSTATE (TURP);  Surgeon: Marissa Nestle;  Location: [facility];  Service: Urology;  Laterality: N/A;  placement of suprapubic catheter        Home Medications    Prior to Admission medications   Medication Sig Start Date End Date Taking? Authorizing Provider  BUTRANS 20 MCG/HR PTWK patch Place 20 mcg onto the skin every Thursday. 01/02/18   [provider]  lamoTRIgine (LAMICTAL) 100 MG tablet Take 1 tablet (100 mg total) by mouth 2 (two) times daily. 03/22/17 01/22/19  Johnson, Clanford L, MD  levothyroxine (SYNTHROID, LEVOTHROID) 88 MCG tablet Take 1 tablet (88 mcg total) by mouth daily. 03/22/17 01/22/19  Murlean Iba, MD  lithium carbonate 300 MG capsule Take 1 capsule (300 mg total) by mouth 2 (two) times daily with a meal. 03/22/17 01/22/19  Johnson, Clanford L, MD  mometasone-formoterol (DULERA) 200-5 MCG/ACT AERO Inhale 2 puffs into the lungs 3 (three) times daily.     [provider]  NARCAN 4 MG/0.1ML LIQD nasal spray kit Place 4 mg into the nose See admin instructions. Administer a single spray of narcan in  one nostril, repeat every 3 minutes as needed if no or minimal response 01/01/18   [provider]  oxyCODONE (OXY IR/ROXICODONE) 5 MG immediate release tablet Take 10 mg by mouth every 6 (six) hours as needed. for pain 12/04/17   [provider]  OXYGEN Inhale 2.5 L into the lungs daily as needed (for breathing).     [provider]  pantoprazole (PROTONIX) 40 MG tablet TAKE 1 TABLET BY MOUTH TWICE DAILY BEFORE MEALS 08/26/18   Setzer, Rona Ravens, NP  promethazine (PHENERGAN) 25 MG tablet Take 25 mg by mouth 4 (four) times daily as needed for nausea/vomiting. 10/09/17   [provider]    Family History Family History  Problem Relation Age of Onset   Anesthesia problems Neg Hx  Hypotension Neg Hx    Pseudochol deficiency Neg Hx    Malignant hyperthermia Neg Hx     Social History Social History   Tobacco Use   Smoking status: Current Every Day Smoker    Packs/day: 0.50    Years: 30.00    Pack years: 15.00    Types: Cigarettes   Smokeless tobacco: Never Used  Substance Use Topics   Alcohol use: Yes    Alcohol/week: 14.0 standard drinks    Types: 14 Cans of beer per week    Comment: 3 beers per day   Drug use: Not Currently    Types: Marijuana    Comment: denies use 03/21/17     Allergies   Penicillins; Vancomycin; Codeine; Penicillins; and Tylenol [acetaminophen]   Review of Systems Review of Systems  Constitutional: Negative for chills and fever.  HENT:       Scalp laceration  Eyes: Negative for visual disturbance.  Respiratory: Negative for cough, chest tightness and shortness of breath.   Cardiovascular: Positive for chest pain (mild anterior chest pain).  Gastrointestinal: Negative for abdominal pain, nausea and vomiting.  Musculoskeletal: Negative for arthralgias, back pain, joint swelling and neck pain.  Neurological: Negative for dizziness, syncope, weakness, numbness and headaches.  Hematological: Does not bruise/bleed  easily.     Physical Exam Updated Vital Signs BP 131/80    Pulse 78    Temp 98.3 F (36.8 C) (Oral)    Resp 18    Ht '5\' 7"'$  (1.702 m)    Wt 68 kg    SpO2 97%    BMI 23.49 kg/m   Physical Exam Vitals signs and nursing note reviewed.  Constitutional:      Appearance: Normal appearance. He is not ill-appearing.     Comments: Pt appears intoxicated although I do not smell alcohol.  Cooperative and answering questions appropriately, but speech is mumbled and difficult to understand    HENT:     Head:     Comments: 5 cm laceration of right frontal scalp.  No hematoma.  Mild bleeding noted.      Right Ear: Tympanic membrane and ear canal normal.     Left Ear: Tympanic membrane and ear canal normal.     Nose: Nose normal.     Mouth/Throat:     Mouth: Mucous membranes are moist.  Eyes:     Extraocular Movements: Extraocular movements intact.     Conjunctiva/sclera: Conjunctivae normal.     Pupils: Pupils are equal, round, and reactive to light.  Neck:     Musculoskeletal: Normal range of motion.  Cardiovascular:     Rate and Rhythm: Normal rate and regular rhythm.     Pulses: Normal pulses.  Pulmonary:     Effort: Pulmonary effort is normal.     Breath sounds: Normal breath sounds.  Abdominal:     General: There is no distension.     Palpations: Abdomen is soft.     Tenderness: There is no abdominal tenderness. There is no guarding.  Musculoskeletal: Normal range of motion.        General: No swelling or deformity.     Comments: Pt has full ROM of the neck, bilateral hips are non-tender w/o abnml rotation.  Neg SLR bilaterally.  No spinal tenderness to palp or bony deformity noted  Skin:    General: Skin is warm.     Capillary Refill: Capillary refill takes less than 2 seconds.  Neurological:     General: No focal  deficit present.     Mental Status: He is alert and oriented to person, place, and time.     GCS: GCS eye subscore is 4. GCS verbal subscore is 5. GCS motor subscore  is 6.     Sensory: Sensation is intact.     Motor: Motor function is intact.      ED Treatments / Results  Labs (all labs ordered are listed, but only abnormal results are displayed) Labs Reviewed  CBC WITH DIFFERENTIAL/PLATELET - Abnormal; Notable for the following components:      Result Value   WBC 11.0 (*)    RBC 3.66 (*)    Hemoglobin 12.0 (*)    HCT 32.3 (*)    MCHC 37.2 (*)    RDW 11.3 (*)    Neutro Abs 9.7 (*)    Lymphs Abs 0.4 (*)    All other components within normal limits  BASIC METABOLIC PANEL - Abnormal; Notable for the following components:   Sodium 113 (*)    Potassium 3.4 (*)    Chloride 73 (*)    Glucose, Bld 113 (*)    Creatinine, Ser 0.57 (*)    All other components within normal limits  ETHANOL  LITHIUM LEVEL  TSH  RAPID URINE DRUG SCREEN, HOSP PERFORMED  URINALYSIS, ROUTINE W REFLEX MICROSCOPIC  SODIUM  SODIUM  SODIUM  OSMOLALITY  OSMOLALITY, URINE  SODIUM, URINE, RANDOM    EKG EKG Interpretation  Date/Time:  Thursday October 08 2018 09:10:58 EDT Ventricular Rate:  80 PR Interval:    QRS Duration: 77 QT Interval:  439 QTC Calculation: 507 R Axis:   51 Text Interpretation:  Sinus rhythm Probable left atrial enlargement Prolonged QT interval Baseline wander in lead(s) V5 Confirmed by Elnora Morrison [phone]) on 10/08/2018 10:25:59 AM   Radiology Dg Chest 1 View  Result Date: 10/08/2018 CLINICAL DATA:  ETOH, fall, chest pain EXAM: CHEST  1 VIEW COMPARISON:  12/16/2017 FINDINGS: The heart size and mediastinal contours are within normal limits. Both lungs are clear. The visualized skeletal structures are unremarkable. IMPRESSION: No acute abnormality of the lungs in AP projection. Electronically Signed   By: Eddie Candle M.D.   On: 10/08/2018 10:29   Ct Head Wo Contrast  Result Date: 10/08/2018 CLINICAL DATA:  Fall. EXAM: CT HEAD WITHOUT CONTRAST CT CERVICAL SPINE WITHOUT CONTRAST TECHNIQUE: Multidetector CT imaging of the head and cervical  spine was performed following the standard protocol without intravenous contrast. Multiplanar CT image reconstructions of the cervical spine were also generated. COMPARISON:  CT head and cervical spine dated December 16, 2017. FINDINGS: CT HEAD FINDINGS Brain: No evidence of acute infarction, hemorrhage, hydrocephalus, extra-axial collection or mass lesion/mass effect. Stable mild atrophy slightly asymmetric to the left hemisphere. Stable mild chronic microvascular ischemic changes. Vascular: Atherosclerotic vascular calcification of the carotid siphons. No hyperdense vessel. Skull: Normal. Negative for fracture or focal lesion. Sinuses/Orbits: Mild bilateral paranasal sinus mucosal thickening, improved when compared to prior study. No air-fluid levels. The orbits are unremarkable. Other: High right-sided scalp laceration at the vertex status post stapling. CT CERVICAL SPINE FINDINGS Alignment: Normal. Skull base and vertebrae: No acute fracture. No primary bone lesion or focal pathologic process. Soft tissues and spinal canal: No prevertebral fluid or swelling. No visible canal hematoma. Disc levels: Unchanged mild disc height loss at C5-C6 and C6-C7. Unchanged mild-to-moderate right facet arthropathy at C2-C3. Upper chest: Unchanged emphysema. Other: None. IMPRESSION: 1. No acute intracranial abnormality. High right-sided scalp laceration at the vertex status  post stapling. 2.  No acute cervical spine fracture. 3.  Emphysema (ICD10-J43.9). Electronically Signed   By: Titus Dubin M.D.   On: 10/08/2018 11:04   Ct Cervical Spine Wo Contrast  Result Date: 10/08/2018 CLINICAL DATA:  Fall. EXAM: CT HEAD WITHOUT CONTRAST CT CERVICAL SPINE WITHOUT CONTRAST TECHNIQUE: Multidetector CT imaging of the head and cervical spine was performed following the standard protocol without intravenous contrast. Multiplanar CT image reconstructions of the cervical spine were also generated. COMPARISON:  CT head and cervical spine  dated December 16, 2017. FINDINGS: CT HEAD FINDINGS Brain: No evidence of acute infarction, hemorrhage, hydrocephalus, extra-axial collection or mass lesion/mass effect. Stable mild atrophy slightly asymmetric to the left hemisphere. Stable mild chronic microvascular ischemic changes. Vascular: Atherosclerotic vascular calcification of the carotid siphons. No hyperdense vessel. Skull: Normal. Negative for fracture or focal lesion. Sinuses/Orbits: Mild bilateral paranasal sinus mucosal thickening, improved when compared to prior study. No air-fluid levels. The orbits are unremarkable. Other: High right-sided scalp laceration at the vertex status post stapling. CT CERVICAL SPINE FINDINGS Alignment: Normal. Skull base and vertebrae: No acute fracture. No primary bone lesion or focal pathologic process. Soft tissues and spinal canal: No prevertebral fluid or swelling. No visible canal hematoma. Disc levels: Unchanged mild disc height loss at C5-C6 and C6-C7. Unchanged mild-to-moderate right facet arthropathy at C2-C3. Upper chest: Unchanged emphysema. Other: None. IMPRESSION: 1. No acute intracranial abnormality. High right-sided scalp laceration at the vertex status post stapling. 2.  No acute cervical spine fracture. 3.  Emphysema (ICD10-J43.9). Electronically Signed   By: Titus Dubin M.D.   On: 10/08/2018 11:04    Procedures Procedures (including critical care time)   LACERATION REPAIR Performed by: Cristian Davitt Authorized by: Natanel Snavely Consent: Verbal consent obtained. Risks and benefits: risks, benefits and alternatives were discussed Consent given by: patient Patient identity confirmed: provided demographic data Prepped and Draped in normal sterile fashion Wound explored  Laceration Location: right frontal scalp  Laceration Length: 5 cm  No Foreign Bodies seen or palpated  Anesthesia: local infiltration  Local anesthetic: lidocaine 2 % w/o epinephrine  Anesthetic total: 3  ml  Irrigation method: syringe Amount of cleaning: standard  Skin closure: staples  Number of staples: 10  Technique: stapling  Patient tolerance: Patient tolerated the procedure well with no immediate complications.  Medications Ordered in ED Medications  sodium chloride (hypertonic) 3 % solution (has no administration in time range)  Tdap (BOOSTRIX) injection 0.5 mL (0.5 mLs Intramuscular Given 10/08/18 0939)  lidocaine (XYLOCAINE) 2 % injection 5 mL (5 mLs Intradermal Given by Other 10/08/18 0940)     Initial Impression / Assessment and Plan / ED Course  I have reviewed the triage vital signs and the nursing notes.  Pertinent labs & imaging results that were available during my care of the patient were reviewed by me and considered in my medical decision making (see chart for details).         CRITICAL CARE Performed by: Hani Patnode Total critical care time: 30 minutes Critical care time was exclusive of separately billable procedures and treating other patients. Critical care was necessary to treat or prevent imminent or life-threatening deterioration. Critical care was time spent personally by me on the following activities: development of treatment plan with patient and/or surrogate as well as nursing, discussions with consultants, evaluation of patient's response to treatment, examination of patient, obtaining history from patient or surrogate, ordering and performing treatments and interventions, ordering and review of laboratory studies, ordering  and review of radiographic studies, pulse oximetry and re-evaluation of patient's condition.     Td updated. Laceration to frontal scalp, bleeding well controlled with stapling.  No hematoma. CT head and C Spine neg for acute bleed or fx  1035  Additional history:  Spoke with patient's son-in-law, Ruthann Cancer, he states that Mr. Dinkel was "acting strange" yesterday staring at the wall and telling him that he was "waiting at  the bank."  Son in law concerned for possible drug use stating that Mr. Buckingham has used drugs previously.  Per son in law, Mr. Vondrasek is staying by himself at the [employer] pharmacist, Donna Christen, regarding hypertonic saline dosing  On recheck, pt sleeping, easily awakened.  No seizure activity.    [place] hospitalist, Dr. Roderic Palau, will admit pt to step down.    Final Clinical Impressions(s) / ED Diagnoses   Final diagnoses:  Acute hyponatremia  Laceration of scalp, initial encounter    ED Discharge Orders    None       Kem Parkinson, PA-C 10/08/18 1236    Elnora Morrison, MD 10/10/18 [phone]

## 2018-10-08 NOTE — ED Notes (Signed)
CRITICAL VALUE ALERT  Critical Value:  Sodium 115  Date & Time Notied:  10/08/18 1148  Provider Notified: Dr. Reather Converse  Orders Received/Actions taken: EDP notified

## 2018-10-09 LAB — CBC
HCT: 30 % — ABNORMAL LOW (ref 39.0–52.0)
Hemoglobin: 10.6 g/dL — ABNORMAL LOW (ref 13.0–17.0)
MCH: 32.4 pg (ref 26.0–34.0)
MCHC: 35.3 g/dL (ref 30.0–36.0)
MCV: 91.7 fL (ref 80.0–100.0)
Platelets: 173 10*3/uL (ref 150–400)
RBC: 3.27 MIL/uL — ABNORMAL LOW (ref 4.22–5.81)
RDW: 11.9 % (ref 11.5–15.5)
WBC: 7.9 10*3/uL (ref 4.0–10.5)
nRBC: 0 % (ref 0.0–0.2)

## 2018-10-09 LAB — BASIC METABOLIC PANEL
Anion gap: 8 (ref 5–15)
BUN: 7 mg/dL (ref 6–20)
CO2: 26 mmol/L (ref 22–32)
Calcium: 8.2 mg/dL — ABNORMAL LOW (ref 8.9–10.3)
Chloride: 96 mmol/L — ABNORMAL LOW (ref 98–111)
Creatinine, Ser: 0.44 mg/dL — ABNORMAL LOW (ref 0.61–1.24)
GFR calc Af Amer: >60 mL/min (ref >60)
GFR calc non Af Amer: >60 mL/min (ref >60)
Glucose, Bld: 98 mg/dL (ref 70–99)
Potassium: 3.1 mmol/L — ABNORMAL LOW (ref 3.5–5.1)
Sodium: 130 mmol/L — ABNORMAL LOW (ref 135–145)

## 2018-10-09 LAB — AMMONIA: Ammonia: 28 umol/L (ref 9–35)

## 2018-10-09 LAB — HIV ANTIBODY (ROUTINE TESTING W REFLEX): HIV Screen 4th Generation wRfx: NONREACTIVE

## 2018-10-09 MED ORDER — POTASSIUM CHLORIDE CRYS ER 20 MEQ PO TBCR: 40.0000 meq | EXTENDED_RELEASE_TABLET | Freq: Once | ORAL | Status: DC

## 2018-10-09 MED ORDER — NICOTINE 21 MG/24HR TD PT24
21.0000 mg | MEDICATED_PATCH | Freq: Every day | TRANSDERMAL | Status: DC
  Administered 2018-10-09 – 2018-10-16 (×8): 21 mg via TRANSDERMAL
  Filled 2018-10-09 (×8): qty 1

## 2018-10-09 MED ORDER — POTASSIUM CHLORIDE 10 MEQ/100ML IV SOLN
10.0000 meq | INTRAVENOUS | Status: AC
  Administered 2018-10-09 (×4): 10 meq via INTRAVENOUS
  Filled 2018-10-09 (×4): qty 100

## 2018-10-09 NOTE — TOC Initial Note (Signed)
Transition of Care [facility]) - Initial/Assessment Note    Patient Details  Name: [name] MRN: [id number] Date of Birth: August 11, 1960  [facility] of [facility]) CM/SW Contact:    Shade Flood, LCSW Phone Number: 10/09/2018, 12:51 PM  Clinical Narrative:                  Pt admitted with concerns related to ETOH misuse. Pt currently experiencing withdrawal and unable to participate in assessment. Anticipating pt will remain hospitalized through the weekend. Will follow up Monday to attempt assessment.   Expected Discharge Plan: Home/Self Care Barriers to Discharge: Continued Medical Work up   Patient Goals and CMS Choice        Expected Discharge Plan and Services Expected Discharge Plan: Home/Self Care       Living arrangements for the past 2 months: Single Family Home, Hotel/Motel                          Prior Living Arrangements/Services Living arrangements for the past 2 months: Single Family Home, Hotel/Motel Lives with:: Friends Patient language and need for interpreter reviewed:: Yes        Need for Family Participation in Patient Care: No (Comment) Care giver support system in place?: Yes (comment)   Criminal Activity/Legal Involvement Pertinent to Current Situation/Hospitalization: No - Comment as needed  Activities of Daily Living      Permission Sought/Granted                  Emotional Assessment Appearance:: Appears stated age       Alcohol / Substance Use: Alcohol Use Psych Involvement: No (comment)  Admission diagnosis:  Acute hyponatremia [E87.1] Chest pain [R07.9] Laceration of scalp, initial encounter [S01.01XA] Chest pain, unspecified type [R07.9] Patient Active Problem List   Diagnosis Date Noted  . Hyponatremia 10/08/2018  . Prolonged QT interval 10/08/2018  . History of colonic polyps 12/11/2017  . Inguinal hernia, right   . Colon polyposis 10/08/2017  . GI bleeding 03/21/2017  . Anemia   . GI bleed due to NSAIDs    . Melena   . 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 Failed total hip arthroplasty, subsequent encounter 10/25/2016  . Alcohol abuse with alcohol-induced disorder ([place]) 04/16/2016  . Acute narcotic withdrawal with delirium ([place]) 11/12/2014  . H/O ETOH abuse 11/10/2014  . Encephalopathy acute 11/10/2014  . Chronic pain 09/10/2012  . Bipolar disorder ([place]) 09/10/2012  . Open wound, L leg 09/10/2012  . Fall 09/01/2012  . Fracture of ribs, right 11 & 12 08/30/2012  . Closed fracture of transverse process of lumbar vertebra L2 & L3 08/30/2012  . Tibia/fibula fracture 08/30/2012  . Traumatic compartment syndrome of left leg 08/30/2012  . COPD (chronic obstructive pulmonary disease) ([place])   . Anxiety   . Hepatitis C 07/09/2012  . Hypothyroid 07/09/2012  . Constipation 03/17/2012  . Cirrhosis ([place]) 03/17/2012  . Chronic pancreatitis ([place]) 03/14/2008   PCP:  Sinda Du, MD Pharmacy:   [place], [place] [address] [place] [address] Phone: [phone] Fax: [phone]     Social Determinants of Health (SDOH) Interventions    Readmission Risk Interventions No flowsheet data found.

## 2018-10-09 NOTE — Progress Notes (Signed)
Subjective: He was admitted yesterday with hyponatremia.  This is thought to be related to beer Poto mania and he has responded well and his sodium is now around 130.  At baseline he has alcoholism hepatitis C cirrhosis bipolar disease COPD hypothyroidism history of chronic pancreatitis chronic pain now on Suboxone.  I have not seen him in my office in several months and he appears to have lost approximately 30 pounds since I saw him last.  He had a fall with a laceration of his head but no evidence of any sort of intracranial injury.  He is having alcohol withdrawal and he is on protocol and sleepy now.  Objective: Vital signs in last 24 hours: Temp:  [96.3 F (35.7 C)-98.5 F (36.9 C)] 97.5 F (36.4 C) (04/17 0735) Pulse Rate:  [72-106] 72 (04/17 0800) Resp:  [9-22] 13 (04/17 0800) BP: (92-180)/(47-93) 92/55 (04/17 0800) SpO2:  [93 %-99 %] 96 % (04/17 0800) Weight:  [54.3 kg-68 kg] 54.3 kg (04/17 0500) Weight change:     Intake/Output from previous day: 04/16 0701 - 04/17 0700 In: 1165.3 [I.V.:1165.3] Out: 2000 [Urine:2000]  PHYSICAL EXAM General appearance: Sleepy generally unkempt Resp: rhonchi bilaterally Cardio: regular rate and rhythm, S1, S2 normal, no murmur, click, rub or gallop GI: soft, non-tender; bowel sounds normal; no masses,  no organomegaly Extremities: extremities normal, atraumatic, no cyanosis or edema  Lab Results:  Results for orders placed or performed during the hospital encounter of 10/08/18 (from the past 48 hour(s))  Osmolality     Status: Abnormal   Collection Time: 10/08/18  9:47 AM  Result Value Ref Range   Osmolality 236 (LL) 275 - 295 mOsm/kg    Comment: REPEATED TO VERIFY CRITICAL RESULT CALLED TO, READ BACK BY AND VERIFIED WITH: ELIZABETH MURPHY,RN AT 4765 10/08/2018 BY ZBEECH. Performed at [facility], [place] [address], [place], [place] [address]   CBC with Differential     Status: Abnormal   Collection Time: 10/08/18  9:48 AM   Result Value Ref Range   WBC 11.0 (H) 4.0 - 10.5 K/uL   RBC 3.66 (L) 4.22 - 5.81 MIL/uL   Hemoglobin 12.0 (L) 13.0 - 17.0 g/dL   HCT 32.3 (L) 39.0 - 52.0 %   MCV 88.3 80.0 - 100.0 fL   MCH 32.8 26.0 - 34.0 pg   MCHC 37.2 (H) 30.0 - 36.0 g/dL   RDW 11.3 (L) 11.5 - 15.5 %   Platelets 183 150 - 400 K/uL   nRBC 0.0 0.0 - 0.2 %   Neutrophils Relative % 88 %   Neutro Abs 9.7 (H) 1.7 - 7.7 K/uL   Lymphocytes Relative 4 %   Lymphs Abs 0.4 (L) 0.7 - 4.0 K/uL   Monocytes Relative 7 %   Monocytes Absolute 0.8 0.1 - 1.0 K/uL   Eosinophils Relative 0 %   Eosinophils Absolute 0.0 0.0 - 0.5 K/uL   Basophils Relative 0 %   Basophils Absolute 0.0 0.0 - 0.1 K/uL   WBC Morphology TOXIC GRANULATION    Immature Granulocytes 1 %   Abs Immature Granulocytes 0.07 0.00 - 0.07 K/uL    Comment: Performed at [facility], [address], [place], [place] [address]  Basic metabolic panel     Status: Abnormal   Collection Time: 10/08/18  9:48 AM  Result Value Ref Range   Sodium 113 (LL) 135 - 145 mmol/L    Comment: CRITICAL RESULT CALLED TO, READ BACK BY AND VERIFIED WITH: CARTWELL,L'@1019'$  BY MATTHEWS,  B 4.16.20    Potassium 3.4 (L) 3.5 - 5.1 mmol/L   Chloride 73 (L) 98 - 111 mmol/L   CO2 28 22 - 32 mmol/L   Glucose, Bld 113 (H) 70 - 99 mg/dL   BUN 12 6 - 20 mg/dL   Creatinine, Ser 0.57 (L) 0.61 - 1.24 mg/dL   Calcium 8.9 8.9 - 10.3 mg/dL   GFR calc non Af Amer >60 >60 mL/min   GFR calc Af Amer >60 >60 mL/min   Anion gap 12 5 - 15    Comment: Performed at [facility], [address], [place], [place] [address]  Ethanol     Status: None   Collection Time: 10/08/18  9:48 AM  Result Value Ref Range   Alcohol, Ethyl (B) <10 <10 mg/dL    Comment: (NOTE) Lowest detectable limit for serum alcohol is 10 mg/dL. For medical purposes only. Performed at [facility], [address], [place], [place] [address]   Sodium     Status: Abnormal   Collection Time: 10/08/18  9:48 AM  Result Value Ref Range    Sodium 115 (LL) 135 - 145 mmol/L    Comment: CRITICAL RESULT CALLED TO, READ BACK BY AND VERIFIED WITH: WHITE,M AT 1132 BY HUFFINES,S ON 10/08/18. Performed at [facility], [address] [place], [place] [address]   Urine rapid drug screen (hosp performed)     Status: None   Collection Time: 10/08/18 10:34 AM  Result Value Ref Range   Opiates NONE DETECTED NONE DETECTED   Cocaine NONE DETECTED NONE DETECTED   Benzodiazepines NONE DETECTED NONE DETECTED   Amphetamines NONE DETECTED NONE DETECTED   Tetrahydrocannabinol NONE DETECTED NONE DETECTED   Barbiturates NONE DETECTED NONE DETECTED    Comment: (NOTE) DRUG SCREEN FOR MEDICAL PURPOSES ONLY.  IF CONFIRMATION IS NEEDED FOR ANY PURPOSE, NOTIFY LAB WITHIN 5 DAYS. LOWEST DETECTABLE LIMITS FOR URINE DRUG SCREEN Drug Class                     Cutoff (ng/mL) Amphetamine and metabolites    1000 Barbiturate and metabolites    200 Benzodiazepine                 449 Tricyclics and metabolites     300 Opiates and metabolites        300 Cocaine and metabolites        300 THC                            50 Performed at [place] [place], [place] [address]   Urinalysis, Routine w reflex microscopic     Status: None   Collection Time: 10/08/18 10:34 AM  Result Value Ref Range   Color, Urine YELLOW YELLOW   APPearance CLEAR CLEAR   Specific Gravity, Urine 1.005 1.005 - 1.030   pH 7.0 5.0 - 8.0   Glucose, UA NEGATIVE NEGATIVE mg/dL   Hgb urine dipstick NEGATIVE NEGATIVE   Bilirubin Urine NEGATIVE NEGATIVE   Ketones, ur NEGATIVE NEGATIVE mg/dL   Protein, ur NEGATIVE NEGATIVE mg/dL   Nitrite NEGATIVE NEGATIVE   Leukocytes,Ua NEGATIVE NEGATIVE    Comment: Performed at [facility], [address] [place], [place] [address]  Lithium level     Status: Abnormal   Collection Time: 10/08/18 10:40 AM  Result Value Ref Range   Lithium Lvl <0.06 (L) 0.60 - 1.20 mmol/L    Comment:  Performed at [facility], [address] [place], [place] [address]  Osmolality, urine     Status: Abnormal   Collection Time: 10/08/18 10:40 AM  Result Value Ref Range   Osmolality, Ur 173 (L) 300 - 900 mOsm/kg    Comment: Performed at [place] [address], [place], [place] [address]  Sodium, urine, random     Status: None   Collection Time: 10/08/18 10:40 AM  Result Value Ref Range   Sodium, Ur <10 mmol/L    Comment: Performed at [facility], [address], [place], [place] [address]  TSH     Status: Abnormal   Collection Time: 10/08/18 10:41 AM  Result Value Ref Range   TSH 9.001 (H) 0.350 - 4.500 uIU/mL    Comment: Performed by a 3rd Generation assay with a functional sensitivity of <=0.01 uIU/mL. Performed at [facility], [address], [place], [place] [address]   Sodium     Status: Abnormal   Collection Time: 10/08/18  1:14 PM  Result Value Ref Range   Sodium 117 (LL) 135 - 145 mmol/L    Comment: CRITICAL RESULT CALLED TO, READ BACK BY AND VERIFIED WITH: WILLEY,E AT 1401 ON 4.16.20 BY ISLEY,B Performed at [facility], [address] [place], [place] [address]   Sodium     Status: Abnormal   Collection Time: 10/08/18  5:04 PM  Result Value Ref Range   Sodium 122 (L) 135 - 145 mmol/L    Comment: Performed at [facility], [address], [place], [place] [address]  HIV antibody (Routine Testing)     Status: None   Collection Time: 10/08/18  5:04 PM  Result Value Ref Range   HIV Screen 4th Generation wRfx Non Reactive Non Reactive    Comment: (NOTE) Performed At: [facility] [place], [place] [id number] Rush Farmer MD [id number]   MRSA PCR Screening     Status: None   Collection Time: 10/08/18  9:00 PM  Result Value Ref Range   MRSA by PCR NEGATIVE NEGATIVE    Comment:        The GeneXpert MRSA Assay (FDA approved for NASAL specimens only), is one component of a comprehensive MRSA colonization surveillance program. It is not intended to diagnose  MRSA infection nor to guide or monitor treatment for MRSA infections. Performed at [facility], [address], [place], [place] [address]   Basic metabolic panel     Status: Abnormal   Collection Time: 10/09/18  4:14 AM  Result Value Ref Range   Sodium 130 (L) 135 - 145 mmol/L    Comment: DELTA CHECK NOTED   Potassium 3.1 (L) 3.5 - 5.1 mmol/L   Chloride 96 (L) 98 - 111 mmol/L   CO2 26 22 - 32 mmol/L   Glucose, Bld 98 70 - 99 mg/dL   BUN 7 6 - 20 mg/dL   Creatinine, Ser 0.44 (L) 0.61 - 1.24 mg/dL   Calcium 8.2 (L) 8.9 - 10.3 mg/dL   GFR calc non Af Amer >60 >60 mL/min   GFR calc Af Amer >60 >60 mL/min   Anion gap 8 5 - 15    Comment: Performed at [facility], [address], [place], [place] [address]  CBC     Status: Abnormal   Collection Time: 10/09/18  4:14 AM  Result Value Ref Range   WBC 7.9 4.0 - 10.5 K/uL    Comment: WHITE COUNT CONFIRMED ON SMEAR   RBC  3.27 (L) 4.22 - 5.81 MIL/uL   Hemoglobin 10.6 (L) 13.0 - 17.0 g/dL   HCT 30.0 (L) 39.0 - 52.0 %   MCV 91.7 80.0 - 100.0 fL   MCH 32.4 26.0 - 34.0 pg   MCHC 35.3 30.0 - 36.0 g/dL   RDW 11.9 11.5 - 15.5 %   Platelets 173 150 - 400 K/uL   nRBC 0.0 0.0 - 0.2 %    Comment: Performed at [facility], [address], [place], [place] [address]    ABGS No results for input(s): PHART, PO2ART, TCO2, HCO3 in the last 72 hours.  Invalid input(s): PCO2 CULTURES Recent Results (from the past 240 hour(s))  MRSA PCR Screening     Status: None   Collection Time: 10/08/18  9:00 PM  Result Value Ref Range Status   MRSA by PCR NEGATIVE NEGATIVE Final    Comment:        The GeneXpert MRSA Assay (FDA approved for NASAL specimens only), is one component of a comprehensive MRSA colonization surveillance program. It is not intended to diagnose MRSA infection nor to guide or monitor treatment for MRSA infections. Performed at [facility], [address], [place], [place] [address]    Studies/Results: Dg Chest 1  View  Result Date: 10/08/2018 CLINICAL DATA:  ETOH, fall, chest pain EXAM: CHEST  1 VIEW COMPARISON:  12/16/2017 FINDINGS: The heart size and mediastinal contours are within normal limits. Both lungs are clear. The visualized skeletal structures are unremarkable. IMPRESSION: No acute abnormality of the lungs in AP projection. Electronically Signed   By: Eddie Candle M.D.   On: 10/08/2018 10:29   Ct Head Wo Contrast  Result Date: 10/08/2018 CLINICAL DATA:  Fall. EXAM: CT HEAD WITHOUT CONTRAST CT CERVICAL SPINE WITHOUT CONTRAST TECHNIQUE: Multidetector CT imaging of the head and cervical spine was performed following the standard protocol without intravenous contrast. Multiplanar CT image reconstructions of the cervical spine were also generated. COMPARISON:  CT head and cervical spine dated December 16, 2017. FINDINGS: CT HEAD FINDINGS Brain: No evidence of acute infarction, hemorrhage, hydrocephalus, extra-axial collection or mass lesion/mass effect. Stable mild atrophy slightly asymmetric to the left hemisphere. Stable mild chronic microvascular ischemic changes. Vascular: Atherosclerotic vascular calcification of the carotid siphons. No hyperdense vessel. Skull: Normal. Negative for fracture or focal lesion. Sinuses/Orbits: Mild bilateral paranasal sinus mucosal thickening, improved when compared to prior study. No air-fluid levels. The orbits are unremarkable. Other: High right-sided scalp laceration at the vertex status post stapling. CT CERVICAL SPINE FINDINGS Alignment: Normal. Skull base and vertebrae: No acute fracture. No primary bone lesion or focal pathologic process. Soft tissues and spinal canal: No prevertebral fluid or swelling. No visible canal hematoma. Disc levels: Unchanged mild disc height loss at C5-C6 and C6-C7. Unchanged mild-to-moderate right facet arthropathy at C2-C3. Upper chest: Unchanged emphysema. Other: None. IMPRESSION: 1. No acute intracranial abnormality. High right-sided scalp  laceration at the vertex status post stapling. 2.  No acute cervical spine fracture. 3.  Emphysema (ICD10-J43.9). Electronically Signed   By: Titus Dubin M.D.   On: 10/08/2018 11:04   Ct Cervical Spine Wo Contrast  Result Date: 10/08/2018 CLINICAL DATA:  Fall. EXAM: CT HEAD WITHOUT CONTRAST CT CERVICAL SPINE WITHOUT CONTRAST TECHNIQUE: Multidetector CT imaging of the head and cervical spine was performed following the standard protocol without intravenous contrast. Multiplanar CT image reconstructions of the cervical spine were also generated. COMPARISON:  CT head and cervical spine dated December 16, 2017. FINDINGS: CT HEAD FINDINGS Brain:  No evidence of acute infarction, hemorrhage, hydrocephalus, extra-axial collection or mass lesion/mass effect. Stable mild atrophy slightly asymmetric to the left hemisphere. Stable mild chronic microvascular ischemic changes. Vascular: Atherosclerotic vascular calcification of the carotid siphons. No hyperdense vessel. Skull: Normal. Negative for fracture or focal lesion. Sinuses/Orbits: Mild bilateral paranasal sinus mucosal thickening, improved when compared to prior study. No air-fluid levels. The orbits are unremarkable. Other: High right-sided scalp laceration at the vertex status post stapling. CT CERVICAL SPINE FINDINGS Alignment: Normal. Skull base and vertebrae: No acute fracture. No primary bone lesion or focal pathologic process. Soft tissues and spinal canal: No prevertebral fluid or swelling. No visible canal hematoma. Disc levels: Unchanged mild disc height loss at C5-C6 and C6-C7. Unchanged mild-to-moderate right facet arthropathy at C2-C3. Upper chest: Unchanged emphysema. Other: None. IMPRESSION: 1. No acute intracranial abnormality. High right-sided scalp laceration at the vertex status post stapling. 2.  No acute cervical spine fracture. 3.  Emphysema (ICD10-J43.9). Electronically Signed   By: Titus Dubin M.D.   On: 10/08/2018 11:04    Medications:   Prior to Admission:  Medications Prior to Admission  Medication Sig Dispense Refill Last Dose  . Buprenorphine HCl-Naloxone HCl (SUBOXONE) 4-1 MG FILM Place 2 Film under the tongue See admin instructions. Dissolve 2 films under the tongue twice daily - May use one additional strip on 10 bad days a month   unknown  . clonazePAM (KLONOPIN) 0.5 MG tablet Take 0.5 mg by mouth daily as needed.   unknown  . lamoTRIgine (LAMICTAL) 100 MG tablet Take 1 tablet (100 mg total) by mouth 2 (two) times daily. 60 tablet 0 unknown  . levothyroxine (SYNTHROID, LEVOTHROID) 88 MCG tablet Take 1 tablet (88 mcg total) by mouth daily. 30 tablet 0 unknown  . lithium carbonate 300 MG capsule Take 1 capsule (300 mg total) by mouth 2 (two) times daily with a meal. 60 capsule 0 unknown  . NARCAN 4 MG/0.1ML LIQD nasal spray kit Place 4 mg into the nose See admin instructions. Administer a single spray of narcan in one nostril, repeat every 3 minutes as needed if no or minimal response  5 unknown  . OXYGEN Inhale 2.5 L into the lungs daily as needed (for breathing).    unknown  . pantoprazole (PROTONIX) 40 MG tablet TAKE 1 TABLET BY MOUTH TWICE DAILY BEFORE MEALS 60 tablet 3 unknown  . sertraline (ZOLOFT) 100 MG tablet Take 100 mg by mouth daily.   unknown   Scheduled: . enoxaparin (LOVENOX) injection  40 mg Subcutaneous Q24H  . lamoTRIgine  100 mg Oral BID  . levothyroxine  88 mcg Oral Daily  . pantoprazole  40 mg Oral BID AC   Continuous: . 0.9 % NaCl with KCl 20 mEq / L 50 mL/hr at 10/09/18 0810  . potassium chloride 10 mEq (10/09/18 0813)   SAY:[id number] **OR** acetaminophen, LORazepam, ondansetron **OR** ondansetron (ZOFRAN) IV  Assesment: He was admitted with hyponatremia likely related to beer Potomania.  He is better.  He still has hypokalemia and that will need to be replaced.  I do not think he is going to be able to take anything orally so we will give him IV potassium in addition to the potassium in  his IV fluids.  He had prolonged QT interval which may improve with potassium replacement  He has significant history of alcohol abuse and is having withdrawal and is on protocol.  He has COPD with ongoing smoking  He has cirrhosis of the liver which  does not appear to be changed Active Problems:   Cirrhosis ([place])   Hepatitis C   Hypothyroid   COPD (chronic obstructive pulmonary disease) ([place])   Fall   Bipolar disorder ([place])   H/O ETOH abuse   Hyponatremia   Prolonged QT interval    Plan: Replace his potassium.  Reduce his IV fluids.  Repeat lab work    LOS: 1 day   Alonza Bogus 10/09/2018, 8:33 AM

## 2018-10-10 LAB — BASIC METABOLIC PANEL
Anion gap: 7 (ref 5–15)
BUN: 7 mg/dL (ref 6–20)
CO2: 25 mmol/L (ref 22–32)
Calcium: 8.4 mg/dL — ABNORMAL LOW (ref 8.9–10.3)
Chloride: 105 mmol/L (ref 98–111)
Creatinine, Ser: 0.48 mg/dL — ABNORMAL LOW (ref 0.61–1.24)
GFR calc Af Amer: >60 mL/min (ref >60)
GFR calc non Af Amer: >60 mL/min (ref >60)
Glucose, Bld: 90 mg/dL (ref 70–99)
Potassium: 3.6 mmol/L (ref 3.5–5.1)
Sodium: 137 mmol/L (ref 135–145)

## 2018-10-10 NOTE — Progress Notes (Signed)
Patient bladder scan revealed >600cc urine. Patient attempted to pee multiple times with no success. Foley catheter inserted and 650 cc out.

## 2018-10-10 NOTE — Progress Notes (Signed)
Subjective: He says he feels better.  He told the nurse she was in the hospital but then he asked me Where he was.  He is much more alert.  He is sitting up and eating.  He is coughing and I think he is probably having some difficulty with swallowing.  No other new complaints.  He says he feels tired and weak  Objective: Vital signs in last 24 hours: Temp:  [97.5 F (36.4 C)-98.2 F (36.8 C)] 97.5 F (36.4 C) (04/18 0400) Pulse Rate:  [67-99] 79 (04/18 0600) Resp:  [7-19] 13 (04/18 0600) BP: (94-124)/(50-67) 113/58 (04/18 0600) SpO2:  [94 %-98 %] 96 % (04/18 0600) Weight change:     Intake/Output from previous day: 04/17 0701 - 04/18 0700 In: 1151.2 [I.V.:751.2; IV Piggyback:400] Out: -   PHYSICAL EXAM General appearance: alert and Confused Resp: rhonchi bilaterally Cardio: regular rate and rhythm, S1, S2 normal, no murmur, click, rub or gallop GI: soft, non-tender; bowel sounds normal; no masses,  no organomegaly Extremities: extremities normal, atraumatic, no cyanosis or edema  Lab Results:  Results for orders placed or performed during the hospital encounter of 10/08/18 (from the past 48 hour(s))  Osmolality     Status: Abnormal   Collection Time: 10/08/18  9:47 AM  Result Value Ref Range   Osmolality 236 (LL) 275 - 295 mOsm/kg    Comment: REPEATED TO VERIFY CRITICAL RESULT CALLED TO, READ BACK BY AND VERIFIED WITH: ELIZABETH MURPHY,RN AT 3825 10/08/2018 BY ZBEECH. Performed at [facility], [place] [address] [place], [place] [address]   CBC with Differential     Status: Abnormal   Collection Time: 10/08/18  9:48 AM  Result Value Ref Range   WBC 11.0 (H) 4.0 - 10.5 K/uL   RBC 3.66 (L) 4.22 - 5.81 MIL/uL   Hemoglobin 12.0 (L) 13.0 - 17.0 g/dL   HCT 32.3 (L) 39.0 - 52.0 %   MCV 88.3 80.0 - 100.0 fL   MCH 32.8 26.0 - 34.0 pg   MCHC 37.2 (H) 30.0 - 36.0 g/dL   RDW 11.3 (L) 11.5 - 15.5 %   Platelets 183 150 - 400 K/uL   nRBC 0.0 0.0 - 0.2 %   Neutrophils  Relative % 88 %   Neutro Abs 9.7 (H) 1.7 - 7.7 K/uL   Lymphocytes Relative 4 %   Lymphs Abs 0.4 (L) 0.7 - 4.0 K/uL   Monocytes Relative 7 %   Monocytes Absolute 0.8 0.1 - 1.0 K/uL   Eosinophils Relative 0 %   Eosinophils Absolute 0.0 0.0 - 0.5 K/uL   Basophils Relative 0 %   Basophils Absolute 0.0 0.0 - 0.1 K/uL   WBC Morphology TOXIC GRANULATION    Immature Granulocytes 1 %   Abs Immature Granulocytes 0.07 0.00 - 0.07 K/uL    Comment: Performed at [facility], [address], [place], [place] [address]  Basic metabolic panel     Status: Abnormal   Collection Time: 10/08/18  9:48 AM  Result Value Ref Range   Sodium 113 (LL) 135 - 145 mmol/L    Comment: CRITICAL RESULT CALLED TO, READ BACK BY AND VERIFIED WITH: CARTWELL,L'@1019'$  BY MATTHEWS, B 4.16.20    Potassium 3.4 (L) 3.5 - 5.1 mmol/L   Chloride 73 (L) 98 - 111 mmol/L   CO2 28 22 - 32 mmol/L   Glucose, Bld 113 (H) 70 - 99 mg/dL   BUN 12 6 - 20 mg/dL   Creatinine, Ser 0.57 (L) 0.61 -  1.24 mg/dL   Calcium 8.9 8.9 - 10.3 mg/dL   GFR calc non Af Amer >60 >60 mL/min   GFR calc Af Amer >60 >60 mL/min   Anion gap 12 5 - 15    Comment: Performed at [facility], [address], [place], [place] [address]  Ethanol     Status: None   Collection Time: 10/08/18  9:48 AM  Result Value Ref Range   Alcohol, Ethyl (B) <10 <10 mg/dL    Comment: (NOTE) Lowest detectable limit for serum alcohol is 10 mg/dL. For medical purposes only. Performed at [facility], [address], [place], [place] [address]   Sodium     Status: Abnormal   Collection Time: 10/08/18  9:48 AM  Result Value Ref Range   Sodium 115 (LL) 135 - 145 mmol/L    Comment: CRITICAL RESULT CALLED TO, READ BACK BY AND VERIFIED WITH: WHITE,M AT 1132 BY HUFFINES,S ON 10/08/18. Performed at [facility], [address], [place], [place] [address]   Urine rapid drug screen (hosp performed)     Status: None   Collection Time: 10/08/18 10:34 AM  Result Value Ref Range   Opiates  NONE DETECTED NONE DETECTED   Cocaine NONE DETECTED NONE DETECTED   Benzodiazepines NONE DETECTED NONE DETECTED   Amphetamines NONE DETECTED NONE DETECTED   Tetrahydrocannabinol NONE DETECTED NONE DETECTED   Barbiturates NONE DETECTED NONE DETECTED    Comment: (NOTE) DRUG SCREEN FOR MEDICAL PURPOSES ONLY.  IF CONFIRMATION IS NEEDED FOR ANY PURPOSE, NOTIFY LAB WITHIN 5 DAYS. LOWEST DETECTABLE LIMITS FOR URINE DRUG SCREEN Drug Class                     Cutoff (ng/mL) Amphetamine and metabolites    1000 Barbiturate and metabolites    200 Benzodiazepine                 165 Tricyclics and metabolites     300 Opiates and metabolites        300 Cocaine and metabolites        300 THC                            50 Performed at [place] [place], [place] [address]   Urinalysis, Routine w reflex microscopic     Status: None   Collection Time: 10/08/18 10:34 AM  Result Value Ref Range   Color, Urine YELLOW YELLOW   APPearance CLEAR CLEAR   Specific Gravity, Urine 1.005 1.005 - 1.030   pH 7.0 5.0 - 8.0   Glucose, UA NEGATIVE NEGATIVE mg/dL   Hgb urine dipstick NEGATIVE NEGATIVE   Bilirubin Urine NEGATIVE NEGATIVE   Ketones, ur NEGATIVE NEGATIVE mg/dL   Protein, ur NEGATIVE NEGATIVE mg/dL   Nitrite NEGATIVE NEGATIVE   Leukocytes,Ua NEGATIVE NEGATIVE    Comment: Performed at [facility], [address], [place], [place] [address]  Lithium level     Status: Abnormal   Collection Time: 10/08/18 10:40 AM  Result Value Ref Range   Lithium Lvl <0.06 (L) 0.60 - 1.20 mmol/L    Comment: Performed at [facility], [address] [place], [place] [address]  Osmolality, urine     Status: Abnormal   Collection Time: 10/08/18 10:40 AM  Result Value Ref Range   Osmolality, Ur 173 (L) 300 - 900 mOsm/kg    Comment: Performed at [facility]  [address], [place], [place] [address]  Sodium, urine, random     Status: None   Collection Time: 10/08/18 10:40 AM  Result Value  Ref Range   Sodium, Ur <10 mmol/L    Comment: Performed at [facility], [address], [place], [place] [address]  TSH     Status: Abnormal   Collection Time: 10/08/18 10:41 AM  Result Value Ref Range   TSH 9.001 (H) 0.350 - 4.500 uIU/mL    Comment: Performed by a 3rd Generation assay with a functional sensitivity of <=0.01 uIU/mL. Performed at [facility], [address], [place], [place] [address]   Sodium     Status: Abnormal   Collection Time: 10/08/18  1:14 PM  Result Value Ref Range   Sodium 117 (LL) 135 - 145 mmol/L    Comment: CRITICAL RESULT CALLED TO, READ BACK BY AND VERIFIED WITH: WILLEY,E AT 1401 ON 4.16.20 BY ISLEY,B Performed at [facility], [address], [place], [place] [address]   Sodium     Status: Abnormal   Collection Time: 10/08/18  5:04 PM  Result Value Ref Range   Sodium 122 (L) 135 - 145 mmol/L    Comment: Performed at [facility], [address] [place], [place] [address]  HIV antibody (Routine Testing)     Status: None   Collection Time: 10/08/18  5:04 PM  Result Value Ref Range   HIV Screen 4th Generation wRfx Non Reactive Non Reactive    Comment: (NOTE) Performed At: [facility] [place], [place] [id number] Rush Farmer MD [id number]   MRSA PCR Screening     Status: None   Collection Time: 10/08/18  9:00 PM  Result Value Ref Range   MRSA by PCR NEGATIVE NEGATIVE    Comment:        The GeneXpert MRSA Assay (FDA approved for NASAL specimens only), is one component of a comprehensive MRSA colonization surveillance program. It is not intended to diagnose MRSA infection nor to guide or monitor treatment for MRSA infections. Performed at [facility], [address], [place], [place] [address]   Basic metabolic panel     Status: Abnormal   Collection Time: 10/09/18  4:14 AM  Result Value Ref Range   Sodium 130 (L) 135 - 145 mmol/L    Comment: DELTA CHECK NOTED   Potassium 3.1 (L) 3.5 - 5.1 mmol/L   Chloride 96  (L) 98 - 111 mmol/L   CO2 26 22 - 32 mmol/L   Glucose, Bld 98 70 - 99 mg/dL   BUN 7 6 - 20 mg/dL   Creatinine, Ser 0.44 (L) 0.61 - 1.24 mg/dL   Calcium 8.2 (L) 8.9 - 10.3 mg/dL   GFR calc non Af Amer >60 >60 mL/min   GFR calc Af Amer >60 >60 mL/min   Anion gap 8 5 - 15    Comment: Performed at [facility], [address], [place], [place] [address]  CBC     Status: Abnormal   Collection Time: 10/09/18  4:14 AM  Result Value Ref Range   WBC 7.9 4.0 - 10.5 K/uL    Comment: WHITE COUNT CONFIRMED ON SMEAR   RBC 3.27 (L) 4.22 - 5.81 MIL/uL   Hemoglobin 10.6 (L) 13.0 - 17.0 g/dL   HCT 30.0 (L) 39.0 - 52.0 %   MCV 91.7 80.0 - 100.0 fL   MCH 32.4 26.0 - 34.0 pg   MCHC 35.3 30.0 - 36.0 g/dL   RDW 11.9 11.5 - 15.5 %  Platelets 173 150 - 400 K/uL   nRBC 0.0 0.0 - 0.2 %    Comment: Performed at [facility], [address], [place], [place] [address]  Ammonia     Status: None   Collection Time: 10/09/18  8:59 AM  Result Value Ref Range   Ammonia 28 9 - 35 umol/L    Comment: Performed at [facility], [address] [place], [place] [address]  Basic metabolic panel     Status: Abnormal   Collection Time: 10/10/18  5:21 AM  Result Value Ref Range   Sodium 137 135 - 145 mmol/L    Comment: DELTA CHECK NOTED   Potassium 3.6 3.5 - 5.1 mmol/L   Chloride 105 98 - 111 mmol/L   CO2 25 22 - 32 mmol/L   Glucose, Bld 90 70 - 99 mg/dL   BUN 7 6 - 20 mg/dL   Creatinine, Ser 0.48 (L) 0.61 - 1.24 mg/dL   Calcium 8.4 (L) 8.9 - 10.3 mg/dL   GFR calc non Af Amer >60 >60 mL/min   GFR calc Af Amer >60 >60 mL/min   Anion gap 7 5 - 15    Comment: Performed at [facility], [address] [place], [place] [address]    ABGS No results for input(s): PHART, PO2ART, TCO2, HCO3 in the last 72 hours.  Invalid input(s): PCO2 CULTURES Recent Results (from the past 240 hour(s))  MRSA PCR Screening     Status: None   Collection Time: 10/08/18  9:00 PM  Result Value Ref Range Status   MRSA by PCR NEGATIVE  NEGATIVE Final    Comment:        The GeneXpert MRSA Assay (FDA approved for NASAL specimens only), is one component of a comprehensive MRSA colonization surveillance program. It is not intended to diagnose MRSA infection nor to guide or monitor treatment for MRSA infections. Performed at [facility], [address], [place], [place] [address]    Studies/Results: Dg Chest 1 View  Result Date: 10/08/2018 CLINICAL DATA:  ETOH, fall, chest pain EXAM: CHEST  1 VIEW COMPARISON:  12/16/2017 FINDINGS: The heart size and mediastinal contours are within normal limits. Both lungs are clear. The visualized skeletal structures are unremarkable. IMPRESSION: No acute abnormality of the lungs in AP projection. Electronically Signed   By: Eddie Candle M.D.   On: 10/08/2018 10:29   Ct Head Wo Contrast  Result Date: 10/08/2018 CLINICAL DATA:  Fall. EXAM: CT HEAD WITHOUT CONTRAST CT CERVICAL SPINE WITHOUT CONTRAST TECHNIQUE: Multidetector CT imaging of the head and cervical spine was performed following the standard protocol without intravenous contrast. Multiplanar CT image reconstructions of the cervical spine were also generated. COMPARISON:  CT head and cervical spine dated December 16, 2017. FINDINGS: CT HEAD FINDINGS Brain: No evidence of acute infarction, hemorrhage, hydrocephalus, extra-axial collection or mass lesion/mass effect. Stable mild atrophy slightly asymmetric to the left hemisphere. Stable mild chronic microvascular ischemic changes. Vascular: Atherosclerotic vascular calcification of the carotid siphons. No hyperdense vessel. Skull: Normal. Negative for fracture or focal lesion. Sinuses/Orbits: Mild bilateral paranasal sinus mucosal thickening, improved when compared to prior study. No air-fluid levels. The orbits are unremarkable. Other: High right-sided scalp laceration at the vertex status post stapling. CT CERVICAL SPINE FINDINGS Alignment: Normal. Skull base and vertebrae: No acute fracture.  No primary bone lesion or focal pathologic process. Soft tissues and spinal canal: No prevertebral fluid or swelling. No visible canal hematoma. Disc levels: Unchanged mild disc height loss at C5-C6 and  C6-C7. Unchanged mild-to-moderate right facet arthropathy at C2-C3. Upper chest: Unchanged emphysema. Other: None. IMPRESSION: 1. No acute intracranial abnormality. High right-sided scalp laceration at the vertex status post stapling. 2.  No acute cervical spine fracture. 3.  Emphysema (ICD10-J43.9). Electronically Signed   By: Titus Dubin M.D.   On: 10/08/2018 11:04   Ct Cervical Spine Wo Contrast  Result Date: 10/08/2018 CLINICAL DATA:  Fall. EXAM: CT HEAD WITHOUT CONTRAST CT CERVICAL SPINE WITHOUT CONTRAST TECHNIQUE: Multidetector CT imaging of the head and cervical spine was performed following the standard protocol without intravenous contrast. Multiplanar CT image reconstructions of the cervical spine were also generated. COMPARISON:  CT head and cervical spine dated December 16, 2017. FINDINGS: CT HEAD FINDINGS Brain: No evidence of acute infarction, hemorrhage, hydrocephalus, extra-axial collection or mass lesion/mass effect. Stable mild atrophy slightly asymmetric to the left hemisphere. Stable mild chronic microvascular ischemic changes. Vascular: Atherosclerotic vascular calcification of the carotid siphons. No hyperdense vessel. Skull: Normal. Negative for fracture or focal lesion. Sinuses/Orbits: Mild bilateral paranasal sinus mucosal thickening, improved when compared to prior study. No air-fluid levels. The orbits are unremarkable. Other: High right-sided scalp laceration at the vertex status post stapling. CT CERVICAL SPINE FINDINGS Alignment: Normal. Skull base and vertebrae: No acute fracture. No primary bone lesion or focal pathologic process. Soft tissues and spinal canal: No prevertebral fluid or swelling. No visible canal hematoma. Disc levels: Unchanged mild disc height loss at C5-C6 and  C6-C7. Unchanged mild-to-moderate right facet arthropathy at C2-C3. Upper chest: Unchanged emphysema. Other: None. IMPRESSION: 1. No acute intracranial abnormality. High right-sided scalp laceration at the vertex status post stapling. 2.  No acute cervical spine fracture. 3.  Emphysema (ICD10-J43.9). Electronically Signed   By: Titus Dubin M.D.   On: 10/08/2018 11:04    Medications:  Prior to Admission:  Medications Prior to Admission  Medication Sig Dispense Refill Last Dose  . Buprenorphine HCl-Naloxone HCl (SUBOXONE) 4-1 MG FILM Place 2 Film under the tongue See admin instructions. Dissolve 2 films under the tongue twice daily - May use one additional strip on 10 bad days a month   unknown  . clonazePAM (KLONOPIN) 0.5 MG tablet Take 0.5 mg by mouth daily as needed.   unknown  . lamoTRIgine (LAMICTAL) 100 MG tablet Take 1 tablet (100 mg total) by mouth 2 (two) times daily. 60 tablet 0 unknown  . levothyroxine (SYNTHROID, LEVOTHROID) 88 MCG tablet Take 1 tablet (88 mcg total) by mouth daily. 30 tablet 0 unknown  . lithium carbonate 300 MG capsule Take 1 capsule (300 mg total) by mouth 2 (two) times daily with a meal. 60 capsule 0 unknown  . NARCAN 4 MG/0.1ML LIQD nasal spray kit Place 4 mg into the nose See admin instructions. Administer a single spray of narcan in one nostril, repeat every 3 minutes as needed if no or minimal response  5 unknown  . OXYGEN Inhale 2.5 L into the lungs daily as needed (for breathing).    unknown  . pantoprazole (PROTONIX) 40 MG tablet TAKE 1 TABLET BY MOUTH TWICE DAILY BEFORE MEALS 60 tablet 3 unknown  . sertraline (ZOLOFT) 100 MG tablet Take 100 mg by mouth daily.   unknown   Scheduled: . enoxaparin (LOVENOX) injection  40 mg Subcutaneous Q24H  . lamoTRIgine  100 mg Oral BID  . levothyroxine  88 mcg Oral Daily  . nicotine  21 mg Transdermal Daily  . pantoprazole  40 mg Oral BID AC   Continuous: . 0.9 %  NaCl with KCl 20 mEq / L 50 mL/hr at 10/09/18 1847    LTJ:[id number] **OR** acetaminophen, LORazepam, ondansetron **OR** ondansetron (ZOFRAN) IV  Assesment: He was admitted with profound hyponatremia likely related to beer Potomania.  He was hypokalemic and that is better.  His sodium level is better.  He is overall improved and much more alert  He has alcoholism and is being treated for alcohol withdrawal.  He has COPD with significant smoking history.  He has bipolar disease and I think he is still following with psychiatry.  He has had multiple injuries from falling while he was intoxicated and had another fall with a laceration of his scalp  QT interval was prolonged likely related to his low sodium and low potassium.  He had urinary retention requiring in and out cath Active Problems:   Cirrhosis ([place])   Hepatitis C   Hypothyroid   COPD (chronic obstructive pulmonary disease) ([place])   Fall   Bipolar disorder ([place])   H/O ETOH abuse   Hyponatremia   Prolonged QT interval    Plan: Remain in stepdown today.  Continue alcohol withdrawal protocol.  Recheck electrolytes tomorrow.    LOS: 2 days   [name] 10/10/2018, 9:21 AM

## 2018-10-10 NOTE — Progress Notes (Signed)
Pt had no output throughout the night. Bladder scanner showed greater than 200cc. MD was notified, I&O order was placed. Total output after I&O cath was 1225. Pt is not resting. Will continue to monitor patient.

## 2018-10-11 LAB — BASIC METABOLIC PANEL
Anion gap: 5 (ref 5–15)
BUN: 7 mg/dL (ref 6–20)
CO2: 27 mmol/L (ref 22–32)
Calcium: 8.4 mg/dL — ABNORMAL LOW (ref 8.9–10.3)
Chloride: 105 mmol/L (ref 98–111)
Creatinine, Ser: 0.63 mg/dL (ref 0.61–1.24)
GFR calc Af Amer: >60 mL/min (ref >60)
GFR calc non Af Amer: >60 mL/min (ref >60)
Glucose, Bld: 106 mg/dL — ABNORMAL HIGH (ref 70–99)
Potassium: 3.5 mmol/L (ref 3.5–5.1)
Sodium: 137 mmol/L (ref 135–145)

## 2018-10-11 MED ORDER — POTASSIUM CHLORIDE CRYS ER 20 MEQ PO TBCR
20.0000 meq | EXTENDED_RELEASE_TABLET | Freq: Two times a day (BID) | ORAL | Status: DC
  Administered 2018-10-11 – 2018-10-16 (×11): 20 meq via ORAL
  Filled 2018-10-11 (×12): qty 1

## 2018-10-11 NOTE — Progress Notes (Signed)
Subjective: He seems to be doing a little bit better.  He is still anxious and still having some symptoms of withdrawal but is not actively pulling at equipment or trying to get out of bed.  He ate his breakfast this morning.  He did have to have a Foley catheter placed for urinary retention.  Objective: Vital signs in last 24 hours: Temp:  [97.2 F (36.2 C)-98.5 F (36.9 C)] 97.5 F (36.4 C) (04/19 0754) Pulse Rate:  [77-107] 77 (04/19 0600) Resp:  [9-27] 9 (04/19 0600) BP: (98-133)/(51-89) 106/60 (04/19 0600) SpO2:  [94 %-100 %] 98 % (04/19 0600) Weight:  [57.2 kg] 57.2 kg (04/19 0500) Weight change:     Intake/Output from previous day: 04/18 0701 - 04/19 0700 In: 2334.4 [P.O.:360; I.V.:1974.4] Out: 1150 [Urine:1150]  PHYSICAL EXAM General appearance: alert and Confused Resp: rhonchi bilaterally Cardio: regular rate and rhythm, S1, S2 normal, no murmur, click, rub or gallop GI: soft, non-tender; bowel sounds normal; no masses,  no organomegaly Extremities: extremities normal, atraumatic, no cyanosis or edema He is very thin Lab Results:  Results for orders placed or performed during the hospital encounter of 10/08/18 (from the past 48 hour(s))  Ammonia     Status: None   Collection Time: 10/09/18  8:59 AM  Result Value Ref Range   Ammonia 28 9 - 35 umol/L    Comment: Performed at [facility], [address], [place], [place] [address]  Basic metabolic panel     Status: Abnormal   Collection Time: 10/10/18  5:21 AM  Result Value Ref Range   Sodium 137 135 - 145 mmol/L    Comment: DELTA CHECK NOTED   Potassium 3.6 3.5 - 5.1 mmol/L   Chloride 105 98 - 111 mmol/L   CO2 25 22 - 32 mmol/L   Glucose, Bld 90 70 - 99 mg/dL   BUN 7 6 - 20 mg/dL   Creatinine, Ser 0.48 (L) 0.61 - 1.24 mg/dL   Calcium 8.4 (L) 8.9 - 10.3 mg/dL   GFR calc non Af Amer >60 >60 mL/min   GFR calc Af Amer >60 >60 mL/min   Anion gap 7 5 - 15    Comment: Performed at [facility], [address],  [place], [place] [address]  Basic metabolic panel     Status: Abnormal   Collection Time: 10/11/18  4:46 AM  Result Value Ref Range   Sodium 137 135 - 145 mmol/L   Potassium 3.5 3.5 - 5.1 mmol/L   Chloride 105 98 - 111 mmol/L   CO2 27 22 - 32 mmol/L   Glucose, Bld 106 (H) 70 - 99 mg/dL   BUN 7 6 - 20 mg/dL   Creatinine, Ser 0.63 0.61 - 1.24 mg/dL   Calcium 8.4 (L) 8.9 - 10.3 mg/dL   GFR calc non Af Amer >60 >60 mL/min   GFR calc Af Amer >60 >60 mL/min   Anion gap 5 5 - 15    Comment: Performed at [facility], [address], [place], [place] [address]    ABGS No results for input(s): PHART, PO2ART, TCO2, HCO3 in the last 72 hours.  Invalid input(s): PCO2 CULTURES Recent Results (from the past 240 hour(s))  MRSA PCR Screening     Status: None   Collection Time: 10/08/18  9:00 PM  Result Value Ref Range Status   MRSA by PCR NEGATIVE NEGATIVE Final    Comment:        The GeneXpert MRSA Assay (FDA approved  for NASAL specimens only), is one component of a comprehensive MRSA colonization surveillance program. It is not intended to diagnose MRSA infection nor to guide or monitor treatment for MRSA infections. Performed at [facility], [address], [place], [place] [address]    Studies/Results: No results found.  Medications:  Prior to Admission:  Medications Prior to Admission  Medication Sig Dispense Refill Last Dose  . Buprenorphine HCl-Naloxone HCl (SUBOXONE) 4-1 MG FILM Place 2 Film under the tongue See admin instructions. Dissolve 2 films under the tongue twice daily - May use one additional strip on 10 bad days a month   unknown  . clonazePAM (KLONOPIN) 0.5 MG tablet Take 0.5 mg by mouth daily as needed.   unknown  . lamoTRIgine (LAMICTAL) 100 MG tablet Take 1 tablet (100 mg total) by mouth 2 (two) times daily. 60 tablet 0 unknown  . levothyroxine (SYNTHROID, LEVOTHROID) 88 MCG tablet Take 1 tablet (88 mcg total) by mouth daily. 30 tablet 0 unknown  . lithium carbonate  300 MG capsule Take 1 capsule (300 mg total) by mouth 2 (two) times daily with a meal. 60 capsule 0 unknown  . NARCAN 4 MG/0.1ML LIQD nasal spray kit Place 4 mg into the nose See admin instructions. Administer a single spray of narcan in one nostril, repeat every 3 minutes as needed if no or minimal response  5 unknown  . OXYGEN Inhale 2.5 L into the lungs daily as needed (for breathing).    unknown  . pantoprazole (PROTONIX) 40 MG tablet TAKE 1 TABLET BY MOUTH TWICE DAILY BEFORE MEALS 60 tablet 3 unknown  . sertraline (ZOLOFT) 100 MG tablet Take 100 mg by mouth daily.   unknown   Scheduled: . enoxaparin (LOVENOX) injection  40 mg Subcutaneous Q24H  . lamoTRIgine  100 mg Oral BID  . levothyroxine  88 mcg Oral Daily  . nicotine  21 mg Transdermal Daily  . pantoprazole  40 mg Oral BID AC   Continuous: . 0.9 % NaCl with KCl 20 mEq / L 50 mL/hr at 10/11/18 0849   ADG:[id number] **OR** acetaminophen, LORazepam, ondansetron **OR** ondansetron (ZOFRAN) IV  Assesment: He was admitted with hyponatremia from beer Poto mania he is much better.  He was hypokalemic but his potassium this morning is adequate at 3.5.  He is still receiving potassium in his IV fluids.  He has alcoholism and is currently having alcohol withdrawal.  He has bipolar disease which is stable  He has cirrhosis from his alcoholism  He has prolonged QT interval which is improved with potassium replacement Active Problems:   Cirrhosis ([place])   Hepatitis C   Hypothyroid   COPD (chronic obstructive pulmonary disease) ([place])   Fall   Bipolar disorder ([place])   H/O ETOH abuse   Hyponatremia   Prolonged QT interval    Plan: Continue treatments.  I think he is okay to move from stepdown.  PT consult because I think he is going to have to be placed.  Recheck potassium in the morning.  Decrease IV fluids to saline lock.  Oral potassium replacement    LOS: 3 days   Alonza Bogus 10/11/2018, 8:57 AM

## 2018-10-12 LAB — COMPREHENSIVE METABOLIC PANEL
ALT: 20 U/L (ref 0–44)
AST: 21 U/L (ref 15–41)
Albumin: 3.2 g/dL — ABNORMAL LOW (ref 3.5–5.0)
Alkaline Phosphatase: 79 U/L (ref 38–126)
Anion gap: 7 (ref 5–15)
BUN: 8 mg/dL (ref 6–20)
CO2: 25 mmol/L (ref 22–32)
Calcium: 8.4 mg/dL — ABNORMAL LOW (ref 8.9–10.3)
Chloride: 104 mmol/L (ref 98–111)
Creatinine, Ser: 0.62 mg/dL (ref 0.61–1.24)
GFR calc Af Amer: >60 mL/min (ref >60)
GFR calc non Af Amer: >60 mL/min (ref >60)
Glucose, Bld: 88 mg/dL (ref 70–99)
Potassium: 4.2 mmol/L (ref 3.5–5.1)
Sodium: 136 mmol/L (ref 135–145)
Total Bilirubin: 0.6 mg/dL (ref 0.3–1.2)
Total Protein: 6 g/dL — ABNORMAL LOW (ref 6.5–8.1)

## 2018-10-12 MED ORDER — IBUPROFEN 400 MG PO TABS
400.0000 mg | ORAL_TABLET | Freq: Four times a day (QID) | ORAL | Status: DC | PRN
  Administered 2018-10-12 – 2018-10-16 (×13): 400 mg via ORAL
  Filled 2018-10-12 (×13): qty 1

## 2018-10-12 NOTE — Plan of Care (Signed)
  Problem: Acute Rehab PT Goals(only PT should resolve) Goal: Pt Will Go Supine/Side To Sit Outcome: Progressing Flowsheets (Taken 10/12/2018 1400) Pt will go Supine/Side to Sit: Independently Goal: Patient Will Transfer Sit To/From Stand Outcome: Progressing Flowsheets (Taken 10/12/2018 1400) Patient will transfer sit to/from stand: with supervision Goal: Pt Will Transfer Bed To Chair/Chair To Bed Outcome: Progressing Goal: Pt Will Ambulate Outcome: Progressing Flowsheets (Taken 10/12/2018 1400) Pt will Ambulate: > 125 feet; with supervision; with rolling walker   2:01 PM, 10/12/18 Lonell Grandchild, MPT Physical Therapist with [facility] [phone] office [phone] mobile phone

## 2018-10-12 NOTE — Progress Notes (Signed)
Subjective: He says he feels better.  He is complaining of a headache.  He does not seem very tremulous.  He is very weak.  Objective: Vital signs in last 24 hours: Temp:  [98.5 F (36.9 C)-99.3 F (37.4 C)] 98.5 F (36.9 C) (04/20 0762) Pulse Rate:  [78-108] 84 (04/20 0638) Resp:  [12-22] 16 (04/20 2633) BP: (109-140)/(56-82) 123/71 (04/20 3545) SpO2:  [97 %-100 %] 97 % (04/20 0739) Weight change:     Intake/Output from previous day: 04/19 0701 - 04/20 0700 In: -  Out: 1600 [Urine:1600]  PHYSICAL EXAM General appearance: alert, cooperative and no distress Resp: rhonchi bilaterally Cardio: regular rate and rhythm, S1, S2 normal, no murmur, click, rub or gallop GI: soft, non-tender; bowel sounds normal; no masses,  no organomegaly Extremities: extremities normal, atraumatic, no cyanosis or edema  Lab Results:  Results for orders placed or performed during the hospital encounter of 10/08/18 (from the past 48 hour(s))  Basic metabolic panel     Status: Abnormal   Collection Time: 10/11/18  4:46 AM  Result Value Ref Range   Sodium 137 135 - 145 mmol/L   Potassium 3.5 3.5 - 5.1 mmol/L   Chloride 105 98 - 111 mmol/L   CO2 27 22 - 32 mmol/L   Glucose, Bld 106 (H) 70 - 99 mg/dL   BUN 7 6 - 20 mg/dL   Creatinine, Ser 0.63 0.61 - 1.24 mg/dL   Calcium 8.4 (L) 8.9 - 10.3 mg/dL   GFR calc non Af Amer >60 >60 mL/min   GFR calc Af Amer >60 >60 mL/min   Anion gap 5 5 - 15    Comment: Performed at [facility], [address] [place], [place] [address]  Comprehensive metabolic panel     Status: Abnormal   Collection Time: 10/12/18  6:02 AM  Result Value Ref Range   Sodium 136 135 - 145 mmol/L   Potassium 4.2 3.5 - 5.1 mmol/L   Chloride 104 98 - 111 mmol/L   CO2 25 22 - 32 mmol/L   Glucose, Bld 88 70 - 99 mg/dL   BUN 8 6 - 20 mg/dL   Creatinine, Ser 0.62 0.61 - 1.24 mg/dL   Calcium 8.4 (L) 8.9 - 10.3 mg/dL   Total Protein 6.0 (L) 6.5 - 8.1 g/dL   Albumin 3.2 (L) 3.5 - 5.0 g/dL    AST 21 15 - 41 U/L   ALT 20 0 - 44 U/L   Alkaline Phosphatase 79 38 - 126 U/L   Total Bilirubin 0.6 0.3 - 1.2 mg/dL   GFR calc non Af Amer >60 >60 mL/min   GFR calc Af Amer >60 >60 mL/min   Anion gap 7 5 - 15    Comment: Performed at [facility], [address], [place], [place] [address]    ABGS No results for input(s): PHART, PO2ART, TCO2, HCO3 in the last 72 hours.  Invalid input(s): PCO2 CULTURES Recent Results (from the past 240 hour(s))  MRSA PCR Screening     Status: None   Collection Time: 10/08/18  9:00 PM  Result Value Ref Range Status   MRSA by PCR NEGATIVE NEGATIVE Final    Comment:        The GeneXpert MRSA Assay (FDA approved for NASAL specimens only), is one component of a comprehensive MRSA colonization surveillance program. It is not intended to diagnose MRSA infection nor to guide or monitor treatment for MRSA infections. Performed at [facility], [address] [place],  [place] [address]    Studies/Results: No results found.  Medications:  Prior to Admission:  Medications Prior to Admission  Medication Sig Dispense Refill Last Dose  . Buprenorphine HCl-Naloxone HCl (SUBOXONE) 4-1 MG FILM Place 2 Film under the tongue See admin instructions. Dissolve 2 films under the tongue twice daily - May use one additional strip on 10 bad days a month   unknown  . clonazePAM (KLONOPIN) 0.5 MG tablet Take 0.5 mg by mouth daily as needed.   unknown  . lamoTRIgine (LAMICTAL) 100 MG tablet Take 1 tablet (100 mg total) by mouth 2 (two) times daily. 60 tablet 0 unknown  . levothyroxine (SYNTHROID, LEVOTHROID) 88 MCG tablet Take 1 tablet (88 mcg total) by mouth daily. 30 tablet 0 unknown  . lithium carbonate 300 MG capsule Take 1 capsule (300 mg total) by mouth 2 (two) times daily with a meal. 60 capsule 0 unknown  . NARCAN 4 MG/0.1ML LIQD nasal spray kit Place 4 mg into the nose See admin instructions. Administer a single spray of narcan in one nostril, repeat every 3  minutes as needed if no or minimal response  5 unknown  . OXYGEN Inhale 2.5 L into the lungs daily as needed (for breathing).    unknown  . pantoprazole (PROTONIX) 40 MG tablet TAKE 1 TABLET BY MOUTH TWICE DAILY BEFORE MEALS 60 tablet 3 unknown  . sertraline (ZOLOFT) 100 MG tablet Take 100 mg by mouth daily.   unknown   Scheduled: . enoxaparin (LOVENOX) injection  40 mg Subcutaneous Q24H  . lamoTRIgine  100 mg Oral BID  . levothyroxine  88 mcg Oral Daily  . nicotine  21 mg Transdermal Daily  . pantoprazole  40 mg Oral BID AC  . potassium chloride  20 mEq Oral BID   Continuous:  XTG:GYIRSWNIOEVOJ **OR** acetaminophen, LORazepam, ondansetron **OR** ondansetron (ZOFRAN) IV  Assesment: He was admitted after a fall and has a laceration of his scalp.  It is too early to remove the staples.  He has alcoholism with ongoing alcohol abuse and had withdrawal symptoms.  He has been on protocol and seems to be doing better.  He has COPD had ongoing smoking but he is not complaining of shortness of breath.  He was severely hyponatremic related to beer [place]oto mania and that has improved and his sodium is 136 today  He has hypokalemia and that is better potassium today is 4.2  He has cirrhosis of the liver from alcoholism stable  He has bipolar disease and is supposed to be on lithium but his lithium level was nondetectable and I suspect he just was not taking his medications  He has hypothyroidism and his TSH was high on admission and again I think he just was not taking his thyroid replacement medicine  He is very weak.  Will obtain physical therapy consultation to see if he is a candidate for rehab  He has significant social challenges and is currently living in a motel  He had prolonged QT interval likely related to his low sodium and potassium.  This had improved Active Problems:   Cirrhosis ([place])   Hepatitis C   Hypothyroid   COPD (chronic obstructive pulmonary disease) ([place])   Fall    Bipolar disorder ([place])   H/O ETOH abuse   Hyponatremia   Prolonged QT interval    Plan: Continue treatments.  Recheck EKG to be sure his QT interval is still back to normal    LOS: 4 days   Percell Miller  L Jaleigha Deane 10/12/2018, 8:15 AM

## 2018-10-12 NOTE — Progress Notes (Signed)
EKG completed. Results given to Lattie Haw, RN and then placed on pt's chart

## 2018-10-12 NOTE — TOC Initial Note (Signed)
Transition of Care [facility]) - Initial/Assessment Note    Patient Details  Name: [name] MRN: [id number] Date of Birth: April 17, 1961  Transition of [facility]) CM/SW Contact:    Brendan Barge, RN Phone Number: 10/12/2018, 4:10 PM  Clinical Narrative:   CM referral for unsafe DC plan and HH. PT living in [place]. Room paid for up until 11AM 4/21. Has been being paid for by sister (lives in [place]). Pt independent pta. Abuses ETOH.   Lattie Haw, RN, received call from daughter with concerns of pt's safety at DC. Concerns relayed to this CM. CM in to see. When asked what his plan was for when his room was no longer paid for pt states 'he didn't know, didn't have a plan'. Pt has contacted his sister in [place] to see if he can come and stay with her vs get room paid for again. Pt makes $700/month. He has no money left for this month. He has a meeting with his "C/O" on Monday. CM asked if pt would be able to leave the state to live with his sister if he was on probation/parrolle (which on not specified). Pt believe he would be able to transfer to [facility] office with little difficulty. Pt feels safe returning "home" alone. PT recommends HH PT and feels pt would be okay to be home. Pt says his family in [place] is unable to provide financial assistance or a place for him to stay.   Pt has given permission for this CM to speak with daughter Joslyn Devon, about situation. CM has told pt it is preferable that he communicate with his family. CM will make sure plan is communicated to the daughter once determined.   Initial visit was at 1300. Sister was supposed to call back in next 15 minutes. CM called into pt room at 1530 and pt still had no spoken with sister. CM will follow up with patient tomorrow AM.                  Expected Discharge Plan: [place] Barriers to Discharge: Barriers Unresolved (comment)(pt unsure where he will DC to at this time. family concerned he is not safe)   Expected  Discharge Plan and Services Expected Discharge Plan: [place]       Living arrangements for the past 2 months: Hotel/Motel                   Prior Living Arrangements/Services Living arrangements for the past 2 months: Hotel/Motel Lives with:: Self Patient language and need for interpreter reviewed:: Yes Do you feel safe going back to the place where you live?: Yes      Need for Family Participation in Patient Care: No (Comment) Care giver support system in place?: Yes (comment)   Criminal Activity/Legal Involvement Pertinent to Current Situation/Hospitalization: No - Comment as needed  Activities of Daily Living Home Assistive Devices/Equipment: None ADL Screening (condition at time of admission) Patient's cognitive ability adequate to safely complete daily activities?: No Is the patient deaf or have difficulty hearing?: No Does the patient have difficulty seeing, even when wearing glasses/contacts?: No Does the patient have difficulty concentrating, remembering, or making decisions?: Yes Patient able to express need for assistance with ADLs?: Yes Does the patient have difficulty dressing or bathing?: No Independently performs ADLs?: Yes (appropriate for developmental age) Does the patient have difficulty walking or climbing stairs?: No Weakness of Legs: Both Weakness of Arms/Hands: Both  Permission  Sought/Granted Permission sought to share information with : Family Supports Permission granted to share information with : Yes, Verbal Permission Granted  Share Information with NAME: daughter Joslyn Devon           Emotional Assessment Appearance:: Appears older than stated age Attitude/Demeanor/Rapport: Other (comment)(appropriate) Affect (typically observed): Appropriate, Pleasant Orientation: : Oriented to Self, Oriented to Place, Oriented to  Time, Oriented to Situation Alcohol / Substance Use: Alcohol Use Psych Involvement: No (comment)  Admission  diagnosis:  Acute hyponatremia [E87.1] Chest pain [R07.9] Laceration of scalp, initial encounter [S01.01XA] Chest pain, unspecified type [R07.9] Patient Active Problem List   Diagnosis Date Noted  . Hyponatremia 10/08/2018  . Prolonged QT interval 10/08/2018  . History of colonic polyps 12/11/2017  . Inguinal hernia, right   . Colon polyposis 10/08/2017  . GI bleeding 03/21/2017  . Anemia   . GI bleed due to NSAIDs   . Melena   . 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 Failed total hip arthroplasty, subsequent encounter 10/25/2016  . Alcohol abuse with alcohol-induced disorder ([place]) 04/16/2016  . Acute narcotic withdrawal with delirium ([place]) 11/12/2014  . H/O ETOH abuse 11/10/2014  . Encephalopathy acute 11/10/2014  . Chronic pain 09/10/2012  . Bipolar disorder ([place]) 09/10/2012  . Open wound, L leg 09/10/2012  . Fall 09/01/2012  . Fracture of ribs, right 11 & 12 08/30/2012  . Closed fracture of transverse process of lumbar vertebra L2 & L3 08/30/2012  . Tibia/fibula fracture 08/30/2012  . Traumatic compartment syndrome of left leg 08/30/2012  . COPD (chronic obstructive pulmonary disease) ([place])   . Anxiety   . Hepatitis C 07/09/2012  . Hypothyroid 07/09/2012  . Constipation 03/17/2012  . Cirrhosis ([place]) 03/17/2012  . Chronic pancreatitis ([place]) 03/14/2008   PCP:  Sinda Du, MD Pharmacy:   [facility] [place] [address] [place] [address] Phone: [phone] Fax: [phone]     Social Determinants of Health (SDOH) Interventions    Readmission Risk Interventions No flowsheet data found.

## 2018-10-12 NOTE — Evaluation (Signed)
Physical Therapy Evaluation Patient Details Name: [name] MRN: [id number] DOB: [birth year] Today's Date: 10/12/2018   History of Present Illness  [name] is a 58 y.o. male with medical history significant of alcoholism, hepatitis C, cirrhosis, bipolar disorder, hypothyroidism, presents to the hospital after suffering a fall.  Patient complained of a laceration to his scalp.  He had slipped in the bathroom and fell, striking his head onto the railing.  At this time, patient has been medicated with Ativan and is unable to participate in history.  History is obtained per the medical record.  Patient admits to daily alcohol use.  He complains of mild chest discomfort to his anterior chest, but felt this was secondary to his fall.  He denies any loss of consciousness.  He has been staying at a [facility].    Clinical Impression  Patient functioning near baseline for functional mobility and gait, demonstrates slow unsteady cadence using SPC with tendency to lean on nearby objects for support, improved balance/endurance using RW and ambulated in hallway without loss of balance.  Patient requested to go back to bed secondary to c/o fatigue and lack of sleep.  Patient will benefit from continued physical therapy in hospital and recommended venue below to increase strength, balance, endurance for safe ADLs and gait.     Follow Up Recommendations Home health PT;Supervision - Intermittent    Equipment Recommendations       Recommendations for Other Services       Precautions / Restrictions Precautions Precautions: Fall Restrictions Weight Bearing Restrictions: No      Mobility  Bed Mobility Overal bed mobility: Modified Independent             General bed mobility comments: increased time  Transfers Overall transfer level: Needs assistance   Transfers: Sit to/from Stand;Stand Pivot Transfers Sit to Stand: Min guard Stand pivot transfers: Min guard       General  transfer comment: increased time  Ambulation/Gait Ambulation/Gait assistance: Supervision;Min guard Gait Distance (Feet): 80 Feet Assistive device: Rolling walker (2 wheeled) Gait Pattern/deviations: Decreased step length - right;Decreased step length - left;Decreased stride length Gait velocity: decreased   General Gait Details: slightly labored cadence without loss of balance, unsteady one feet using SPC with tendency to lean on near by objects for support, increased balance/endurance using RW  Stairs            Wheelchair Mobility    Modified Rankin (Stroke Patients Only)       Balance Overall balance assessment: Needs assistance Sitting-balance support: Feet supported;No upper extremity supported Sitting balance-Leahy Scale: Good     Standing balance support: Single extremity supported;During functional activity Standing balance-Leahy Scale: Fair Standing balance comment: fair/poor using SPC, fair using RW                             Pertinent Vitals/Pain Pain Assessment: 0-10 Pain Score: 9  Pain Location: right side of head at site of staples/laceration and bilateral shoulders Pain Descriptors / Indicators: Aching;Sore;Discomfort Pain Intervention(s): Limited activity within patient's tolerance;Monitored during session    Home Living Family/patient expects to be discharged to:: Private residence Living Arrangements: Parent;Other relatives Available Help at Discharge: Available PRN/intermittently Type of Home: House Home Access: Level entry     Home Layout: One level Home Equipment: Walker - 2 wheels;Wheelchair - manual;Shower seat;Bedside commode;Cane - single point      Prior Function Level of Independence: Independent with  assistive device(s)         Comments: 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 [facility]     Hand Dominance        Extremity/Trunk Assessment   Upper Extremity Assessment Upper Extremity Assessment:  Generalized weakness    Lower Extremity Assessment Lower Extremity Assessment: Generalized weakness    Cervical / Trunk Assessment Cervical / Trunk Assessment: Kyphotic  Communication   Communication: No difficulties  Cognition Arousal/Alertness: Awake/alert Behavior During Therapy: WFL for tasks assessed/performed Overall Cognitive Status: Within Functional Limits for tasks assessed                                        General Comments      Exercises     Assessment/Plan    PT Assessment Patient needs continued PT services  PT Problem List Decreased strength;Decreased activity tolerance;Decreased balance;Decreased mobility       PT Treatment Interventions Therapeutic exercise;Gait training;Stair training;Functional mobility training;Therapeutic activities;Patient/family education    PT Goals (Current goals can be found in the Care Plan section)  Acute Rehab PT Goals Patient Stated Goal: return home PT Goal Formulation: With patient Time For Goal Achievement: 10/10/18 Potential to Achieve Goals: Good    Frequency Min 3X/week   Barriers to discharge        Co-evaluation               AM-PAC PT "6 Clicks" Mobility  Outcome Measure Help needed turning from your back to your side while in a flat bed without using bedrails?: None Help needed moving from lying on your back to sitting on the side of a flat bed without using bedrails?: None Help needed moving to and from a bed to a chair (including a wheelchair)?: A Little Help needed standing up from a chair using your arms (e.g., wheelchair or bedside chair)?: A Little Help needed to walk in hospital room?: A Little Help needed climbing 3-5 steps with a railing? : A Lot 6 Click Score: 19    End of Session Equipment Utilized During Treatment: Gait belt Activity Tolerance: Patient tolerated treatment well;Patient limited by fatigue Patient left: in bed;with call bell/phone within reach;with  bed alarm set Nurse Communication: Mobility status PT Visit Diagnosis: Unsteadiness on feet (R26.81);Other abnormalities of gait and mobility (R26.89);Muscle weakness (generalized) (M62.81)    Time: 4388-8757 PT Time Calculation (min) (ACUTE ONLY): 25 min   Charges:   PT Evaluation $PT Eval Moderate Complexity: 1 Mod PT Treatments $Therapeutic Activity: 23-37 mins        1:59 PM, 10/12/18 Lonell Grandchild, MPT Physical Therapist with [facility] [phone] office [phone] mobile phone

## 2018-10-13 MED ORDER — TAMSULOSIN HCL 0.4 MG PO CAPS
0.4000 mg | ORAL_CAPSULE | Freq: Every day | ORAL | Status: DC
  Administered 2018-10-13 – 2018-10-16 (×4): 0.4 mg via ORAL
  Filled 2018-10-13 (×4): qty 1

## 2018-10-13 NOTE — Progress Notes (Signed)
Subjective: He says he feels fairly well.  He had PT evaluation and it was felt that he could have home health PT.  Case management is working with him because at this point it is not clear what the discharge plan is going to be.  He says his sister is trying to work on something for him to come to [place] to be with her.  He does not have any new complaints.  He still has a headache.  Objective: Vital signs in last 24 hours: Temp:  [97.9 F (36.6 C)-98.8 F (37.1 C)] 98.3 F (36.8 C) (04/21 0559) Pulse Rate:  [63-101] 90 (04/21 0559) Resp:  [16-18] 18 (04/21 0559) BP: (103-128)/(63-75) 103/63 (04/21 0559) SpO2:  [90 %-99 %] 98 % (04/21 0746) Weight change:     Intake/Output from previous day: 04/20 0701 - 04/21 0700 In: 960 [P.O.:960] Out: 2850 [Urine:2850]  PHYSICAL EXAM General appearance: alert, cooperative and no distress Resp: rhonchi bilaterally Cardio: regular rate and rhythm, S1, S2 normal, no murmur, click, rub or gallop GI: soft, non-tender; bowel sounds normal; no masses,  no organomegaly Extremities: extremities normal, atraumatic, no cyanosis or edema  Lab Results:  Results for orders placed or performed during the hospital encounter of 10/08/18 (from the past 48 hour(s))  Comprehensive metabolic panel     Status: Abnormal   Collection Time: 10/12/18  6:02 AM  Result Value Ref Range   Sodium 136 135 - 145 mmol/L   Potassium 4.2 3.5 - 5.1 mmol/L   Chloride 104 98 - 111 mmol/L   CO2 25 22 - 32 mmol/L   Glucose, Bld 88 70 - 99 mg/dL   BUN 8 6 - 20 mg/dL   Creatinine, Ser 0.62 0.61 - 1.24 mg/dL   Calcium 8.4 (L) 8.9 - 10.3 mg/dL   Total Protein 6.0 (L) 6.5 - 8.1 g/dL   Albumin 3.2 (L) 3.5 - 5.0 g/dL   AST 21 15 - 41 U/L   ALT 20 0 - 44 U/L   Alkaline Phosphatase 79 38 - 126 U/L   Total Bilirubin 0.6 0.3 - 1.2 mg/dL   GFR calc non Af Amer >60 >60 mL/min   GFR calc Af Amer >60 >60 mL/min   Anion gap 7 5 - 15    Comment: Performed at [facility], [address], [place], [place] [address]    ABGS No results for input(s): PHART, PO2ART, TCO2, HCO3 in the last 72 hours.  Invalid input(s): PCO2 CULTURES Recent Results (from the past 240 hour(s))  MRSA PCR Screening     Status: None   Collection Time: 10/08/18  9:00 PM  Result Value Ref Range Status   MRSA by PCR NEGATIVE NEGATIVE Final    Comment:        The GeneXpert MRSA Assay (FDA approved for NASAL specimens only), is one component of a comprehensive MRSA colonization surveillance program. It is not intended to diagnose MRSA infection nor to guide or monitor treatment for MRSA infections. Performed at [facility], [address] [place], [place] [address]    Studies/Results: No results found.  Medications:  Prior to Admission:  Medications Prior to Admission  Medication Sig Dispense Refill Last Dose  . Buprenorphine HCl-Naloxone HCl (SUBOXONE) 4-1 MG FILM Place 2 Film under the tongue See admin instructions. Dissolve 2 films under the tongue twice daily - May use one additional strip on 10 bad days a month   unknown  . clonazePAM (KLONOPIN) 0.5 MG tablet Take 0.5  mg by mouth daily as needed.   unknown  . lamoTRIgine (LAMICTAL) 100 MG tablet Take 1 tablet (100 mg total) by mouth 2 (two) times daily. 60 tablet 0 unknown  . levothyroxine (SYNTHROID, LEVOTHROID) 88 MCG tablet Take 1 tablet (88 mcg total) by mouth daily. 30 tablet 0 unknown  . lithium carbonate 300 MG capsule Take 1 capsule (300 mg total) by mouth 2 (two) times daily with a meal. 60 capsule 0 unknown  . NARCAN 4 MG/0.1ML LIQD nasal spray kit Place 4 mg into the nose See admin instructions. Administer a single spray of narcan in one nostril, repeat every 3 minutes as needed if no or minimal response  5 unknown  . OXYGEN Inhale 2.5 L into the lungs daily as needed (for breathing).    unknown  . pantoprazole (PROTONIX) 40 MG tablet TAKE 1 TABLET BY MOUTH TWICE DAILY BEFORE MEALS 60 tablet 3 unknown  . sertraline  (ZOLOFT) 100 MG tablet Take 100 mg by mouth daily.   unknown   Scheduled: . enoxaparin (LOVENOX) injection  40 mg Subcutaneous Q24H  . lamoTRIgine  100 mg Oral BID  . levothyroxine  88 mcg Oral Daily  . nicotine  21 mg Transdermal Daily  . pantoprazole  40 mg Oral BID AC  . potassium chloride  20 mEq Oral BID  . tamsulosin  0.4 mg Oral Daily   Continuous:  ZOX:[id number], LORazepam, ondansetron **OR** ondansetron (ZOFRAN) IV  Assesment: He was admitted with a fall and had a laceration of his scalp.  It is still too early to take the staples out.  He has alcoholism with ongoing alcohol abuse and had alcohol withdrawal which was treated.  He had urinary retention and I think that may have been from his treatment for alcohol withdrawal so I am going to see if we can take the catheter out now.  At baseline he has COPD with ongoing cigarette smoking but he says his breathing is doing okay  At baseline he has bipolar disorder and he was not taking his medication I suspect because he was intoxicated  He had marked hyponatremia on admission related to beer [place] mania  He had prolonged QT interval related to his electrolyte abnormalities and that has resolved  He has cirrhosis of the liver but does not seem to be having any issues at this point with hepatic encephalopathy Active Problems:   Cirrhosis (H[facility])   Hepatitis C   Hypothyroid   COPD (chronic obstructive pulmonary disease) ([place])   Fall   Bipolar disorder ([place])   H/O ETOH abuse   Hyponatremia   Prolonged QT interval    Plan: Discontinue Foley catheter.  Continue other treatments.  Work on discharge planning.  Apparently the motel that he was living at is paid for until this morning but he does not have any money to try to extend his stay.    LOS: 5 days   [name] 10/13/2018, 8:23 AM

## 2018-10-13 NOTE — TOC Progression Note (Signed)
Transition of Care [facility]) - Progression Note    Patient Details  Name: DAYTONA RETANA MRN: [id number] Date of Birth: [birth year]  Transition of Care [facility]) CM/SW Contact  Roda Shutters Margretta Sidle, RN Phone Number: 10/13/2018, 12:17 PM  Clinical Narrative:   CM contacted by Lynelle Smoke, pt's sister that lives in [place], she is trying to have pt come live with her. She has found out today pt was wrongfully given his last months check and used it all before the error was found. He will now have to repay this money going forward and his checks will be less than normal. Sister has already paid 2 weeks at a motel without knowing pt had money all along.   Pt has charges pending from his sister that lives in [place]. Tammy going to call pt's P.O. to see if pt is able to move to [place], if not she may ask that other sister drop charges.   Tammy to updated this CM as she finds out the details.  Barriers to Discharge: Barriers Unresolved (comment)(pt unsure where he will DC to at this time. family concerned he is not safe)  Expected Discharge Plan and Services Expected Discharge Plan: [place]       Living arrangements for the past 2 months: Hotel/Motel                           Social Determinants of Health (SDOH) Interventions    Readmission Risk Interventions No flowsheet data found.

## 2018-10-13 NOTE — Progress Notes (Signed)
Physical Therapy Treatment Patient Details Name: [name] MRN: [id number] DOB: Oct 18, 1960 Today's Date: 10/13/2018    History of Present Illness [name] is a 58 y.o. male with medical history significant of alcoholism, hepatitis C, cirrhosis, bipolar disorder, hypothyroidism, presents to the hospital after suffering a fall.  Patient complained of a laceration to his scalp.  He had slipped in the bathroom and fell, striking his head onto the railing.  At this time, patient has been medicated with Ativan and is unable to participate in history.  History is obtained per the medical record.  Patient admits to daily alcohol use.  He complains of mild chest discomfort to his anterior chest, but felt this was secondary to his fall.  He denies any loss of consciousness.  [facility] has [facility].    PT Comments    Pt supine in bed, friendly and willing to participate with therapy.  No reports of pain through session.  Used RW for stability with transfer training and gait.  No LOB episodes during gait training with use of RW, does present with decreased cadence.  EOS pt requested to return to bed, pt left in bed with call bell within reach and bed alarm set.     Follow Up Recommendations  Home health PT;Supervision - Intermittent     Equipment Recommendations       Recommendations for Other Services       Precautions / Restrictions Precautions Precautions: Fall    Mobility  Bed Mobility Overal bed mobility: Modified Independent       Supine to sit: Min guard     General bed mobility comments: increased time  Transfers Overall transfer level: Needs assistance   Transfers: Sit to/from Stand Sit to Stand: Min guard         General transfer comment: increased time, RW for stability upon standing  Ambulation/Gait Ambulation/Gait assistance: Supervision;Min guard Gait Distance (Feet): 150 Feet Assistive device: Rolling walker (2 wheeled) Gait  Pattern/deviations: Decreased step length - right;Decreased step length - left;Decreased stride length Gait velocity: decreased   General Gait Details: unsteady upon initial standing wiht use of RW to assist with balalnce, able to ambulate without LOB episodes with use or walker   Stairs             Wheelchair Mobility    Modified Rankin (Stroke Patients Only)       Balance                                            Cognition Arousal/Alertness: Awake/alert Behavior During Therapy: WFL for tasks assessed/performed Overall Cognitive Status: Within Functional Limits for tasks assessed                                        Exercises      General Comments        Pertinent Vitals/Pain Pain Assessment: No/denies pain    Home Living                      Prior Function            PT Goals (current goals can now be found in the care plan section)      Frequency  Min 3X/week      PT Plan Current plan remains appropriate    Co-evaluation              AM-PAC PT "6 Clicks" Mobility   Outcome Measure  Help needed turning from your back to your side while in a flat bed without using bedrails?: None Help needed moving from lying on your back to sitting on the side of a flat bed without using bedrails?: None Help needed moving to and from a bed to a chair (including a wheelchair)?: A Little Help needed standing up from a chair using your arms (e.g., wheelchair or bedside chair)?: A Little Help needed to walk in hospital room?: A Little Help needed climbing 3-5 steps with a railing? : A Lot 6 Click Score: 19    End of Session Equipment Utilized During Treatment: Gait belt Activity Tolerance: Patient tolerated treatment well;Patient limited by fatigue Patient left: in bed;with call bell/phone within reach;with bed alarm set(requested return to bed with head elevated) Nurse Communication: Mobility status PT  Visit Diagnosis: Unsteadiness on feet (R26.81);Other abnormalities of gait and mobility (R26.89);Muscle weakness (generalized) (M62.81)     Time: 6067-7034 PT Time Calculation (min) (ACUTE ONLY): 33 min  Charges:  $Gait Training: 8-22 mins $Therapeutic Activity: 8-22 mins                     [address], LPTA; CBIS [phone]  Aldona Lento 10/13/2018, 12:33 PM

## 2018-10-14 MED ORDER — CLONAZEPAM 0.5 MG PO TABS
0.5000 mg | ORAL_TABLET | Freq: Two times a day (BID) | ORAL | Status: DC | PRN
  Administered 2018-10-14 – 2018-10-15 (×4): 0.5 mg via ORAL
  Filled 2018-10-14 (×4): qty 1

## 2018-10-14 NOTE — Progress Notes (Signed)
Subjective: He says he has a headache.  He did not sleep well last night.  Case management note seen and appreciated.  Objective: Vital signs in last 24 hours: Temp:  [97.9 F (36.6 C)-98.9 F (37.2 C)] 98.9 F (37.2 C) (04/22 0602) Pulse Rate:  [80-99] 99 (04/22 0602) Resp:  [16-18] 16 (04/22 0602) BP: (104-112)/(56-71) 110/71 (04/22 0602) SpO2:  [94 %-99 %] 95 % (04/22 0602) Weight change:  Last BM Date: 10/13/18  Intake/Output from previous day: 04/21 0701 - 04/22 0700 In: 1200 [P.O.:1200] Out: 2500 [Urine:2500]  PHYSICAL EXAM General appearance: alert, cooperative and no distress Resp: rhonchi bilaterally Cardio: regular rate and rhythm, S1, S2 normal, no murmur, click, rub or gallop GI: soft, non-tender; bowel sounds normal; no masses,  no organomegaly Extremities: extremities normal, atraumatic, no cyanosis or edema  Lab Results:  No results found for this or any previous visit (from the past 48 hour(s)).  ABGS No results for input(s): PHART, PO2ART, TCO2, HCO3 in the last 72 hours.  Invalid input(s): PCO2 CULTURES Recent Results (from the past 240 hour(s))  MRSA PCR Screening     Status: None   Collection Time: 10/08/18  9:00 PM  Result Value Ref Range Status   MRSA by PCR NEGATIVE NEGATIVE Final    Comment:        The GeneXpert MRSA Assay (FDA approved for NASAL specimens only), is one component of a comprehensive MRSA colonization surveillance program. It is not intended to diagnose MRSA infection nor to guide or monitor treatment for MRSA infections. Performed at [facility], [address], [place], [place] [address]    Studies/Results: No results found.  Medications:  Prior to Admission:  Medications Prior to Admission  Medication Sig Dispense Refill Last Dose  . Buprenorphine HCl-Naloxone HCl (SUBOXONE) 4-1 MG FILM Place 2 Film under the tongue See admin instructions. Dissolve 2 films under the tongue twice daily - May use one additional  strip on 10 bad days a month   unknown  . clonazePAM (KLONOPIN) 0.5 MG tablet Take 0.5 mg by mouth daily as needed.   unknown  . lamoTRIgine (LAMICTAL) 100 MG tablet Take 1 tablet (100 mg total) by mouth 2 (two) times daily. 60 tablet 0 unknown  . levothyroxine (SYNTHROID, LEVOTHROID) 88 MCG tablet Take 1 tablet (88 mcg total) by mouth daily. 30 tablet 0 unknown  . lithium carbonate 300 MG capsule Take 1 capsule (300 mg total) by mouth 2 (two) times daily with a meal. 60 capsule 0 unknown  . NARCAN 4 MG/0.1ML LIQD nasal spray kit Place 4 mg into the nose See admin instructions. Administer a single spray of narcan in one nostril, repeat every 3 minutes as needed if no or minimal response  5 unknown  . OXYGEN Inhale 2.5 L into the lungs daily as needed (for breathing).    unknown  . pantoprazole (PROTONIX) 40 MG tablet TAKE 1 TABLET BY MOUTH TWICE DAILY BEFORE MEALS 60 tablet 3 unknown  . sertraline (ZOLOFT) 100 MG tablet Take 100 mg by mouth daily.   unknown   Scheduled: . enoxaparin (LOVENOX) injection  40 mg Subcutaneous Q24H  . lamoTRIgine  100 mg Oral BID  . levothyroxine  88 mcg Oral Daily  . nicotine  21 mg Transdermal Daily  . pantoprazole  40 mg Oral BID AC  . potassium chloride  20 mEq Oral BID  . tamsulosin  0.4 mg Oral Daily   Continuous:  TFT:DDUKGURKY, LORazepam, ondansetron **OR** ondansetron (ZOFRAN) IV  Assesment: He was admitted with significant hyponatremia from beer Poto mania.  This has resolved.  He also has alcoholism and had alcohol withdrawal which has been treated.  He has bipolar disease at baseline and is on medication for that.  He did not appear to be taking it at home.  He has hypothyroidism and his TSH was elevated but I think he was not taking his thyroid replacement either.  He had prolonged QT interval and that has resolved  He has cirrhosis of the liver but does not show any evidence now of hepatic encephalopathy.  He has COPD at baseline with  ongoing nicotine abuse Active Problems:   Cirrhosis (HCC)   Hepatitis C   Hypothyroid   COPD (chronic obstructive pulmonary disease) ([place])   Fall   Bipolar disorder ([place])   H/O ETOH abuse   Hyponatremia   Prolonged QT interval    Plan: Continue treatments.  Hopefully home with his sister soon    LOS: 6 days   Alonza Bogus 10/14/2018, 8:22 AM

## 2018-10-14 NOTE — TOC Progression Note (Addendum)
Transition of Care [facility]) - Progression Note    Patient Details  Name: BRODRIC SCHAUER MRN: [id number] Date of Birth: 01-Dec-1960  Transition of Care [facility]) CM/SW [place], [place] Phone Number: 10/14/2018, 1:22 PM  Clinical Narrative:   Pt with [facility] confirmed he is going to stay with sister in [place], [place], at d/c.  States she is coming to pick him up Friday PM.  Gave permission to call her.  I left a message for Ms Sharol Roussel at [phone], with the intent of telling her that she needs to a] apply for MCD in [place] if her intent is to have pt with her for an extended period of time, b] get him a PCP, and c] then ask for [facility] PT referral once PCP is secured.  Will confirm in this note when I hear back from sister.  Sister called back.  Confirmed plan to pcik up patient and take him back to [place] with her for "long term, if he follows the rules."  Stated she is waiting to hear back from PO for blessing for pt to leave state.    Expected Discharge Plan: [place] Barriers to Discharge: No Barriers Identified  Expected Discharge Plan and Services Expected Discharge Plan: [place] arrangements for the past 2 months: Hotel/Motel                 DME Arranged: N/A DME Agency: NA HH Arranged: NA HH Agency: NA   Social Determinants of Health (SDOH) Interventions    Readmission Risk Interventions No flowsheet data found.

## 2018-10-14 NOTE — Progress Notes (Signed)
Physical Therapy Treatment Patient Details Name: [name] MRN: [id number] DOB: [birth year] Today's Date: 10/14/2018    History of Present Illness [name] is a 58 y.o. male with medical history significant of alcoholism, hepatitis C, cirrhosis, bipolar disorder, hypothyroidism, presents to the hospital after suffering a fall.  Patient complained of a laceration to his scalp.  He had slipped in the bathroom and fell, striking his head onto the railing.  At this time, patient has been medicated with Ativan and is unable to participate in history.  History is obtained per the medical record.  Patient admits to daily alcohol use.  He complains of mild chest discomfort to his anterior chest, but felt this was secondary to his fall.  He denies any loss of consciousness.  He has been staying at [facility].    PT Comments    Pt friendly and willing to participate with therapy today.  Pt limited by fatigue with reports of difficulty sleeping last time as well as headache and pain on laceration/staples on scalp, RN aware and premedicated prior tx.  Pt presents with good stability with use of RW, no LOB episodes while walking.  A standing rest break while looking out windows and pt removed hand from RW and had 1 LOB episode required min A for safety.  EOS pt requested to return to bed, left sitting on EOB with bed alarm set and call bell within reach.  No reports of increased pain through session.      Follow Up Recommendations  Home health PT;Supervision - Intermittent     Equipment Recommendations       Recommendations for Other Services       Precautions / Restrictions Precautions Precautions: Fall Restrictions Weight Bearing Restrictions: No    Mobility  Bed Mobility Overal bed mobility: Modified Independent       Supine to sit: Min guard     General bed mobility comments: increased time  Transfers Overall transfer level: Modified independent   Transfers: Sit  to/from Stand Sit to Stand: Min guard         General transfer comment: increased time, RW for stability upon standing  Ambulation/Gait Ambulation/Gait assistance: Supervision;Min guard Gait Distance (Feet): 150 Feet Assistive device: Rolling walker (2 wheeled) Gait Pattern/deviations: Decreased step length - right;Decreased step length - left;Decreased stride length Gait velocity: decreased   General Gait Details: unsteady upon initial standing.  Standing rest break to look out window with 1 LOB with UE movements required min A for safety   Stairs             Wheelchair Mobility    Modified Rankin (Stroke Patients Only)       Balance                                            Cognition Arousal/Alertness: Awake/alert Behavior During Therapy: WFL for tasks assessed/performed Overall Cognitive Status: Within Functional Limits for tasks assessed                                        Exercises      General Comments        Pertinent Vitals/Pain Pain Score: 8  Pain Location: headache and site of staples/laceration Pain Descriptors / Indicators: Aching;Sore;Discomfort  Pain Intervention(s): Premedicated before session;Monitored during session;Limited activity within patient's tolerance    Home Living                      Prior Function            PT Goals (current goals can now be found in the care plan section)      Frequency    Min 3X/week      PT Plan Current plan remains appropriate    Co-evaluation              AM-PAC PT "6 Clicks" Mobility   Outcome Measure  Help needed turning from your back to your side while in a flat bed without using bedrails?: None Help needed moving from lying on your back to sitting on the side of a flat bed without using bedrails?: None Help needed moving to and from a bed to a chair (including a wheelchair)?: A Little Help needed standing up from a chair using  your arms (e.g., wheelchair or bedside chair)?: A Little Help needed to walk in hospital room?: A Little Help needed climbing 3-5 steps with a railing? : A Lot 6 Click Score: 19    End of Session Equipment Utilized During Treatment: Gait belt Activity Tolerance: Patient tolerated treatment well;Patient limited by fatigue Patient left: in bed;with call bell/phone within reach;with bed alarm set(requested to sit on EOB at EOS) Nurse Communication: Mobility status PT Visit Diagnosis: Unsteadiness on feet (R26.81);Other abnormalities of gait and mobility (R26.89);Muscle weakness (generalized) (M62.81)     Time: 2707-8675 PT Time Calculation (min) (ACUTE ONLY): 20 min  Charges:  $Gait Training: 8-22 mins                     [address] LPTA; CBIS [phone]  Aldona Lento 10/14/2018, 10:30 AM

## 2018-10-15 LAB — CREATININE, SERUM
Creatinine, Ser: 0.76 mg/dL (ref 0.61–1.24)
GFR calc Af Amer: >60 mL/min (ref >60)
GFR calc non Af Amer: >60 mL/min (ref >60)

## 2018-10-15 NOTE — Progress Notes (Signed)
Mobility Note  Patient Details  Name: [name] MRN: [id number] Date of Birth: 1960/11/25 Today's Date: 10/15/2018    Pt requested to go for walk.  Used RW and SBA x 385ft.  Pt able to ambulate safely with use of RW and no LOB episodes.    10/15/2018 1340-->1400 [name], LPTA; [place]  Aldona Lento 10/15/2018, 2:00 PM

## 2018-10-15 NOTE — Progress Notes (Signed)
Subjective: He says he feels okay.  He still did not sleep very well last night.  He has a headache from his head injury he has no other new complaints.  Objective: Vital signs in last 24 hours: Temp:  [98 F (36.7 C)-98.4 F (36.9 C)] 98 F (36.7 C) (04/23 0512) Pulse Rate:  [76-97] 76 (04/23 0512) Resp:  [16-18] 16 (04/23 0512) BP: (100-109)/(64-73) 104/73 (04/23 0512) SpO2:  [95 %-97 %] 97 % (04/23 0512) Weight change:  Last BM Date: 10/13/18  Intake/Output from previous day: 04/22 0701 - 04/23 0700 In: 600 [P.O.:600] Out: 1525 [Urine:1525]  PHYSICAL EXAM General appearance: alert, cooperative and no distress Resp: rhonchi bilaterally Cardio: regular rate and rhythm, S1, S2 normal, no murmur, click, rub or gallop GI: soft, non-tender; bowel sounds normal; no masses,  no organomegaly Extremities: extremities normal, atraumatic, no cyanosis or edema  Lab Results:  Results for orders placed or performed during the hospital encounter of 10/08/18 (from the past 48 hour(s))  Creatinine, serum     Status: None   Collection Time: 10/15/18  4:19 AM  Result Value Ref Range   Creatinine, Ser 0.76 0.61 - 1.24 mg/dL   GFR calc non Af Amer >60 >60 mL/min   GFR calc Af Amer >60 >60 mL/min    Comment: Performed at [facility], [address] [place], [place] [address]    ABGS No results for input(s): PHART, PO2ART, TCO2, HCO3 in the last 72 hours.  Invalid input(s): PCO2 CULTURES Recent Results (from the past 240 hour(s))  MRSA PCR Screening     Status: None   Collection Time: 10/08/18  9:00 PM  Result Value Ref Range Status   MRSA by PCR NEGATIVE NEGATIVE Final    Comment:        The GeneXpert MRSA Assay (FDA approved for NASAL specimens only), is one component of a comprehensive MRSA colonization surveillance program. It is not intended to diagnose MRSA infection nor to guide or monitor treatment for MRSA infections. Performed at [facility], [address],  [place], [place] [address]    Studies/Results: No results found.  Medications:  Prior to Admission:  Medications Prior to Admission  Medication Sig Dispense Refill Last Dose  . Buprenorphine HCl-Naloxone HCl (SUBOXONE) 4-1 MG FILM Place 2 Film under the tongue See admin instructions. Dissolve 2 films under the tongue twice daily - May use one additional strip on 10 bad days a month   unknown  . clonazePAM (KLONOPIN) 0.5 MG tablet Take 0.5 mg by mouth daily as needed.   unknown  . lamoTRIgine (LAMICTAL) 100 MG tablet Take 1 tablet (100 mg total) by mouth 2 (two) times daily. 60 tablet 0 unknown  . levothyroxine (SYNTHROID, LEVOTHROID) 88 MCG tablet Take 1 tablet (88 mcg total) by mouth daily. 30 tablet 0 unknown  . lithium carbonate 300 MG capsule Take 1 capsule (300 mg total) by mouth 2 (two) times daily with a meal. 60 capsule 0 unknown  . NARCAN 4 MG/0.1ML LIQD nasal spray kit Place 4 mg into the nose See admin instructions. Administer a single spray of narcan in one nostril, repeat every 3 minutes as needed if no or minimal response  5 unknown  . OXYGEN Inhale 2.5 L into the lungs daily as needed (for breathing).    unknown  . pantoprazole (PROTONIX) 40 MG tablet TAKE 1 TABLET BY MOUTH TWICE DAILY BEFORE MEALS 60 tablet 3 unknown  . sertraline (ZOLOFT) 100 MG tablet Take 100 mg  by mouth daily.   unknown   Scheduled: . enoxaparin (LOVENOX) injection  40 mg Subcutaneous Q24H  . lamoTRIgine  100 mg Oral BID  . levothyroxine  88 mcg Oral Daily  . nicotine  21 mg Transdermal Daily  . pantoprazole  40 mg Oral BID AC  . potassium chloride  20 mEq Oral BID  . tamsulosin  0.4 mg Oral Daily   Continuous:  QAS:[id number], ibuprofen, LORazepam, ondansetron **OR** ondansetron (ZOFRAN) IV  Assesment: He was admitted with hyponatremia related to beer Poto mania.  This has resolved.  He fell and had a laceration of his head which was stapled  He has alcoholism with ongoing alcohol abuse and had  alcohol withdrawal but that seems to have resolved.  At baseline he has bipolar disease and I do not think he was taking any of his medicines  He has cirrhosis of the liver which is currently asymptomatic  He has COPD with ongoing tobacco abuse nicotine patch on.  He had prolonged QT interval which has resolved Active Problems:   Cirrhosis (HCC)   Hepatitis C   Hypothyroid   COPD (chronic obstructive pulmonary disease) ([place])   Fall   Bipolar disorder ([place])   H/O ETOH abuse   Hyponatremia   Prolonged QT interval    Plan: Continue treatments.  We are working on discharge planning.  He has a complicated situation because he is apparently working with the Research officer, trade union here he has [facility] and plan is for him to go to [place] with his sister.    LOS: 7 days   [name] 10/15/2018, 8:08 AM

## 2018-10-15 NOTE — Progress Notes (Signed)
Physical Therapy Treatment Patient Details Name: [name] MRN: [id number] DOB: 03/25/1961 Today's Date: 10/15/2018    History of Present Illness [name] is a 58 y.o. male with medical history significant of alcoholism, hepatitis C, cirrhosis, bipolar disorder, hypothyroidism, presents to the hospital after suffering a fall.  Patient complained of a laceration to his scalp.  He had slipped in the bathroom and fell, striking his head onto the railing.  At this time, patient has been medicated with Ativan and is unable to participate in history.  History is obtained per the medical record.  Patient admits to daily alcohol use.  He complains of mild chest discomfort to his anterior chest, but felt this was secondary to his fall.  He denies any loss of consciousness.  [facility] has [facility]    PT Comments    Pt friendly and willing to participate.  RN in room as therapist entered and medication given.  Pt presents with increased independence with bed mobility and good mechanics with transfer training.  Able to increase cadence during gait, continued with RW for balance assistance.  No LOB episodes during session.  Pt excited with discussion on possible DC tomorrow, plans to live sister in [place] and did report some stairs in home.  Reviewed safe mechanics with stairs today, pt able to complete reciprocal pattern with 1 hand rail ascending and 2 descending, increased difficulty with descending stairs with increased handrail use and slow mechanics.  Able to complete stair safely.  EOS pt requested to return to bed.  No reports of increased pain through session.    Follow Up Recommendations  Home health PT;Supervision - Intermittent     Equipment Recommendations       Recommendations for Other Services       Precautions / Restrictions Precautions Precautions: Fall Restrictions Weight Bearing Restrictions: No    Mobility  Bed Mobility Overal bed mobility: Modified  Independent       Supine to sit: Min guard     General bed mobility comments: increased time  Transfers Overall transfer level: Modified independent   Transfers: Sit to/from Stand Sit to Stand: Supervision         General transfer comment: increased time, RW for stability upon standing  Ambulation/Gait Ambulation/Gait assistance: Supervision;Min guard Gait Distance (Feet): 150 Feet Assistive device: Rolling walker (2 wheeled) Gait Pattern/deviations: Decreased step length - right;Decreased step length - left;Decreased stride length Gait velocity: faster velocity   General Gait Details: no LOB episodes.  Presents with increased cadence   Stairs Stairs: Yes Stairs assistance: Supervision Stair Management: One rail Right;Two rails;Alternating pattern Number of Stairs: 8 General stair comments: Pt able to demonstrate reciprocal pattern stairs with 1 ascending and 2 descending.   Wheelchair Mobility    Modified Rankin (Stroke Patients Only)       Balance                                            Cognition Arousal/Alertness: Awake/alert Behavior During Therapy: WFL for tasks assessed/performed Overall Cognitive Status: Within Functional Limits for tasks assessed                                        Exercises      General  Comments        Pertinent Vitals/Pain Pain Score: 7  Pain Location: headache and site of staples/laceration Pain Descriptors / Indicators: Aching;Sore;Discomfort Pain Intervention(s): Premedicated before session;Monitored during session;Limited activity within patient's tolerance    Home Living                      Prior Function            PT Goals (current goals can now be found in the care plan section)      Frequency    Min 3X/week      PT Plan Current plan remains appropriate    Co-evaluation              AM-PAC PT "6 Clicks" Mobility   Outcome Measure  Help  needed turning from your back to your side while in a flat bed without using bedrails?: None Help needed moving from lying on your back to sitting on the side of a flat bed without using bedrails?: None Help needed moving to and from a bed to a chair (including a wheelchair)?: A Little Help needed standing up from a chair using your arms (e.g., wheelchair or bedside chair)?: A Little Help needed to walk in hospital room?: A Little Help needed climbing 3-5 steps with a railing? : A Lot 6 Click Score: 19    End of Session   Activity Tolerance: Patient tolerated treatment well;Patient limited by fatigue Patient left: in bed;with call bell/phone within reach;with bed alarm set(requested to sit on EOB) Nurse Communication: Mobility status PT Visit Diagnosis: Unsteadiness on feet (R26.81);Other abnormalities of gait and mobility (R26.89);Muscle weakness (generalized) (M62.81)     Time: 2248-2500 PT Time Calculation (min) (ACUTE ONLY): 15 min  Charges:  $Therapeutic Activity: 8-22 mins                     [address], LPTA; [facility] [phone]  Aldona Lento 10/15/2018, 10:56 AM

## 2018-10-16 DIAGNOSIS — R338 Other retention of urine: Secondary | ICD-10-CM | POA: Diagnosis present

## 2018-10-16 MED ORDER — IBUPROFEN 400 MG PO TABS: 400.0000 mg | ORAL_TABLET | Freq: Four times a day (QID) | ORAL | 0 refills | Status: AC | PRN

## 2018-10-16 MED ORDER — CLONAZEPAM 0.5 MG PO TABS: 0.5000 mg | ORAL_TABLET | Freq: Two times a day (BID) | ORAL | 0 refills | Status: AC | PRN

## 2018-10-16 MED ORDER — TAMSULOSIN HCL 0.4 MG PO CAPS: 0.4000 mg | ORAL_CAPSULE | Freq: Every day | ORAL | 2 refills | Status: AC

## 2018-10-16 MED ORDER — NICOTINE 21 MG/24HR TD PT24: 21.0000 mg | MEDICATED_PATCH | Freq: Every day | TRANSDERMAL | 0 refills | Status: AC

## 2018-10-16 MED ORDER — KETOROLAC TROMETHAMINE 30 MG/ML IJ SOLN
30.0000 mg | Freq: Once | INTRAMUSCULAR | Status: AC
  Administered 2018-10-16: 30 mg via INTRAVENOUS
  Filled 2018-10-16: qty 1

## 2018-10-16 NOTE — Progress Notes (Signed)
Reviewed AVS with patient who verbalized understanding. Patient to be transported to lobby via wheelchair, patient to transported to home in [place] via his sister. 10 staples removed per MD order.

## 2018-10-16 NOTE — TOC Transition Note (Signed)
Transition of Care [facility]) - CM/SW Discharge Note   Patient Details  Name: [name] MRN: [id number] Date of Birth: 15-May-1961  Transition of Care [facility]) CM/SW Contact:  Sherald Barge, RN Phone Number: 10/16/2018, 1:44 PM   Clinical Narrative:   Sister coming from [place] today to pick pt up. Pt has cane. Will not receive HH PT initially d/t need to establish PCP and get medicaid transferred to [facility]. Sister aware all of that needs to occur. Sister says today pt needs COVID testing to "enter the state of [place]". MD has ordered. No further needs noted nor communicated by pt or sister.   Final next level of care: Home/Self Care Barriers to Discharge: No Barriers Identified   Patient Goals and CMS Choice Patient states their goals for this hospitalization and ongoing recovery are:: "Go stay with my sister in [place], [place]." CMS Medicare.gov Compare Post Acute Care list provided to:: Other (Comment Required)(No list as pt is leaving the state, MCD is insurance) Choice offered to / list presented to : NA  Readmission Risk Interventions Readmission Risk Prevention Plan 10/16/2018  Transportation Screening Complete  PCP or Specialist Appt within 5-7 Days (No Data)  Home Care Screening Complete  Medication Review (RN CM) Complete  Some recent data might be hidden

## 2018-10-16 NOTE — Progress Notes (Signed)
Physical Therapy Treatment Patient Details Name: [name] MRN: [id number] DOB: 1961/03/25 Today's Date: 10/16/2018    History of Present Illness [name] is a 58 y.o. male with medical history significant of alcoholism, hepatitis C, cirrhosis, bipolar disorder, hypothyroidism, presents to the hospital after suffering a fall.  Patient complained of a laceration to his scalp.  He had slipped in the bathroom and fell, striking his head onto the railing.  At this time, patient has been medicated with Ativan and is unable to participate in history.  History is obtained per the medical record.  Patient admits to daily alcohol use.  He complains of mild chest discomfort to his anterior chest, but felt this was secondary to his fall.  He denies any loss of consciousness.  He has been staying at [facility].    PT Comments    Pt presents with increased independence with transfer training and able to ambulate with LRAD.  Used SPC for beginning of session, pt able to carry cane and walk with no AD though did use for incline slope to assist due to weakness and increased demand.  Able to complete reciprocal pattern stairs with only 1 handrail use.  Pt's gait presents with increased velocity and no LOB.    Follow Up Recommendations  Home health PT;Supervision - Intermittent     Equipment Recommendations       Recommendations for Other Services       Precautions / Restrictions Precautions Precautions: Fall    Mobility  Bed Mobility Overal bed mobility: Independent             General bed mobility comments: pt sitting on EOB upon entrance  Transfers Overall transfer level: Modified independent   Transfers: Sit to/from Stand Sit to Stand: Supervision         General transfer comment: Presents with improved stability and balance, able to stand without AD safely  Ambulation/Gait Ambulation/Gait assistance: Supervision;Min guard Gait Distance (Feet): 150 Feet Assistive  device: None;Straight cane Gait Pattern/deviations: Decreased stride length Gait velocity: WNL   General Gait Details: used SPC for incline slope.  Able to ambulate with no AD, no LOB and increased cadence   Stairs Stairs: Yes Stairs assistance: Supervision Stair Management: One rail Right Number of Stairs: 8 General stair comments: Able to demonstrate reciprocal pattern stairs ascending and descending with 1 handrail use   Wheelchair Mobility    Modified Rankin (Stroke Patients Only)       Balance                                            Cognition Arousal/Alertness: Awake/alert Behavior During Therapy: WFL for tasks assessed/performed Overall Cognitive Status: Within Functional Limits for tasks assessed                                        Exercises      General Comments        Pertinent Vitals/Pain Pain Score: 8  Pain Location: headache and site of staples/laceration Pain Descriptors / Indicators: Aching;Sore;Discomfort Pain Intervention(s): Premedicated before session;Monitored during session;Limited activity within patient's tolerance    Home Living                      Prior  Function            PT Goals (current goals can now be found in the care plan section)      Frequency    Min 3X/week      PT Plan Current plan remains appropriate    Co-evaluation              AM-PAC PT "6 Clicks" Mobility   Outcome Measure  Help needed turning from your back to your side while in a flat bed without using bedrails?: None Help needed moving from lying on your back to sitting on the side of a flat bed without using bedrails?: None Help needed moving to and from a bed to a chair (including a wheelchair)?: A Little Help needed standing up from a chair using your arms (e.g., wheelchair or bedside chair)?: A Little Help needed to walk in hospital room?: A Little Help needed climbing 3-5 steps with a  railing? : A Little 6 Click Score: 20    End of Session Equipment Utilized During Treatment: Gait belt Activity Tolerance: Patient tolerated treatment well;Patient limited by fatigue Patient left: in bed;with call bell/phone within reach;with bed alarm set(requested to sit on EOB) Nurse Communication: Mobility status PT Visit Diagnosis: Unsteadiness on feet (R26.81);Other abnormalities of gait and mobility (R26.89);Muscle weakness (generalized) (M62.81)     Time: 1040-1100 PT Time Calculation (min) (ACUTE ONLY): 20 min  Charges:  $Gait Training: 8-22 mins                     [address], [place]; [place]   Aldona Lento 10/16/2018, 12:15 PM

## 2018-10-16 NOTE — Progress Notes (Signed)
Subjective: He says he feels okay.  His sister is apparently coming to get him today so I will have him prepared for discharge.  Objective: Vital signs in last 24 hours: Temp:  [98.1 F (36.7 C)-98.3 F (36.8 C)] 98.1 F (36.7 C) (04/24 0604) Pulse Rate:  [87-100] 87 (04/24 0604) Resp:  [16-19] 16 (04/24 0604) BP: (109-129)/(65-81) 109/65 (04/24 0604) SpO2:  [96 %-100 %] 96 % (04/24 0604) Weight change:  Last BM Date: 10/14/18  Intake/Output from previous day: 04/23 0701 - 04/24 0700 In: 360 [P.O.:360] Out: 1750 [Urine:1750]  PHYSICAL EXAM General appearance: alert, cooperative and no distress Resp: clear to auscultation bilaterally Cardio: regular rate and rhythm, S1, S2 normal, no murmur, click, rub or gallop GI: soft, non-tender; bowel sounds normal; no masses,  no organomegaly Extremities: extremities normal, atraumatic, no cyanosis or edema  Lab Results:  Results for orders placed or performed during the hospital encounter of 10/08/18 (from the past 48 hour(s))  Creatinine, serum     Status: None   Collection Time: 10/15/18  4:19 AM  Result Value Ref Range   Creatinine, Ser 0.76 0.61 - 1.24 mg/dL   GFR calc non Af Amer >60 >60 mL/min   GFR calc Af Amer >60 >60 mL/min    Comment: Performed at [facility], [address], [place], [place] [address]    ABGS No results for input(s): PHART, PO2ART, TCO2, HCO3 in the last 72 hours.  Invalid input(s): PCO2 CULTURES Recent Results (from the past 240 hour(s))  MRSA PCR Screening     Status: None   Collection Time: 10/08/18  9:00 PM  Result Value Ref Range Status   MRSA by PCR NEGATIVE NEGATIVE Final    Comment:        The GeneXpert MRSA Assay (FDA approved for NASAL specimens only), is one component of a comprehensive MRSA colonization surveillance program. It is not intended to diagnose MRSA infection nor to guide or monitor treatment for MRSA infections. Performed at [facility], [address]  [place], [place] [address]    Studies/Results: No results found.  Medications:  Prior to Admission:  Medications Prior to Admission  Medication Sig Dispense Refill Last Dose  . Buprenorphine HCl-Naloxone HCl (SUBOXONE) 4-1 MG FILM Place 2 Film under the tongue See admin instructions. Dissolve 2 films under the tongue twice daily - May use one additional strip on 10 bad days a month   unknown  . clonazePAM (KLONOPIN) 0.5 MG tablet Take 0.5 mg by mouth daily as needed.   unknown  . lamoTRIgine (LAMICTAL) 100 MG tablet Take 1 tablet (100 mg total) by mouth 2 (two) times daily. 60 tablet 0 unknown  . levothyroxine (SYNTHROID, LEVOTHROID) 88 MCG tablet Take 1 tablet (88 mcg total) by mouth daily. 30 tablet 0 unknown  . lithium carbonate 300 MG capsule Take 1 capsule (300 mg total) by mouth 2 (two) times daily with a meal. 60 capsule 0 unknown  . NARCAN 4 MG/0.1ML LIQD nasal spray kit Place 4 mg into the nose See admin instructions. Administer a single spray of narcan in one nostril, repeat every 3 minutes as needed if no or minimal response  5 unknown  . OXYGEN Inhale 2.5 L into the lungs daily as needed (for breathing).    unknown  . pantoprazole (PROTONIX) 40 MG tablet TAKE 1 TABLET BY MOUTH TWICE DAILY BEFORE MEALS 60 tablet 3 unknown  . sertraline (ZOLOFT) 100 MG tablet Take 100 mg by mouth daily.  unknown   Scheduled: . enoxaparin (LOVENOX) injection  40 mg Subcutaneous Q24H  . lamoTRIgine  100 mg Oral BID  . levothyroxine  88 mcg Oral Daily  . nicotine  21 mg Transdermal Daily  . pantoprazole  40 mg Oral BID AC  . potassium chloride  20 mEq Oral BID  . tamsulosin  0.4 mg Oral Daily   Continuous:  OAD:[id number], ibuprofen, LORazepam, ondansetron **OR** ondansetron (ZOFRAN) IV  Assesment: He was admitted with marked hyponatremia related to his alcoholism and alcohol use.  This has resolved.  He has alcoholism and was having alcohol withdrawal which has been treated  He has cirrhosis  of the liver which does not appear to be symptomatic at this point  He has COPD but he has stopped smoking he says in February  He has hypothyroidism and his TSH was high but I think that is because he was not taking his medication.  He has a head laceration and presuming he is going home with his sister staples will be removed today  He had prolonged QT interval which has resolved  He has bipolar disease and he was not taking his medications prior to admission Active Problems:   Cirrhosis ([place])   Hepatitis C   Hypothyroid   COPD (chronic obstructive pulmonary disease) ([place])   Fall   Bipolar disorder ([place])   H/O ETOH abuse   Hyponatremia   Prolonged QT interval    Plan: Potential discharge later today    LOS: 8 days   Alonza Bogus 10/16/2018, 8:38 AM

## 2018-10-16 NOTE — Discharge Summary (Signed)
Physician Discharge Summary  Patient ID: [name] MRN: [id number] DOB/AGE: [birth year] 58 y.o. Primary Care Physician:Dhiya Smits, Percell Miller, MD Admit date: 10/08/2018 Discharge date: 10/16/2018    Discharge Diagnoses:   Active Problems:   Cirrhosis ([place])   Hepatitis C   Hypothyroid   COPD (chronic obstructive pulmonary disease) ([place])   Fall   Bipolar disorder ([place])   H/O ETOH abuse   Hyponatremia   Prolonged QT interval   Acute urinary retention   Allergies as of 10/16/2018      Reactions   Penicillins Anaphylaxis   Has patient had a PCN reaction causing immediate rash, facial/tongue/throat swelling, SOB or lightheadedness with hypotension: Yes Has patient had a PCN reaction causing severe rash involving mucus membranes or skin necrosis:No Has patient had a PCN reaction that required hospitalization:No Has patient had a PCN reaction occurring within the last 10 years: No If all of the above answers are "NO", then may proceed with Cephalosporin use.   Vancomycin    redman syndrome   Codeine Hives   Penicillins Hives   Has patient had a PCN reaction causing immediate rash, facial/tongue/throat swelling, SOB or lightheadedness with hypotension: Yes Has patient had a PCN reaction causing severe rash involving mucus membranes or skin necrosis: No Has patient had a PCN reaction that required hospitalization: No Has patient had a PCN reaction occurring within the last 10 years: No If all of the above answers are "NO", then may proceed with Cephalosporin use.   Tylenol [acetaminophen] Other (See Comments)   Patient has hepatitis      Medication List    STOP taking these medications   lithium carbonate 300 MG capsule   OXYGEN   Suboxone 4-1 MG Film Generic drug:  Buprenorphine HCl-Naloxone HCl     TAKE these medications   clonazePAM 0.5 MG tablet Commonly known as:  KLONOPIN Take 1 tablet (0.5 mg total) by mouth 2 (two) times daily as needed (Anxiety). What changed:     when to take this  reasons to take this   ibuprofen 400 MG tablet Commonly known as:  ADVIL Take 1 tablet (400 mg total) by mouth every 6 (six) hours as needed for headache or mild pain.   lamoTRIgine 100 MG tablet Commonly known as:  LAMICTAL Take 1 tablet (100 mg total) by mouth 2 (two) times daily.   levothyroxine 88 MCG tablet Commonly known as:  SYNTHROID Take 1 tablet (88 mcg total) by mouth daily.   Narcan 4 MG/0.1ML Liqd nasal spray kit Generic drug:  naloxone Place 4 mg into the nose See admin instructions. Administer a single spray of narcan in one nostril, repeat every 3 minutes as needed if no or minimal response   nicotine 21 mg/24hr patch Commonly known as:  NICODERM CQ - dosed in mg/24 hours Place 1 patch (21 mg total) onto the skin daily.   pantoprazole 40 MG tablet Commonly known as:  PROTONIX TAKE 1 TABLET BY MOUTH TWICE DAILY BEFORE MEALS   sertraline 100 MG tablet Commonly known as:  ZOLOFT Take 100 mg by mouth daily.   tamsulosin 0.4 MG Caps capsule Commonly known as:  FLOMAX Take 1 capsule (0.4 mg total) by mouth daily.       Discharged Condition: Improved    Consults: None  Significant Diagnostic Studies: Dg Chest 1 View  Result Date: 10/08/2018 CLINICAL DATA:  ETOH, fall, chest pain EXAM: CHEST  1 VIEW COMPARISON:  12/16/2017 FINDINGS: The heart size and mediastinal contours are within  normal limits. Both lungs are clear. The visualized skeletal structures are unremarkable. IMPRESSION: No acute abnormality of the lungs in AP projection. Electronically Signed   By: Eddie Candle M.D.   On: 10/08/2018 10:29   Ct Head Wo Contrast  Result Date: 10/08/2018 CLINICAL DATA:  Fall. EXAM: CT HEAD WITHOUT CONTRAST CT CERVICAL SPINE WITHOUT CONTRAST TECHNIQUE: Multidetector CT imaging of the head and cervical spine was performed following the standard protocol without intravenous contrast. Multiplanar CT image reconstructions of the cervical spine  were also generated. COMPARISON:  CT head and cervical spine dated December 16, 2017. FINDINGS: CT HEAD FINDINGS Brain: No evidence of acute infarction, hemorrhage, hydrocephalus, extra-axial collection or mass lesion/mass effect. Stable mild atrophy slightly asymmetric to the left hemisphere. Stable mild chronic microvascular ischemic changes. Vascular: Atherosclerotic vascular calcification of the carotid siphons. No hyperdense vessel. Skull: Normal. Negative for fracture or focal lesion. Sinuses/Orbits: Mild bilateral paranasal sinus mucosal thickening, improved when compared to prior study. No air-fluid levels. The orbits are unremarkable. Other: High right-sided scalp laceration at the vertex status post stapling. CT CERVICAL SPINE FINDINGS Alignment: Normal. Skull base and vertebrae: No acute fracture. No primary bone lesion or focal pathologic process. Soft tissues and spinal canal: No prevertebral fluid or swelling. No visible canal hematoma. Disc levels: Unchanged mild disc height loss at C5-C6 and C6-C7. Unchanged mild-to-moderate right facet arthropathy at C2-C3. Upper chest: Unchanged emphysema. Other: None. IMPRESSION: 1. No acute intracranial abnormality. High right-sided scalp laceration at the vertex status post stapling. 2.  No acute cervical spine fracture. 3.  Emphysema (ICD10-J43.9). Electronically Signed   By: Titus Dubin M.D.   On: 10/08/2018 11:04   Ct Cervical Spine Wo Contrast  Result Date: 10/08/2018 CLINICAL DATA:  Fall. EXAM: CT HEAD WITHOUT CONTRAST CT CERVICAL SPINE WITHOUT CONTRAST TECHNIQUE: Multidetector CT imaging of the head and cervical spine was performed following the standard protocol without intravenous contrast. Multiplanar CT image reconstructions of the cervical spine were also generated. COMPARISON:  CT head and cervical spine dated December 16, 2017. FINDINGS: CT HEAD FINDINGS Brain: No evidence of acute infarction, hemorrhage, hydrocephalus, extra-axial collection or  mass lesion/mass effect. Stable mild atrophy slightly asymmetric to the left hemisphere. Stable mild chronic microvascular ischemic changes. Vascular: Atherosclerotic vascular calcification of the carotid siphons. No hyperdense vessel. Skull: Normal. Negative for fracture or focal lesion. Sinuses/Orbits: Mild bilateral paranasal sinus mucosal thickening, improved when compared to prior study. No air-fluid levels. The orbits are unremarkable. Other: High right-sided scalp laceration at the vertex status post stapling. CT CERVICAL SPINE FINDINGS Alignment: Normal. Skull base and vertebrae: No acute fracture. No primary bone lesion or focal pathologic process. Soft tissues and spinal canal: No prevertebral fluid or swelling. No visible canal hematoma. Disc levels: Unchanged mild disc height loss at C5-C6 and C6-C7. Unchanged mild-to-moderate right facet arthropathy at C2-C3. Upper chest: Unchanged emphysema. Other: None. IMPRESSION: 1. No acute intracranial abnormality. High right-sided scalp laceration at the vertex status post stapling. 2.  No acute cervical spine fracture. 3.  Emphysema (ICD10-J43.9). Electronically Signed   By: Titus Dubin M.D.   On: 10/08/2018 11:04    Lab Results: Basic Metabolic Panel: Recent Labs    10/15/18 0419  CREATININE 0.76   Liver Function Tests: No results for input(s): AST, ALT, ALKPHOS, BILITOT, PROT, ALBUMIN in the last 72 hours.   CBC: No results for input(s): WBC, NEUTROABS, HGB, HCT, MCV, PLT in the last 72 hours.  Recent Results (from the past 240 hour(s))  MRSA PCR Screening     Status: None   Collection Time: 10/08/18  9:00 PM  Result Value Ref Range Status   MRSA by PCR NEGATIVE NEGATIVE Final    Comment:        The GeneXpert MRSA Assay (FDA approved for NASAL specimens only), is one component of a comprehensive MRSA colonization surveillance program. It is not intended to diagnose MRSA infection nor to guide or monitor treatment for MRSA  infections. Performed at [facility], [address], [place], [place] [address]      Hospital Course: This is a 58 year old who came to the hospital because of altered mental status.  He has a long known history of bipolar disease and chronic alcoholism with ongoing alcohol abuse and he was found to be confused and brought to the emergency department.  He was found to have a very low sodium and this was felt to be related to his alcohol abuse.  He was treated with saline with a short course of hypertonic saline and his sodium has come up into the 130s.  He was hypokalemic initially and that has resolved.  He had alcohol withdrawal which was treated.  He had a laceration on his scalp which was stapled in the emergency department.  He did not have any other deeper head injury.  He was continued on IV fluids had prolonged QT interval which resolved with correction of his electrolyte imbalances.  He had trouble with urinary retention and had Foley catheter.  This was felt to be related to his alcohol withdrawal.  By the time of discharge she was much improved.  He had physical therapy evaluation and it was felt that he did not need placement.  There was difficulty finding a discharge venue.  He had been living in a hotel and apparently does not have any money now.  His sister who lives in [place] has agreed to take him home with her presuming we can get arrangements made with his case officer for him to move.  Foley catheter was removed and staples were removed before discharge  Discharge Exam: Blood pressure 109/65, pulse 87, temperature 98.1 F (36.7 C), temperature source Oral, resp. rate 16, height '5\' 7"'$  (1.702 m), weight 57.2 kg, SpO2 96 %. He is awake and alert.  Chest is clear.  Heart is regular.  Disposition: Home with his sister      Signed: Alonza Bogus   10/16/2018, 8:53 AM

## 2018-10-18 LAB — NOVEL CORONAVIRUS, NAA (HOSP ORDER, SEND-OUT TO REF LAB; TAT 18-24 HRS): SARS-CoV-2, NAA: NOT DETECTED

## 2023-02-06 ENCOUNTER — Encounter (INDEPENDENT_AMBULATORY_CARE_PROVIDER_SITE_OTHER): Payer: Self-pay | Admitting: *Deleted
